# Patient Record
Sex: Female | Born: 1935 | Race: White | Hispanic: No | Smoking: Never smoker
Health system: Southern US, Community
[De-identification: ages and names within clinical notes are randomized; demographics above are authoritative.]

## PROBLEM LIST (undated history)

## (undated) DIAGNOSIS — M1990 Unspecified osteoarthritis, unspecified site: Secondary | ICD-10-CM

## (undated) DIAGNOSIS — K5790 Diverticulosis of intestine, part unspecified, without perforation or abscess without bleeding: Secondary | ICD-10-CM

## (undated) DIAGNOSIS — I10 Essential (primary) hypertension: Secondary | ICD-10-CM

## (undated) DIAGNOSIS — M810 Age-related osteoporosis without current pathological fracture: Secondary | ICD-10-CM

## (undated) DIAGNOSIS — E785 Hyperlipidemia, unspecified: Secondary | ICD-10-CM

## (undated) DIAGNOSIS — K635 Polyp of colon: Secondary | ICD-10-CM

## (undated) HISTORY — DX: Age-related osteoporosis without current pathological fracture: M81.0

## (undated) HISTORY — DX: Polyp of colon: K63.5

## (undated) HISTORY — DX: Essential (primary) hypertension: I10

## (undated) HISTORY — PX: HEMORRHOID SURGERY: SHX153

## (undated) HISTORY — DX: Unspecified osteoarthritis, unspecified site: M19.90

## (undated) HISTORY — DX: Hyperlipidemia, unspecified: E78.5

## (undated) HISTORY — PX: TOTAL ABDOMINAL HYSTERECTOMY: SHX209

## (undated) HISTORY — PX: HEMORROIDECTOMY: SUR656

## (undated) HISTORY — PX: CARPAL TUNNEL RELEASE: SHX101

## (undated) HISTORY — DX: Diverticulosis of intestine, part unspecified, without perforation or abscess without bleeding: K57.90

---

## 1997-08-25 ENCOUNTER — Other Ambulatory Visit: Admission: RE | Admit: 1997-08-25 | Discharge: 1997-08-25 | Payer: Self-pay | Admitting: Family Medicine

## 1999-07-02 ENCOUNTER — Encounter: Payer: Self-pay | Admitting: Family Medicine

## 1999-07-02 ENCOUNTER — Encounter: Admission: RE | Admit: 1999-07-02 | Discharge: 1999-07-02 | Payer: Self-pay | Admitting: Obstetrics and Gynecology

## 2000-02-23 ENCOUNTER — Encounter: Payer: Self-pay | Admitting: Family Medicine

## 2000-02-23 ENCOUNTER — Encounter: Admission: RE | Admit: 2000-02-23 | Discharge: 2000-02-23 | Payer: Self-pay | Admitting: *Deleted

## 2000-03-10 ENCOUNTER — Encounter (INDEPENDENT_AMBULATORY_CARE_PROVIDER_SITE_OTHER): Payer: Self-pay | Admitting: Specialist

## 2000-03-10 ENCOUNTER — Ambulatory Visit (HOSPITAL_BASED_OUTPATIENT_CLINIC_OR_DEPARTMENT_OTHER): Admission: RE | Admit: 2000-03-10 | Discharge: 2000-03-10 | Payer: Self-pay | Admitting: General Surgery

## 2001-01-02 ENCOUNTER — Other Ambulatory Visit: Admission: RE | Admit: 2001-01-02 | Discharge: 2001-01-02 | Payer: Self-pay | Admitting: Family Medicine

## 2001-02-09 ENCOUNTER — Encounter: Payer: Self-pay | Admitting: Family Medicine

## 2001-02-09 ENCOUNTER — Encounter: Admission: RE | Admit: 2001-02-09 | Discharge: 2001-02-09 | Payer: Self-pay | Admitting: Family Medicine

## 2002-02-11 ENCOUNTER — Encounter: Admission: RE | Admit: 2002-02-11 | Discharge: 2002-02-11 | Payer: Self-pay | Admitting: Family Medicine

## 2002-02-11 ENCOUNTER — Encounter: Payer: Self-pay | Admitting: Family Medicine

## 2003-03-06 ENCOUNTER — Encounter: Admission: RE | Admit: 2003-03-06 | Discharge: 2003-03-06 | Payer: Self-pay | Admitting: Family Medicine

## 2004-03-10 ENCOUNTER — Ambulatory Visit (HOSPITAL_COMMUNITY): Admission: RE | Admit: 2004-03-10 | Discharge: 2004-03-10 | Payer: Self-pay | Admitting: Family Medicine

## 2005-03-21 ENCOUNTER — Ambulatory Visit (HOSPITAL_COMMUNITY): Admission: RE | Admit: 2005-03-21 | Discharge: 2005-03-21 | Payer: Self-pay | Admitting: Family Medicine

## 2005-09-02 ENCOUNTER — Ambulatory Visit: Payer: Self-pay | Admitting: Internal Medicine

## 2005-10-06 ENCOUNTER — Encounter (INDEPENDENT_AMBULATORY_CARE_PROVIDER_SITE_OTHER): Payer: Self-pay | Admitting: Specialist

## 2005-10-06 ENCOUNTER — Ambulatory Visit: Payer: Self-pay | Admitting: Internal Medicine

## 2006-04-11 ENCOUNTER — Ambulatory Visit (HOSPITAL_COMMUNITY): Admission: RE | Admit: 2006-04-11 | Discharge: 2006-04-11 | Payer: Self-pay | Admitting: Family Medicine

## 2006-05-04 ENCOUNTER — Ambulatory Visit (HOSPITAL_COMMUNITY): Admission: RE | Admit: 2006-05-04 | Discharge: 2006-05-04 | Payer: Self-pay | Admitting: Ophthalmology

## 2006-10-19 ENCOUNTER — Ambulatory Visit (HOSPITAL_COMMUNITY): Admission: RE | Admit: 2006-10-19 | Discharge: 2006-10-19 | Payer: Self-pay | Admitting: Ophthalmology

## 2007-04-12 ENCOUNTER — Ambulatory Visit (HOSPITAL_COMMUNITY): Admission: RE | Admit: 2007-04-12 | Discharge: 2007-04-12 | Payer: Self-pay | Admitting: Family Medicine

## 2008-04-29 ENCOUNTER — Ambulatory Visit (HOSPITAL_COMMUNITY): Admission: RE | Admit: 2008-04-29 | Discharge: 2008-04-29 | Payer: Self-pay | Admitting: Family Medicine

## 2008-05-01 ENCOUNTER — Ambulatory Visit (HOSPITAL_COMMUNITY): Admission: RE | Admit: 2008-05-01 | Discharge: 2008-05-01 | Payer: Self-pay | Admitting: Family Medicine

## 2009-05-04 ENCOUNTER — Ambulatory Visit (HOSPITAL_COMMUNITY): Admission: RE | Admit: 2009-05-04 | Discharge: 2009-05-04 | Payer: Self-pay | Admitting: Family Medicine

## 2010-05-18 ENCOUNTER — Other Ambulatory Visit: Payer: Self-pay | Admitting: Family Medicine

## 2010-05-18 DIAGNOSIS — Z139 Encounter for screening, unspecified: Secondary | ICD-10-CM

## 2010-06-07 ENCOUNTER — Ambulatory Visit (HOSPITAL_COMMUNITY)
Admission: RE | Admit: 2010-06-07 | Discharge: 2010-06-07 | Disposition: A | Discharge status: Home / Self Care | Payer: Medicare Other | Source: Ambulatory Visit | Attending: Family Medicine | Admitting: Family Medicine

## 2010-06-07 DIAGNOSIS — Z1231 Encounter for screening mammogram for malignant neoplasm of breast: Secondary | ICD-10-CM | POA: Insufficient documentation

## 2010-06-07 DIAGNOSIS — Z139 Encounter for screening, unspecified: Secondary | ICD-10-CM

## 2010-06-18 NOTE — Assessment & Plan Note (Signed)
[facility]                           GASTROENTEROLOGY OFFICE NOTE   NAME:Wendy Robles, Wendy Robles                      MRN:          [id number]  DATE:09/02/2005                            DOB:          [birth year]    CHIEF COMPLAINT:  History of colon polyps, constipation.   ASSESSMENT:  Wendy Robles is a lady that I found an adenomatous polyp and an  inflammatory polyp on a colonoscopy in 2003.  Originally I had recommended a  follow-up colonoscopy in 3 years, which was current recommendation for that  time, but subsequently one polyp only would mean a follow-up in 5 years so I  had changed that, but we had neglected to notify her.  I explained to her  how one of the polyps was inflammatory, I could not remove it but it was  biopsied, and she is reassured about this at this point.   She does have change in bowel habits.  She suffered with chronic  constipation off and on, but things seem to be worse.  It may be her  calcium, it may be her fish oil.  She is on Metamucil and stool softeners  without significant benefit.   PLAN:  We are going to go ahead and proceed with a colonoscopy because of  the change in bowel habits and the history of polyps.  She understands the  risks, benefits, and indication and agrees to proceed.  If it is negative  for polyps, given her age she might need another routine colonoscopy follow-  up depending on her clinical scenario and history at the time.   History as above.  No bleeding.  She has had small, hard stools frequently  that are a problem and make her miserable.  She is on a little bit of  Metamucil.  I have recommended Miralax to her as well, I believe.  Medications are listed and reviewed in the chart.  Past medical history is  reviewed and unchanged from my note of March 2004.   I appreciate the opportunity to care for this patient.                                   Iva Boop, MD, [facility]   CEG/MedQ  DD:   09/02/2005  DT:  09/03/2005  Job #:  [id number]   cc:   Magnus Sinning. Rice, MD

## 2010-06-18 NOTE — Op Note (Signed)
[place]. [facility]  Patient:    Wendy Robles, Wendy Robles                        MRN: [id number] Proc. Date: 03/10/00 Adm. Date:  [id number] Disc. Date: [id number] Attending:  Janalyn Rouse CC:         Montey Hora, M.D.   Operative Report  PREOPERATIVE DIAGNOSIS:  Intraductal papilloma of the right breast.  POSTOPERATIVE DIAGNOSIS:  Intraductal papilloma of the right breast.  OPERATION:  Excision of duct system right breast.  SURGEON:  Rose Phi. Maple Hudson, M.D.  ANESTHESIA:  MAC.  INDICATIONS:  This 77 year old white female had presented with a nipple discharge and, on ductogram, had shown a small, 4 mm filling defect in the duct at the 3 oclock position of the right breast.  One was able to extrude clear fluid on pressure there.  DESCRIPTION OF PROCEDURE:  The patient was placed on the operating table and the right breast prepped and draped in the usual fashion.  A curvilinear incision that was circumareolar in nature was centered around the 3 oclock position and the area infiltrated with 1% Xylocaine with adrenalin.  Incision was made, and I dissected up the subareolar tissue as a flap and then identified the dilated duct coming out at the 3 oclock position.  I divided the duct at the nipple level, and then one could see the papilloma in it, and we then excised this area of the duct system.  Hemostasis was obtained with the cautery.  Subcuticular closure with 4-0 Monocryl and Steri-Strips carried out.  Dressing applied.  The patient transferred to the recovery room in satisfactory condition having tolerated the procedure well. DD:  03/10/00 TD:  03/12/00 Job: [id number]

## 2010-07-28 ENCOUNTER — Encounter: Payer: Self-pay | Admitting: Cardiology

## 2010-07-28 ENCOUNTER — Ambulatory Visit (INDEPENDENT_AMBULATORY_CARE_PROVIDER_SITE_OTHER): Payer: Medicare Other | Admitting: Cardiology

## 2010-07-28 DIAGNOSIS — I10 Essential (primary) hypertension: Secondary | ICD-10-CM

## 2010-07-28 DIAGNOSIS — R9431 Abnormal electrocardiogram [ECG] [EKG]: Secondary | ICD-10-CM

## 2010-07-28 DIAGNOSIS — Z01818 Encounter for other preprocedural examination: Secondary | ICD-10-CM

## 2010-07-28 NOTE — Assessment & Plan Note (Signed)
I have suggested the the abnormal EKG be evaluated with an ETT.  However, the patient is reluctant to have this done secondary to cost.  She will check with Dr. Kathi Der office to see if she can arrange this.  If this is normal I would see no contraindication to surgery.

## 2010-07-28 NOTE — Assessment & Plan Note (Signed)
For surgery would be a low risk procedure and she has no high-risk findings. I have told her that it would be reasonable to do the exercise treadmill test before any operative procedure as above.

## 2010-07-28 NOTE — Assessment & Plan Note (Signed)
Her blood pressure was slightly elevated but a new blood pressure diary and he has otherwise been normal. She will keep this diary and we can treat her blood pressure as needed.

## 2010-07-28 NOTE — Progress Notes (Signed)
HPI The patient presents for evaluation of an abnormal EKG and preoperative prior to possible surgery. She has had no prior cardiac history other than syncope some years ago. She denies any recent cardiovascular symptoms. The patient denies any new symptoms such as chest discomfort, neck or arm discomfort. There has been no new shortness of breath, PND or orthopnea. There have been no reported palpitations, presyncope or syncope.  She is somewhat limited by neuropathy but she does do her household chores and some chair exercises.  No Known Allergies  Current Outpatient Prescriptions  Medication Sig Dispense Refill  . aspirin 81 MG tablet Take 81 mg by mouth daily.        . Calcium Carbonate-Vitamin D (CALTRATE 600+D) 600-400 MG-UNIT per chew tablet Chew 1 tablet by mouth daily.        . Cholecalciferol (VITAMIN D) 2000 UNITS CAPS Take by mouth 2 (two) times daily.        . Multiple Vitamin (MULTIVITAMIN) capsule Take 1 capsule by mouth daily.        Marland Kitchen omega-3 acid ethyl esters (LOVAZA) 1 G capsule Take 2 g by mouth. 4 po daily       . polyethylene glycol (MIRALAX / GLYCOLAX) packet Take 17 g by mouth as needed.        . rosuvastatin (CRESTOR) 20 MG tablet Take 20 mg by mouth. 4 weekly       . zolpidem (AMBIEN) 5 MG tablet Take 5 mg by mouth at bedtime as needed.          Past Medical History  Diagnosis Date  . HTN (hypertension)     White coat  . Dyslipidemia   . Diverticulosis     Past Surgical History  Procedure Date  . Total abdominal hysterectomy   . Hemorroidectomy     Family History  Problem Relation Age of Onset  . Coronary artery disease Father 78    History   Social History  . Marital Status: Widowed    Spouse Name: N/A    Number of Children: 2  . Years of Education: N/A   Occupational History  .     Social History Main Topics  . Smoking status: Never Smoker   . Smokeless tobacco: Not on file  . Alcohol Use: Not on file  . Drug Use: Not on file  . Sexually  Active: Not on file   Other Topics Concern  . Not on file   Social History Narrative  . No narrative on file    ROS:  Neuropathy.  Otherwise as stated in the HPI and negative for all other systems.   PHYSICAL EXAM BP 164/88  Pulse 89  Resp 16  Ht 5\' 6"  (1.676 m)  Wt 181 lb (82.101 kg)  BMI 29.21 kg/m2 GENERAL:  Well appearing HEENT:  Pupils equal round and reactive, fundi not visualized, oral mucosa unremarkable NECK:  No jugular venous distention, waveform within normal limits, carotid upstroke brisk and symmetric, no bruits, no thyromegaly LYMPHATICS:  No cervical, inguinal adenopathy LUNGS:  Clear to auscultation bilaterally BACK:  No CVA tenderness CHEST:  Unremarkable HEART:  PMI not displaced or sustained,S1 and S2 within normal limits, no S3, no S4, no clicks, no rubs, no murmurs ABD:  Flat, positive bowel sounds normal in frequency in pitch, no bruits, no rebound, no guarding, no midline pulsatile mass, no hepatomegaly, no splenomegaly EXT:  2 plus pulses throughout, no edema, no cyanosis no clubbing SKIN:  No rashes no  nodules NEURO:  Cranial nerves II through XII grossly intact, motor grossly intact throughout PSYCH:  Cognitively intact, oriented to person place and time   EKG:  Sinus rhythm, left axis deviation, rate 80, minimal voltage for hypertrophy, poor anterior R wave progression. PACs.  ASSESSMENT AND PLAN

## 2010-07-28 NOTE — Patient Instructions (Signed)
Please follow up as needed. Schedule your treadmill with [facility] when you are ready Continue medications as listed

## 2010-08-02 ENCOUNTER — Telehealth: Payer: Self-pay | Admitting: Cardiology

## 2010-08-02 DIAGNOSIS — R9431 Abnormal electrocardiogram [ECG] [EKG]: Secondary | ICD-10-CM

## 2010-08-02 DIAGNOSIS — Z01810 Encounter for preprocedural cardiovascular examination: Secondary | ICD-10-CM

## 2010-08-02 NOTE — Telephone Encounter (Signed)
Will forward to Dr Antoine Poche for orders

## 2010-08-02 NOTE — Telephone Encounter (Signed)
Pt calling re dr hochrein wanting pt to have a treadmill test and she said there is no way she can walk on a treadmill, she wants to know what else he would want her to do?

## 2010-08-05 NOTE — Telephone Encounter (Signed)
Pt aware of need for further evaluation of abnormal EKG prior to Dr Antoine Poche being able to give her surgical clearance.  She is agreeable to a 2 D Echo but would need it done closer to home as she is unable to drive to [place].  She would prefer it be done in [place] if possible and states any day of the week is fine.  An order will be placed.

## 2010-08-05 NOTE — Telephone Encounter (Signed)
I had hoped to get away with an ETT which would be lowest cost to try to evaluate her abnormal EKG.  I do not want to clear her for surgery without some further cardiac evaluation.  I will accept an echo to make sure that there is no anterior wall motion abnormality to indicate an old anterior MI.  If this was normal I would clear her.

## 2010-08-12 ENCOUNTER — Other Ambulatory Visit (INDEPENDENT_AMBULATORY_CARE_PROVIDER_SITE_OTHER): Payer: Medicare Other | Admitting: *Deleted

## 2010-08-12 DIAGNOSIS — R9431 Abnormal electrocardiogram [ECG] [EKG]: Secondary | ICD-10-CM

## 2010-08-12 DIAGNOSIS — Z01810 Encounter for preprocedural cardiovascular examination: Secondary | ICD-10-CM

## 2010-08-23 ENCOUNTER — Telehealth: Payer: Self-pay | Admitting: Cardiology

## 2010-08-23 ENCOUNTER — Encounter: Payer: Self-pay | Admitting: *Deleted

## 2010-08-23 NOTE — Telephone Encounter (Signed)
Letter of clearance completed and routed to both Dr Ranell Patrick and Dr Christell Constant.

## 2010-08-23 NOTE — Telephone Encounter (Signed)
Pt needs sur clearance for wrist surgery. Fax# [phone].

## 2010-11-10 ENCOUNTER — Encounter: Payer: Self-pay | Admitting: Internal Medicine

## 2011-06-23 ENCOUNTER — Other Ambulatory Visit: Payer: Self-pay | Admitting: Family Medicine

## 2011-06-23 DIAGNOSIS — Z139 Encounter for screening, unspecified: Secondary | ICD-10-CM

## 2011-06-30 ENCOUNTER — Ambulatory Visit (HOSPITAL_COMMUNITY)
Admission: RE | Admit: 2011-06-30 | Discharge: 2011-06-30 | Disposition: A | Discharge status: Home / Self Care | Payer: Medicare Other | Source: Ambulatory Visit | Attending: Family Medicine | Admitting: Family Medicine

## 2011-06-30 DIAGNOSIS — Z139 Encounter for screening, unspecified: Secondary | ICD-10-CM

## 2011-06-30 DIAGNOSIS — Z1231 Encounter for screening mammogram for malignant neoplasm of breast: Secondary | ICD-10-CM | POA: Insufficient documentation

## 2011-10-26 ENCOUNTER — Encounter: Payer: Self-pay | Admitting: Internal Medicine

## 2012-04-17 ENCOUNTER — Other Ambulatory Visit: Payer: Self-pay | Admitting: Family Medicine

## 2012-04-18 LAB — FECAL OCCULT BLOOD, IMMUNOCHEMICAL: Fecal Occult Blood: NEGATIVE

## 2012-08-23 ENCOUNTER — Other Ambulatory Visit: Payer: Self-pay | Admitting: Family Medicine

## 2012-08-23 DIAGNOSIS — Z139 Encounter for screening, unspecified: Secondary | ICD-10-CM

## 2012-08-27 ENCOUNTER — Ambulatory Visit (INDEPENDENT_AMBULATORY_CARE_PROVIDER_SITE_OTHER): Payer: Medicare Other | Admitting: Family Medicine

## 2012-08-27 ENCOUNTER — Ambulatory Visit (INDEPENDENT_AMBULATORY_CARE_PROVIDER_SITE_OTHER): Payer: Medicare Other

## 2012-08-27 ENCOUNTER — Ambulatory Visit (HOSPITAL_COMMUNITY)
Admission: RE | Admit: 2012-08-27 | Discharge: 2012-08-27 | Disposition: A | Discharge status: Home / Self Care | Payer: Medicare Other | Source: Ambulatory Visit | Attending: Family Medicine | Admitting: Family Medicine

## 2012-08-27 VITALS — BP 163/84 | HR 86 | Temp 98.8°F | Ht 66.0 in | Wt 165.0 lb

## 2012-08-27 DIAGNOSIS — Z1231 Encounter for screening mammogram for malignant neoplasm of breast: Secondary | ICD-10-CM | POA: Insufficient documentation

## 2012-08-27 DIAGNOSIS — M25572 Pain in left ankle and joints of left foot: Secondary | ICD-10-CM

## 2012-08-27 DIAGNOSIS — Z139 Encounter for screening, unspecified: Secondary | ICD-10-CM

## 2012-08-27 DIAGNOSIS — M25579 Pain in unspecified ankle and joints of unspecified foot: Secondary | ICD-10-CM

## 2012-08-27 LAB — POCT CBC
Hemoglobin: 13.9 g/dL (ref 12.2–16.2)
MCH, POC: 30.3 pg (ref 27–31.2)
MCHC: 34.2 g/dL (ref 31.8–35.4)
MCV: 88.7 fL (ref 80–97)
MPV: 7.5 fL (ref 0–99.8)
POC Granulocyte: 3.1 (ref 2–6.9)
POC LYMPH PERCENT: 34.7 %L (ref 10–50)
RBC: 4.6 M/uL (ref 4.04–5.48)

## 2012-08-27 NOTE — Progress Notes (Signed)
  Subjective:    Patient ID: Wendy Robles, female    DOB: July 10, 1929, 79 y.o.   MRN: [id number]  HPI She has noticed the left ankle swelling for 3 days. There was no history of any injury. He has been feverish and tender for 24-hour. She has had a history of neuropathy in both legs since 2009.   Review of Systems  Respiratory: Negative.   Cardiovascular: Negative.   Gastrointestinal: Negative.        Except for constipation for which she takes MiraLax  Genitourinary: Negative.   Neurological: Positive for numbness (right hand secondary to carpal tunnel she also has numbness in both the).       Objective:   Physical Exam  Constitutional: She is oriented to person, place, and time. She appears well-developed and well-nourished. No distress.  HENT:  Head: Normocephalic.  Musculoskeletal: Normal range of motion. She exhibits edema (slight medial malleolus edema and rubor) and tenderness (tender below the medial malleolus).  Neurological: She is alert and oriented to person, place, and time.  Skin: Skin is warm. No rash noted. She is not diaphoretic. There is erythema (slight erythema over the medial malleolus left). No pallor.  Psychiatric: She has a normal mood and affect. Her behavior is normal. Judgment and thought content normal.   [facility] reading (PRIMARY) by  Dr. Christell Constant: Left ankle: Within normal limits, heel spur                                 Results for orders placed in visit on 08/27/12  POCT CBC      Result Value Range   WBC 5.7  4.6 - 10.2 K/uL   Lymph, poc 2.0  0.6 - 3.4   POC LYMPH PERCENT 34.7  10 - 50 %L   MID (cbc)    0 - 0.9   POC MID %    0 - 12 %M   POC Granulocyte 3.1  2 - 6.9   Granulocyte percent 54.6  37 - 80 %G   RBC 4.6  4.04 - 5.48 M/uL   Hemoglobin 13.9  12.2 - 16.2 g/dL   HCT, POC 78.2  95.6 - 47.9 %   MCV 88.7  80 - 97 fL   MCH, POC 30.3  27 - 31.2 pg   MCHC 34.2  31.8 - 35.4 g/dL   RDW, POC 21.3     Platelet Count, POC 185.0  142 - 424 K/uL   MPV 7.5  0 - 99.8 fL   Patient aware of x-ray report and CBC before she left the office       Assessment & Plan:  1. Pain in joint, ankle and foot, left - Uric acid - DG Ankle Complete Left; Future - POCT CBC  Patient Instructions  Elevate foot and use warm wet compresses Avoids excessive sodium intake   Nyra Capes MD

## 2012-08-27 NOTE — Patient Instructions (Addendum)
Elevate foot and use warm wet compresses Avoids excessive sodium intake

## 2012-09-16 ENCOUNTER — Other Ambulatory Visit: Payer: Self-pay | Admitting: Nurse Practitioner

## 2012-09-18 ENCOUNTER — Telehealth: Payer: Self-pay | Admitting: Nurse Practitioner

## 2012-09-18 NOTE — Telephone Encounter (Signed)
Last seen 08/27/12  DWM  If approved route to nurse to phone in

## 2012-09-18 NOTE — Telephone Encounter (Signed)
RX REQUEST SENT TO DR. Christell Constant BY DEBBIE BOLES.

## 2012-09-20 ENCOUNTER — Other Ambulatory Visit: Payer: Self-pay | Admitting: Nurse Practitioner

## 2012-10-17 ENCOUNTER — Encounter: Payer: Self-pay | Admitting: Nurse Practitioner

## 2012-10-17 ENCOUNTER — Ambulatory Visit (INDEPENDENT_AMBULATORY_CARE_PROVIDER_SITE_OTHER): Payer: Medicare Other | Admitting: Nurse Practitioner

## 2012-10-17 VITALS — BP 177/86 | HR 77 | Temp 97.1°F | Ht 66.0 in | Wt 180.0 lb

## 2012-10-17 DIAGNOSIS — E785 Hyperlipidemia, unspecified: Secondary | ICD-10-CM | POA: Insufficient documentation

## 2012-10-17 DIAGNOSIS — G4700 Insomnia, unspecified: Secondary | ICD-10-CM | POA: Insufficient documentation

## 2012-10-17 MED ORDER — ZOLPIDEM TARTRATE 5 MG PO TABS: 5.0000 mg | ORAL_TABLET | Freq: Every evening | ORAL | Status: DC | PRN

## 2012-10-17 MED ORDER — OMEGA-3-ACID ETHYL ESTERS 1 G PO CAPS: 2.0000 g | ORAL_CAPSULE | Freq: Two times a day (BID) | ORAL | Status: DC

## 2012-10-17 NOTE — Patient Instructions (Signed)

## 2012-10-17 NOTE — Progress Notes (Signed)
  Subjective:    Patient ID: Wendy Robles, female    DOB: [birth year], 79 y.o.   MRN: [id number]  Hyperlipidemia This is a chronic problem. The current episode started more than 1 year ago. The problem is controlled. Recent lipid tests were reviewed and are normal. She has no history of diabetes, hypothyroidism, obesity or nephrotic syndrome. There are no known factors aggravating her hyperlipidemia. Pertinent negatives include no chest pain, focal sensory loss, focal weakness, leg pain, myalgias or shortness of breath. Current antihyperlipidemic treatment includes statins. The current treatment provides moderate improvement of lipids. There are no compliance problems.  Risk factors for coronary artery disease include family history and post-menopausal.  Insomnia ambien 5 mg - works well. Can't rest without it   Review of Systems  Respiratory: Negative for shortness of breath.   Cardiovascular: Negative for chest pain.  Musculoskeletal: Negative for myalgias.  Neurological: Negative for focal weakness.  All other systems reviewed and are negative.       Objective:   Physical Exam  Constitutional: She is oriented to person, place, and time. She appears well-developed and well-nourished.  HENT:  Nose: Nose normal.  Mouth/Throat: Oropharynx is clear and moist.  Eyes: EOM are normal.  Neck: Trachea normal, normal range of motion and full passive range of motion without pain. Neck supple. No JVD present. Carotid bruit is not present. No thyromegaly present.  Cardiovascular: Normal rate, regular rhythm, normal heart sounds and intact distal pulses.  Exam reveals no gallop and no friction rub.   No murmur heard. Pulmonary/Chest: Effort normal and breath sounds normal.  Abdominal: Soft. Bowel sounds are normal. She exhibits no distension and no mass. There is no tenderness.  Musculoskeletal: Normal range of motion.  Lymphadenopathy:    She has no cervical adenopathy.  Neurological: She is  alert and oriented to person, place, and time. She has normal reflexes.  Skin: Skin is warm and dry.  Psychiatric: She has a normal mood and affect. Her behavior is normal. Judgment and thought content normal.  Tearful today (son has cancer)    BP 177/86  Pulse 77  Temp(Src) 97.1 F (36.2 C) (Oral)  Ht 5\' 6"  (1.676 m)  Wt 180 lb (81.647 kg)  BMI 29.07 kg/m2       Assessment & Plan:  1. Insomnia Bedtime rittual - zolpidem (AMBIEN) 5 MG tablet; Take 1 tablet (5 mg total) by mouth at bedtime as needed for sleep.  Dispense: 30 tablet; Refill: 1  2. Hyperlipidemia LDL goal < 100 Low fat diet and exercsie - CMP14+EGFR - NMR, lipoprofile Continue crestor 10 mg daily - omega-3 acid ethyl esters (LOVAZA) 1 G capsule; Take 2 capsules (2 g total) by mouth 2 (two) times daily.  Dispense: 120 capsule; Refill: 5  Mary-Margaret Daphine Deutscher, FNP

## 2012-10-19 LAB — CMP14+EGFR
Albumin/Globulin Ratio: 2.2 (ref 1.1–2.5)
BUN/Creatinine Ratio: 23 (ref 11–26)
BUN: 18 mg/dL (ref 8–27)
CO2: 30 mmol/L — ABNORMAL HIGH (ref 18–29)
Calcium: 10.3 mg/dL — ABNORMAL HIGH (ref 8.6–10.2)
GFR calc Af Amer: 80 mL/min/{1.73_m2} (ref >59)
Globulin, Total: 2.1 g/dL (ref 1.5–4.5)
Glucose: 111 mg/dL — ABNORMAL HIGH (ref 65–99)
Total Bilirubin: 0.4 mg/dL (ref 0.0–1.2)

## 2012-10-19 LAB — NMR, LIPOPROFILE
HDL Cholesterol by NMR: 53 mg/dL (ref >=40)
HDL Particle Number: 35.8 umol/L (ref >=30.5)
LDLC SERPL CALC-MCNC: 96 mg/dL (ref <100)
Small LDL Particle Number: 1255 nmol/L — ABNORMAL HIGH (ref <=527)
Triglycerides by NMR: 134 mg/dL (ref <150)

## 2012-11-22 ENCOUNTER — Telehealth: Payer: Self-pay | Admitting: Nurse Practitioner

## 2012-11-22 NOTE — Telephone Encounter (Signed)
[facility]

## 2012-12-06 ENCOUNTER — Other Ambulatory Visit: Payer: Self-pay

## 2013-01-08 ENCOUNTER — Other Ambulatory Visit: Payer: Self-pay | Admitting: Nurse Practitioner

## 2013-01-11 NOTE — Telephone Encounter (Signed)
Last seen 10/17/12 MMM If approved route to nurse to call into [facility]

## 2013-01-11 NOTE — Telephone Encounter (Signed)
Please call in ambien rx with 1 refill 

## 2013-01-11 NOTE — Telephone Encounter (Signed)
Called into pharmacy

## 2013-01-14 ENCOUNTER — Telehealth: Payer: Self-pay | Admitting: Nurse Practitioner

## 2013-04-16 ENCOUNTER — Other Ambulatory Visit: Payer: Self-pay | Admitting: Nurse Practitioner

## 2013-04-16 ENCOUNTER — Ambulatory Visit (INDEPENDENT_AMBULATORY_CARE_PROVIDER_SITE_OTHER): Payer: Medicare HMO | Admitting: Nurse Practitioner

## 2013-04-16 ENCOUNTER — Ambulatory Visit (INDEPENDENT_AMBULATORY_CARE_PROVIDER_SITE_OTHER): Payer: Medicare HMO

## 2013-04-16 ENCOUNTER — Encounter: Payer: Self-pay | Admitting: Nurse Practitioner

## 2013-04-16 VITALS — BP 183/90 | HR 83 | Temp 97.5°F | Ht 66.0 in | Wt 184.4 lb

## 2013-04-16 DIAGNOSIS — M25569 Pain in unspecified knee: Secondary | ICD-10-CM

## 2013-04-16 DIAGNOSIS — M25561 Pain in right knee: Secondary | ICD-10-CM

## 2013-04-16 DIAGNOSIS — G4700 Insomnia, unspecified: Secondary | ICD-10-CM

## 2013-04-16 DIAGNOSIS — E785 Hyperlipidemia, unspecified: Secondary | ICD-10-CM

## 2013-04-16 MED ORDER — TRAMADOL HCL 50 MG PO TABS: 50.0000 mg | ORAL_TABLET | Freq: Three times a day (TID) | ORAL | Status: DC | PRN

## 2013-04-16 NOTE — Patient Instructions (Signed)
Knee Pain Knee pain can be a result of an injury or other medical conditions. Treatment will depend on the cause of your pain. HOME CARE  Only take medicine as told by your doctor.  Keep a healthy weight. Being overweight can make the knee hurt more.  Stretch before exercising or playing sports.  If there is constant knee pain, change the way you exercise. Ask your doctor for advice.  Make sure shoes fit well. Choose the right shoe for the sport or activity.  Protect your knees. Wear kneepads if needed.  Rest when you are tired. GET HELP RIGHT AWAY IF:   Your knee pain does not stop.  Your knee pain does not get better.  Your knee joint feels hot to the touch.  You have a fever. MAKE SURE YOU:   Understand these instructions.  Will watch this condition.  Will get help right away if you are not doing well or get worse. Document Released: 04/15/2008 Document Revised: 04/11/2011 Document Reviewed: 04/15/2008 Exit[employer] Patient Information 2014 [employer].  

## 2013-04-16 NOTE — Progress Notes (Signed)
Subjective:    Patient ID: Wendy Robles, female    DOB: 1929/11/14, 80 y.o.   MRN: [id number]  Patient presents today for follow up of chronic medical problems. Is also complaining of R. Leg pain with severe pain in the knee. Pain began several weeks ago. Pain is intermittent and rates pain 9/10. Denies numbness or tingling, injury, or fall. Has history of neuropathy. Has taken Tylenol but provides mild relief. Patient states pain starts in R. Knee and will shoot up to R. Hip.   Hyperlipidemia This is a chronic problem. The current episode started more than 1 year ago. The problem is controlled. Recent lipid tests were reviewed and are normal. She has no history of diabetes, hypothyroidism, obesity or nephrotic syndrome. There are no known factors aggravating her hyperlipidemia. Pertinent negatives include no chest pain, focal sensory loss, focal weakness, leg pain, myalgias or shortness of breath. Current antihyperlipidemic treatment includes statins. The current treatment provides moderate improvement of lipids. There are no compliance problems.  Risk factors for coronary artery disease include family history and post-menopausal.  Insomnia Ambien 5 mg - states this is no longer working for her. Has difficulty initiating sleep and if she does go to sleep, she will wake up after several hours. Has been trying to limit pills to 3x a week but is now waking up due to pain. AMbien works well when she is not hurting   Review of Systems  Constitutional: Negative for fatigue and unexpected weight change.  Respiratory: Negative for shortness of breath.   Cardiovascular: Negative for chest pain and palpitations.  Gastrointestinal: Negative for diarrhea and constipation.  Musculoskeletal: Negative for myalgias.       R. Knee pain   Neurological: Negative for focal weakness.  Psychiatric/Behavioral: Positive for sleep disturbance.  All other systems reviewed and are negative.       Objective:   Physical Exam  Constitutional: She is oriented to person, place, and time. She appears well-developed and well-nourished.  HENT:  Right Ear: External ear normal.  Left Ear: External ear normal.  Nose: Nose normal.  Mouth/Throat: Oropharynx is clear and moist.  Eyes: EOM are normal.  Neck: Trachea normal, normal range of motion and full passive range of motion without pain. Neck supple. No JVD present. Carotid bruit is not present. No thyromegaly present.  Cardiovascular: Normal rate, regular rhythm, normal heart sounds and intact distal pulses.  Exam reveals no gallop and no friction rub.   No murmur heard. Pulmonary/Chest: Effort normal and breath sounds normal.  Abdominal: Soft. Bowel sounds are normal. She exhibits no distension and no mass. There is no tenderness.  Musculoskeletal: Normal range of motion.  Right knee FROM without pain- no effusion- no patella tenderness  Lymphadenopathy:    She has no cervical adenopathy.  Neurological: She is alert and oriented to person, place, and time. She has normal reflexes.  Skin: Skin is warm and dry.  Psychiatric: She has a normal mood and affect. Her behavior is normal. Judgment and thought content normal.    BP 183/90  Pulse 83  Temp(Src) 97.5 F (36.4 C) (Oral)  Ht $R'5\' 6"'or$  (1.676 m)  Wt 184 lb 6.4 oz (83.643 kg)  BMI 29.78 kg/m2  Right knee x ray- osteoarthritis - Preliminary reading by Ronnald Collum, FNP  [facility]      Assessment & Plan:   1. Insomnia   2. Hyperlipidemia LDL goal < 100   3. Right knee pain     Orders Placed  This Encounter  Procedures  . NMR, lipoprofile  . CMP14+EGFR  . Ambulatory referral to Orthopedic Surgery    Referral Priority:  Routine    Referral Type:  Surgical    Referral Reason:  Specialty Services Required    Referred to Provider:  Augustin Schooling, MD    Requested Specialty:  Orthopedic Surgery    Number of Visits Requested:  1    Meds ordered this encounter  Medications  . traMADol (ULTRAM)  50 MG tablet    Sig: Take 1 tablet (50 mg total) by mouth every 8 (eight) hours as needed.    Dispense:  40 tablet    Refill:  0    Order Specific Question:  Supervising Provider    Answer:  Chipper Herb [[id number]]   Sedation precautions with ultram- do not take ambien with ultram Labs pending Health maintenance reviewed Diet and exercise encouraged Continue all meds Follow up  In 3 months   Malcolm, FNP

## 2013-04-18 LAB — CMP14+EGFR
ALBUMIN: 4.5 g/dL (ref 3.5–4.7)
ALT: 28 IU/L (ref 0–32)
AST: 25 IU/L (ref 0–40)
Albumin/Globulin Ratio: 2 (ref 1.1–2.5)
Alkaline Phosphatase: 44 IU/L (ref 39–117)
BUN / CREAT RATIO: 24 (ref 11–26)
BUN: 18 mg/dL (ref 8–27)
CHLORIDE: 102 mmol/L (ref 97–108)
CO2: 27 mmol/L (ref 18–29)
Calcium: 9.7 mg/dL (ref 8.7–10.3)
Creatinine, Ser: 0.76 mg/dL (ref 0.57–1.00)
GFR calc non Af Amer: 74 mL/min/{1.73_m2} (ref >59)
GFR, EST AFRICAN AMERICAN: 85 mL/min/{1.73_m2} (ref >59)
GLUCOSE: 92 mg/dL (ref 65–99)
Globulin, Total: 2.3 g/dL (ref 1.5–4.5)
POTASSIUM: 4 mmol/L (ref 3.5–5.2)
SODIUM: 144 mmol/L (ref 134–144)
Total Bilirubin: 0.4 mg/dL (ref 0.0–1.2)
Total Protein: 6.8 g/dL (ref 6.0–8.5)

## 2013-04-18 LAB — NMR, LIPOPROFILE
CHOLESTEROL: 148 mg/dL (ref <200)
HDL CHOLESTEROL BY NMR: 47 mg/dL (ref >=40)
HDL PARTICLE NUMBER: 33.3 umol/L (ref >=30.5)
LDL PARTICLE NUMBER: 1326 nmol/L — AB (ref <1000)
LDL Size: 19.9 nm — ABNORMAL LOW (ref >20.5)
LDLC SERPL CALC-MCNC: 68 mg/dL (ref <100)
LP-IR Score: 42 (ref <=45)
Small LDL Particle Number: 910 nmol/L — ABNORMAL HIGH (ref <=527)
Triglycerides by NMR: 165 mg/dL — ABNORMAL HIGH (ref <150)

## 2013-05-13 ENCOUNTER — Other Ambulatory Visit: Payer: Self-pay | Admitting: *Deleted

## 2013-05-13 DIAGNOSIS — E785 Hyperlipidemia, unspecified: Secondary | ICD-10-CM

## 2013-05-13 MED ORDER — OMEGA-3-ACID ETHYL ESTERS 1 G PO CAPS: 2.0000 g | ORAL_CAPSULE | Freq: Two times a day (BID) | ORAL | Status: DC

## 2013-05-31 ENCOUNTER — Encounter: Payer: Self-pay | Admitting: *Deleted

## 2013-07-01 ENCOUNTER — Telehealth: Payer: Self-pay | Admitting: Nurse Practitioner

## 2013-07-01 MED ORDER — ZOLPIDEM TARTRATE 5 MG PO TABS: 5.0000 mg | ORAL_TABLET | Freq: Every evening | ORAL | Status: DC | PRN

## 2013-07-01 NOTE — Telephone Encounter (Signed)
Please call in ambien 5mg  1 po Qhs #30 with 1 refills

## 2013-07-01 NOTE — Telephone Encounter (Signed)
Last seen 04/16/13, last filled 01/08/13 with 1 RF. Call into [facility]

## 2013-07-01 NOTE — Telephone Encounter (Signed)
Called in.

## 2013-07-23 ENCOUNTER — Ambulatory Visit: Payer: Medicare HMO | Admitting: Nurse Practitioner

## 2013-07-24 ENCOUNTER — Ambulatory Visit (INDEPENDENT_AMBULATORY_CARE_PROVIDER_SITE_OTHER): Payer: Medicare HMO | Admitting: Nurse Practitioner

## 2013-07-24 ENCOUNTER — Telehealth: Payer: Self-pay | Admitting: Nurse Practitioner

## 2013-07-24 ENCOUNTER — Encounter: Payer: Self-pay | Admitting: Nurse Practitioner

## 2013-07-24 VITALS — BP 150/79 | HR 79 | Temp 98.4°F | Ht 66.0 in | Wt 187.4 lb

## 2013-07-24 DIAGNOSIS — Z23 Encounter for immunization: Secondary | ICD-10-CM

## 2013-07-24 DIAGNOSIS — G4700 Insomnia, unspecified: Secondary | ICD-10-CM

## 2013-07-24 DIAGNOSIS — E785 Hyperlipidemia, unspecified: Secondary | ICD-10-CM

## 2013-07-24 MED ORDER — ATORVASTATIN CALCIUM 40 MG PO TABS: 40.0000 mg | ORAL_TABLET | Freq: Every day | ORAL | Status: DC

## 2013-07-24 NOTE — Progress Notes (Signed)
Subjective:    Patient ID: Wendy Robles, female    DOB: [birth year], 80 y.o.   MRN: [id number]  Patient here today for follow up of chronic medical problems.  Hyperlipidemia This is a chronic problem. The current episode started more than 1 year ago. The problem is controlled. Recent lipid tests were reviewed and are normal. She has no history of diabetes, hypothyroidism, obesity or nephrotic syndrome. There are no known factors aggravating her hyperlipidemia. Pertinent negatives include no chest pain, focal sensory loss, focal weakness, leg pain, myalgias or shortness of breath. Current antihyperlipidemic treatment includes statins. The current treatment provides moderate improvement of lipids. There are no compliance problems.  Risk factors for coronary artery disease include family history and post-menopausal.  Insomnia Ambien 5 mg - states this is no longer working for her. Has difficulty initiating sleep and if she does go to sleep, she will wake up after several hours. Has been trying to limit pills to 3x a week but is now waking up due to pain. AMbien works well when she is not hurting   Review of Systems  Constitutional: Negative for fatigue and unexpected weight change.  Respiratory: Negative for shortness of breath.   Cardiovascular: Negative for chest pain and palpitations.  Gastrointestinal: Negative for diarrhea and constipation.  Musculoskeletal: Negative for myalgias.       R. Knee pain   Neurological: Negative for focal weakness.  Psychiatric/Behavioral: Positive for sleep disturbance.  All other systems reviewed and are negative.      Objective:   Physical Exam  Constitutional: She is oriented to person, place, and time. She appears well-developed and well-nourished.  HENT:  Right Ear: External ear normal.  Left Ear: External ear normal.  Nose: Nose normal.  Mouth/Throat: Oropharynx is clear and moist.  Eyes: EOM are normal.  Neck: Trachea normal, normal range of  motion and full passive range of motion without pain. Neck supple. No JVD present. Carotid bruit is not present. No thyromegaly present.  Cardiovascular: Normal rate, regular rhythm, normal heart sounds and intact distal pulses.  Exam reveals no gallop and no friction rub.   No murmur heard. Pulmonary/Chest: Effort normal and breath sounds normal.  Abdominal: Soft. Bowel sounds are normal. She exhibits no distension and no mass. There is no tenderness.  Musculoskeletal: Normal range of motion.  Right knee FROM without pain- no effusion- no patella tenderness  Lymphadenopathy:    She has no cervical adenopathy.  Neurological: She is alert and oriented to person, place, and time. She has normal reflexes.  Skin: Skin is warm and dry.  Psychiatric: She has a normal mood and affect. Her behavior is normal. Judgment and thought content normal.    BP 150/79  Pulse 79  Temp(Src) 98.4 F (36.9 C) (Oral)  Ht $R'5\' 6"'lV$  (1.676 m)  Wt 187 lb 6.4 oz (85.004 kg)  BMI 30.26 kg/m2        Assessment & Plan:   1. Insomnia   2. Hyperlipidemia with target LDL less than 100     Meds ordered this encounter  Medications  . atorvastatin (LIPITOR) 40 MG tablet    Sig: Take 1 tablet (40 mg total) by mouth daily.    Dispense:  30 tablet    Refill:  5    Order Specific Question:  Supervising Valiant Dills    Answer:  Chipper Herb [[id number]]   Orders Placed This Encounter  Procedures  . CMP14+EGFR  . NMR, lipoprofile    Labs pending  Health maintenance reviewed Diet and exercise encouraged Continue all meds Follow up  In 3 months   [place], FNP

## 2013-07-24 NOTE — Patient Instructions (Signed)

## 2013-07-24 NOTE — Telephone Encounter (Signed)
Explained that you can't directly compare the strength of two different medications and that both Lipitor 40 and Crestor 10 are about medium strength cholesterol medications. Patient stated understanding.

## 2013-07-25 ENCOUNTER — Other Ambulatory Visit: Payer: Self-pay | Admitting: Nurse Practitioner

## 2013-07-25 LAB — CMP14+EGFR
A/G RATIO: 2 (ref 1.1–2.5)
ALBUMIN: 4.5 g/dL (ref 3.5–4.7)
ALT: 28 IU/L (ref 0–32)
AST: 30 IU/L (ref 0–40)
Alkaline Phosphatase: 47 IU/L (ref 39–117)
BILIRUBIN TOTAL: 0.4 mg/dL (ref 0.0–1.2)
BUN/Creatinine Ratio: 20 (ref 11–26)
BUN: 15 mg/dL (ref 8–27)
CO2: 27 mmol/L (ref 18–29)
Calcium: 10.2 mg/dL (ref 8.7–10.3)
Chloride: 103 mmol/L (ref 97–108)
Creatinine, Ser: 0.75 mg/dL (ref 0.57–1.00)
GFR, EST AFRICAN AMERICAN: 86 mL/min/{1.73_m2} (ref >59)
GFR, EST NON AFRICAN AMERICAN: 74 mL/min/{1.73_m2} (ref >59)
Globulin, Total: 2.2 g/dL (ref 1.5–4.5)
Glucose: 98 mg/dL (ref 65–99)
POTASSIUM: 4.2 mmol/L (ref 3.5–5.2)
Sodium: 144 mmol/L (ref 134–144)
TOTAL PROTEIN: 6.7 g/dL (ref 6.0–8.5)

## 2013-07-25 LAB — NMR, LIPOPROFILE
CHOLESTEROL: 147 mg/dL (ref 100–199)
HDL Cholesterol by NMR: 45 mg/dL (ref >39)
HDL PARTICLE NUMBER: 33.5 umol/L (ref >=30.5)
LDL Particle Number: 1135 nmol/L — ABNORMAL HIGH (ref <1000)
LDL Size: 20.2 nm (ref >20.5)
LDLC SERPL CALC-MCNC: 71 mg/dL (ref 0–99)
LP-IR Score: 55 — ABNORMAL HIGH (ref <=45)
SMALL LDL PARTICLE NUMBER: 677 nmol/L — AB (ref <=527)
Triglycerides by NMR: 155 mg/dL — ABNORMAL HIGH (ref 0–149)

## 2013-07-26 NOTE — Telephone Encounter (Signed)
Last seen 07/24/13  MMM This med not on EPIC list  If approved route to nurse to call into [facility]

## 2013-07-28 NOTE — Telephone Encounter (Signed)
Please call in [place] with 1p refills

## 2013-07-29 NOTE — Telephone Encounter (Signed)
Called into [facility]

## 2013-10-16 ENCOUNTER — Ambulatory Visit (INDEPENDENT_AMBULATORY_CARE_PROVIDER_SITE_OTHER): Payer: Medicare HMO | Admitting: Nurse Practitioner

## 2013-10-16 ENCOUNTER — Encounter: Payer: Self-pay | Admitting: Nurse Practitioner

## 2013-10-16 VITALS — BP 135/84 | HR 106 | Temp 97.7°F

## 2013-10-16 DIAGNOSIS — R300 Dysuria: Secondary | ICD-10-CM

## 2013-10-16 DIAGNOSIS — N390 Urinary tract infection, site not specified: Secondary | ICD-10-CM | POA: Insufficient documentation

## 2013-10-16 LAB — POCT UA - MICROSCOPIC ONLY
CASTS, UR, LPF, POC: NEGATIVE
CRYSTALS, UR, HPF, POC: NEGATIVE
Mucus, UA: NEGATIVE
Yeast, UA: NEGATIVE

## 2013-10-16 LAB — POCT URINALYSIS DIPSTICK
Bilirubin, UA: NEGATIVE
Glucose, UA: NEGATIVE
Ketones, UA: NEGATIVE
Nitrite, UA: NEGATIVE
Spec Grav, UA: 1.01
UROBILINOGEN UA: NEGATIVE
pH, UA: 8

## 2013-10-16 MED ORDER — SULFAMETHOXAZOLE-TMP DS 800-160 MG PO TABS: 1.0000 | ORAL_TABLET | Freq: Two times a day (BID) | ORAL | Status: DC

## 2013-10-16 NOTE — Patient Instructions (Signed)

## 2013-10-16 NOTE — Progress Notes (Signed)
   Subjective:    Patient ID: Wendy Robles, female    DOB: [birth year], 80 y.o.   MRN: [id number]  Urinary Tract Infection  This is a new problem. The current episode started 1 to 4 weeks ago. The problem occurs every urination. The quality of the pain is described as burning. The patient is experiencing no pain. There has been no fever. There is no history of pyelonephritis. Associated symptoms include frequency, hematuria and urgency. Pertinent negatives include no discharge. Treatments tried: Monostat.  There is no history of catheterization, kidney stones or recurrent UTIs.      Review of Systems  HENT: Negative.   Eyes: Negative.   Respiratory: Negative.   Cardiovascular: Negative.   Gastrointestinal: Negative.   Endocrine: Negative.   Genitourinary: Positive for urgency, frequency and hematuria.  Musculoskeletal: Negative.   Skin: Negative.   Allergic/Immunologic: Negative.   Neurological: Negative.   Hematological: Negative.   Psychiatric/Behavioral: Negative.        Objective:   Physical Exam  Constitutional: She is oriented to person, place, and time. She appears well-developed.  HENT:  Head: Normocephalic.  Eyes: Pupils are equal, round, and reactive to light.  Neck: Normal range of motion.  Cardiovascular: Normal rate and regular rhythm.   Pulmonary/Chest: Effort normal and breath sounds normal.  Abdominal: Soft.  Musculoskeletal: Normal range of motion.  Neurological: She is alert and oriented to person, place, and time.  Skin: Skin is warm.  Psychiatric: She has a normal mood and affect.    BP 135/84  Pulse 106  Temp(Src) 97.7 F (36.5 C) (Oral)  Results for orders placed in visit on 10/16/13  POCT UA - MICROSCOPIC ONLY      Result Value Ref Range   WBC, Ur, HPF, POC tntc     RBC, urine, microscopic tntc     Bacteria, U Microscopic few     Mucus, UA negative     Epithelial cells, urine per micros few     Crystals, Ur, HPF, POC negative     Casts,  Ur, LPF, POC negative     Yeast, UA negative    POCT URINALYSIS DIPSTICK      Result Value Ref Range   Color, UA gold     Clarity, UA cloudy     Glucose, UA negative     Bilirubin, UA negative     Ketones, UA negative     Spec Grav, UA 1.010     Blood, UA large     pH, UA 8.0     Protein, UA 4+     Urobilinogen, UA negative     Nitrite, UA negative     Leukocytes, UA large (3+)          Assessment & Plan:    1. Dysuria   2. Urinary tract infection, site not specified    Meds ordered this encounter  Medications  . sulfamethoxazole-trimethoprim (BACTRIM DS) 800-160 MG per tablet    Sig: Take 1 tablet by mouth 2 (two) times daily.    Dispense:  20 tablet    Refill:  0    Order Specific Question:  Supervising Provider    Answer:  Joycelyn Man   Force fluids AZO over the counter X2 days RTO prn Culture pending Mary-Margaret Hassell Done, FNP

## 2013-10-18 LAB — URINE CULTURE

## 2013-10-21 ENCOUNTER — Ambulatory Visit: Payer: Medicare HMO | Admitting: Nurse Practitioner

## 2013-10-24 ENCOUNTER — Ambulatory Visit (INDEPENDENT_AMBULATORY_CARE_PROVIDER_SITE_OTHER): Payer: Medicare HMO | Admitting: Nurse Practitioner

## 2013-10-24 ENCOUNTER — Encounter: Payer: Self-pay | Admitting: Nurse Practitioner

## 2013-10-24 VITALS — BP 130/76 | HR 95 | Temp 98.3°F | Ht 66.0 in | Wt 180.0 lb

## 2013-10-24 DIAGNOSIS — G4700 Insomnia, unspecified: Secondary | ICD-10-CM

## 2013-10-24 DIAGNOSIS — Z13820 Encounter for screening for osteoporosis: Secondary | ICD-10-CM

## 2013-10-24 DIAGNOSIS — E785 Hyperlipidemia, unspecified: Secondary | ICD-10-CM

## 2013-10-24 DIAGNOSIS — Z6829 Body mass index (BMI) 29.0-29.9, adult: Secondary | ICD-10-CM

## 2013-10-24 MED ORDER — ZOLPIDEM TARTRATE 5 MG PO TABS: ORAL_TABLET | ORAL | Status: DC

## 2013-10-24 MED ORDER — OMEGA-3-ACID ETHYL ESTERS 1 G PO CAPS
2.0000 g | ORAL_CAPSULE | Freq: Two times a day (BID) | ORAL | Status: DC
Start: 2013-10-24 — End: 2013-11-21

## 2013-10-24 NOTE — Progress Notes (Signed)
Subjective:    Patient ID: Wendy Robles, female    DOB: 06/21/1929, 80 y.o.   MRN: [id number]  Patient here today for follow up of chronic medical problems.  Hyperlipidemia This is a chronic problem. The current episode started more than 1 year ago. The problem is controlled. Recent lipid tests were reviewed and are normal. She has no history of diabetes, hypothyroidism, obesity or nephrotic syndrome. There are no known factors aggravating her hyperlipidemia. Pertinent negatives include no chest pain, focal sensory loss, focal weakness, leg pain, myalgias or shortness of breath. Current antihyperlipidemic treatment includes statins. The current treatment provides moderate improvement of lipids. There are no compliance problems.  Risk factors for coronary artery disease include family history and post-menopausal.  Insomnia Ambien 5 mg - states this is no longer working for her. Has difficulty initiating sleep and if she does go to sleep, she will wake up after several hours. Has been trying to limit pills to 3x a week but is now waking up due to pain. AMbien works well when she is not hurting   Review of Systems  Constitutional: Negative for fatigue and unexpected weight change.  Respiratory: Negative for shortness of breath.   Cardiovascular: Negative for chest pain and palpitations.  Gastrointestinal: Negative for diarrhea and constipation.  Musculoskeletal: Negative for myalgias.          Neurological: Negative for focal weakness.  Psychiatric/Behavioral: Positive for sleep disturbance.  All other systems reviewed and are negative.      Objective:   Physical Exam  Constitutional: She is oriented to person, place, and time. She appears well-developed and well-nourished.  HENT:  Right Ear: External ear normal.  Left Ear: External ear normal.  Nose: Nose normal.  Mouth/Throat: Oropharynx is clear and moist.  Eyes: EOM are normal.  Neck: Trachea normal, normal range of motion and  full passive range of motion without pain. Neck supple. No JVD present. Carotid bruit is not present. No thyromegaly present.  Cardiovascular: Normal rate, regular rhythm, normal heart sounds and intact distal pulses.  Exam reveals no gallop and no friction rub.   No murmur heard. Pulmonary/Chest: Effort normal and breath sounds normal.  Abdominal: Soft. Bowel sounds are normal. She exhibits no distension and no mass. There is no tenderness.  Musculoskeletal: Normal range of motion.  Right knee FROM without pain- no effusion- no patella tenderness  Lymphadenopathy:    She has no cervical adenopathy.  Neurological: She is alert and oriented to person, place, and time. She has normal reflexes.  Skin: Skin is warm and dry.  Psychiatric: She has a normal mood and affect. Her behavior is normal. Judgment and thought content normal.    BP 130/76  Pulse 95  Temp(Src) 98.3 F (36.8 C) (Oral)  Ht $R'5\' 6"'qH$  (1.676 m)  Wt 180 lb (81.647 kg)  BMI 29.07 kg/m2        Assessment & Plan:   1. Insomnia Bedtime ritual - zolpidem (AMBIEN) 5 MG tablet; TAKE ONE TABLET BY MOUTH AT BEDTIME  Dispense: 30 tablet; Refill: 2  2. Hyperlipidemia with target LDL less than 100 Low fat diet - omega-3 acid ethyl esters (LOVAZA) 1 G capsule; Take 2 capsules (2 g total) by mouth 2 (two) times daily.  Dispense: 120 capsule; Refill: 5 - CMP14+EGFR - NMR, lipoprofile  3. Screening for osteoporosis Weight bearing exerises - DG Bone Density; Future  4. BMI 29.0-29.9,adult Discussed diet and exercise for person with BMI >25 Will recheck weight in  3-6 months   Labs pending Health maintenance reviewed Diet and exercise encouraged Continue all meds Follow up  In 3 months   [place], FNP

## 2013-10-24 NOTE — Patient Instructions (Signed)

## 2013-10-25 ENCOUNTER — Ambulatory Visit (INDEPENDENT_AMBULATORY_CARE_PROVIDER_SITE_OTHER): Payer: Medicare HMO | Admitting: Nurse Practitioner

## 2013-10-25 ENCOUNTER — Encounter: Payer: Self-pay | Admitting: Nurse Practitioner

## 2013-10-25 VITALS — BP 138/82 | HR 114 | Temp 98.3°F | Ht 66.0 in | Wt 180.0 lb

## 2013-10-25 DIAGNOSIS — L500 Allergic urticaria: Secondary | ICD-10-CM

## 2013-10-25 DIAGNOSIS — T50905A Adverse effect of unspecified drugs, medicaments and biological substances, initial encounter: Principal | ICD-10-CM

## 2013-10-25 LAB — NMR, LIPOPROFILE
CHOLESTEROL: 147 mg/dL (ref 100–199)
HDL CHOLESTEROL BY NMR: 38 mg/dL — AB (ref >39)
HDL Particle Number: 26.4 umol/L — ABNORMAL LOW (ref >=30.5)
LDL Particle Number: 1107 nmol/L — ABNORMAL HIGH (ref <1000)
LDL Size: 20.1 nm (ref >20.5)
LDLC SERPL CALC-MCNC: 83 mg/dL (ref 0–99)
LP-IR Score: 44 (ref <=45)
SMALL LDL PARTICLE NUMBER: 695 nmol/L — AB (ref <=527)
Triglycerides by NMR: 129 mg/dL (ref 0–149)

## 2013-10-25 LAB — CMP14+EGFR
A/G RATIO: 1.9 (ref 1.1–2.5)
ALBUMIN: 4.5 g/dL (ref 3.5–4.7)
ALT: 32 IU/L (ref 0–32)
AST: 31 IU/L (ref 0–40)
Alkaline Phosphatase: 53 IU/L (ref 39–117)
BUN/Creatinine Ratio: 15 (ref 11–26)
BUN: 16 mg/dL (ref 8–27)
CO2: 24 mmol/L (ref 18–29)
CREATININE: 1.05 mg/dL — AB (ref 0.57–1.00)
Calcium: 9.5 mg/dL (ref 8.7–10.3)
Chloride: 97 mmol/L (ref 97–108)
GFR calc Af Amer: 57 mL/min/{1.73_m2} — ABNORMAL LOW (ref >59)
GFR, EST NON AFRICAN AMERICAN: 50 mL/min/{1.73_m2} — AB (ref >59)
Globulin, Total: 2.4 g/dL (ref 1.5–4.5)
Glucose: 104 mg/dL — ABNORMAL HIGH (ref 65–99)
Potassium: 4.6 mmol/L (ref 3.5–5.2)
Sodium: 139 mmol/L (ref 134–144)
Total Bilirubin: 0.5 mg/dL (ref 0.0–1.2)
Total Protein: 6.9 g/dL (ref 6.0–8.5)

## 2013-10-25 MED ORDER — METHYLPREDNISOLONE ACETATE 80 MG/ML IJ SUSP
80.0000 mg | Freq: Once | INTRAMUSCULAR | Status: AC
  Administered 2013-10-25: 80 mg via INTRAMUSCULAR

## 2013-10-25 NOTE — Progress Notes (Signed)
   Subjective:    Patient ID: [name], female    DOB: Nov 30, 1929, 80 y.o.   MRN: [id number]  HPI Patient in c/o rash that started last night- slight itching and is spreading. She is on bactrim for UTI.    Review of Systems  Constitutional: Negative.   HENT: Negative.   Respiratory: Negative.   Cardiovascular: Negative.   Genitourinary: Negative.   Neurological: Negative.   Psychiatric/Behavioral: Negative.   All other systems reviewed and are negative.      Objective:   Physical Exam  Constitutional: She appears well-developed and well-nourished.  Cardiovascular: Normal rate, regular rhythm and normal heart sounds.   Pulmonary/Chest: Effort normal and breath sounds normal.  Skin: Skin is warm. Rash noted.  Erythematous maculo-papular rash completely covering trunk and spreading down upper arms and thighs.   BP 138/82  Pulse 114  Temp(Src) 98.3 F (36.8 C) (Oral)  Ht 5\' 6"  (1.676 m)  Wt 180 lb (81.647 kg)  BMI 29.07 kg/m2        Assessment & Plan:   1. Urticaria due to drug allergy    Meds ordered this encounter  Medications  . methylPREDNISolone acetate (DEPO-MEDROL) injection 80 mg    Sig:    Benadryl OTC No more bactrim  Mary-Margaret Hassell Done, FNP

## 2013-10-25 NOTE — Patient Instructions (Signed)
Hives Hives are itchy, red, swollen areas of the skin. They can vary in size and location on your body. Hives can come and go for hours or several days (acute hives) or for several weeks (chronic hives). Hives do not spread from person to person (noncontagious). They may get worse with scratching, exercise, and emotional stress. CAUSES   Allergic reaction to food, additives, or drugs.  Infections, including the common cold.  Illness, such as vasculitis, lupus, or thyroid disease.  Exposure to sunlight, heat, or cold.  Exercise.  Stress.  Contact with chemicals. SYMPTOMS   Red or white swollen patches on the skin. The patches may change size, shape, and location quickly and repeatedly.  Itching.  Swelling of the hands, feet, and face. This may occur if hives develop deeper in the skin. DIAGNOSIS  Your caregiver can usually tell what is wrong by performing a physical exam. Skin or blood tests may also be done to determine the cause of your hives. In some cases, the cause cannot be determined. TREATMENT  Mild cases usually get better with medicines such as antihistamines. Severe cases may require an emergency epinephrine injection. If the cause of your hives is known, treatment includes avoiding that trigger.  HOME CARE INSTRUCTIONS   Avoid causes that trigger your hives.  Take antihistamines as directed by your caregiver to reduce the severity of your hives. Non-sedating or low-sedating antihistamines are usually recommended. Do not drive while taking an antihistamine.  Take any other medicines prescribed for itching as directed by your caregiver.  Wear loose-fitting clothing.  Keep all follow-up appointments as directed by your caregiver. SEEK MEDICAL CARE IF:   You have persistent or severe itching that is not relieved with medicine.  You have painful or swollen joints. SEEK IMMEDIATE MEDICAL CARE IF:   You have a fever.  Your tongue or lips are swollen.  You have  trouble breathing or swallowing.  You feel tightness in the throat or chest.  You have abdominal pain. These problems may be the first sign of a life-threatening allergic reaction. Call your local emergency services (911 in [place] MAKE SURE YOU:   Understand these instructions.  Will watch your condition.  Will get help right away if you are not doing well or get worse. Document Released: 01/17/2005 Document Revised: 01/22/2013 Document Reviewed: 04/12/2011 ExitCare Patient Information 2015 [employer]. This information is not intended to replace advice given to you by your health care provider. Make sure you discuss any questions you have with your health care provider.  

## 2013-10-28 ENCOUNTER — Ambulatory Visit: Payer: Medicare HMO

## 2013-11-04 ENCOUNTER — Ambulatory Visit (INDEPENDENT_AMBULATORY_CARE_PROVIDER_SITE_OTHER): Payer: Medicare HMO

## 2013-11-04 DIAGNOSIS — Z23 Encounter for immunization: Secondary | ICD-10-CM

## 2013-11-14 ENCOUNTER — Other Ambulatory Visit: Payer: Self-pay | Admitting: Nurse Practitioner

## 2013-11-21 ENCOUNTER — Telehealth: Payer: Self-pay | Admitting: Nurse Practitioner

## 2013-11-21 DIAGNOSIS — E785 Hyperlipidemia, unspecified: Secondary | ICD-10-CM

## 2013-11-21 MED ORDER — OMEGA-3-ACID ETHYL ESTERS 1 G PO CAPS: 2.0000 g | ORAL_CAPSULE | Freq: Two times a day (BID) | ORAL | Status: DC

## 2013-11-21 NOTE — Telephone Encounter (Signed)
Done and pt aware

## 2014-01-10 ENCOUNTER — Telehealth: Payer: Self-pay | Admitting: Nurse Practitioner

## 2014-01-10 DIAGNOSIS — G4700 Insomnia, unspecified: Secondary | ICD-10-CM

## 2014-01-10 MED ORDER — ZOLPIDEM TARTRATE 5 MG PO TABS: ORAL_TABLET | ORAL | Status: DC

## 2014-01-10 NOTE — Telephone Encounter (Signed)
Called in,pt aware 

## 2014-01-10 NOTE — Telephone Encounter (Signed)
Please call in ambien 5mg  1 po qhs #30 with 2 refills

## 2014-01-15 ENCOUNTER — Encounter: Payer: Self-pay | Admitting: Pharmacist

## 2014-01-15 ENCOUNTER — Ambulatory Visit (INDEPENDENT_AMBULATORY_CARE_PROVIDER_SITE_OTHER): Payer: Medicare HMO

## 2014-01-15 ENCOUNTER — Ambulatory Visit (INDEPENDENT_AMBULATORY_CARE_PROVIDER_SITE_OTHER): Payer: Medicare HMO | Admitting: Pharmacist

## 2014-01-15 VITALS — Ht 66.0 in | Wt 177.0 lb

## 2014-01-15 DIAGNOSIS — M8580 Other specified disorders of bone density and structure, unspecified site: Secondary | ICD-10-CM | POA: Diagnosis not present

## 2014-01-15 DIAGNOSIS — Z13820 Encounter for screening for osteoporosis: Secondary | ICD-10-CM

## 2014-01-15 LAB — HM DEXA SCAN

## 2014-01-15 NOTE — Patient Instructions (Signed)
Fall Prevention and Home Safety Falls cause injuries and can affect all age groups. It is possible to use preventive measures to significantly decrease the likelihood of falls. There are many simple measures which can make your home safer and prevent falls. OUTDOORS  Repair cracks and edges of walkways and driveways.  Remove high doorway thresholds.  Trim shrubbery on the main path into your home.  Have good outside lighting.  Clear walkways of tools, rocks, debris, and clutter.  Check that handrails are not broken and are securely fastened. Both sides of steps should have handrails.  Have leaves, snow, and ice cleared regularly.  Use sand or salt on walkways during winter months.  In the garage, clean up grease or oil spills. BATHROOM  Install night lights.  Install grab bars by the toilet and in the tub and shower.  Use non-skid mats or decals in the tub or shower.  Place a plastic non-slip stool in the shower to sit on, if needed.  Keep floors dry and clean up all water on the floor immediately.  Remove soap buildup in the tub or shower on a regular basis.  Secure bath mats with non-slip, double-sided rug tape.  Remove throw rugs and tripping hazards from the floors. BEDROOMS  Install night lights.  Make sure a bedside light is easy to reach.  Do not use oversized bedding.  Keep a telephone by your bedside.  Have a firm chair with side arms to use for getting dressed.  Remove throw rugs and tripping hazards from the floor. KITCHEN  Keep handles on pots and pans turned toward the center of the stove. Use back burners when possible.  Clean up spills quickly and allow time for drying.  Avoid walking on wet floors.  Avoid hot utensils and knives.  Position shelves so they are not too high or low.  Place commonly used objects within easy reach.  If necessary, use a sturdy step stool with a grab bar when reaching.  Keep electrical cables out of the  way.  Do not use floor polish or wax that makes floors slippery. If you must use wax, use non-skid floor wax.  Remove throw rugs and tripping hazards from the floor. STAIRWAYS  Never leave objects on stairs.  Place handrails on both sides of stairways and use them. Fix any loose handrails. Make sure handrails on both sides of the stairways are as long as the stairs.  Check carpeting to make sure it is firmly attached along stairs. Make repairs to worn or loose carpet promptly.  Avoid placing throw rugs at the top or bottom of stairways, or properly secure the rug with carpet tape to prevent slippage. Get rid of throw rugs, if possible.  Have an electrician put in a light switch at the top and bottom of the stairs. OTHER FALL PREVENTION TIPS  Wear low-heel or rubber-soled shoes that are supportive and fit well. Wear closed toe shoes.  When using a stepladder, make sure it is fully opened and both spreaders are firmly locked. Do not climb a closed stepladder.  Add color or contrast paint or tape to grab bars and handrails in your home. Place contrasting color strips on first and last steps.  Learn and use mobility aids as needed. Install an electrical emergency response system.  Turn on lights to avoid dark areas. Replace light bulbs that burn out immediately. Get light switches that glow.  Arrange furniture to create clear pathways. Keep furniture in the same place.    Firmly attach carpet with non-skid or double-sided tape.  Eliminate uneven floor surfaces.  Select a carpet pattern that does not visually hide the edge of steps.  Be aware of all pets. OTHER HOME SAFETY TIPS  Set the water temperature for 120 F (48.8 C).  Keep emergency numbers on or near the telephone.  Keep smoke detectors on every level of the home and near sleeping areas. Document Released: 01/07/2002 Document Revised: 07/19/2011 Document Reviewed: 04/08/2011 ExitCare Patient Information 2015  [employer]. This information is not intended to replace advice given to you by your health care provider. Make sure you discuss any questions you have with your health care provider.                Exercise for Strong Bones  Exercise is important to build and maintain strong bones / bone density.  There are 2 types of exercises that are important to building and maintaining strong bones:  Weight- bearing and muscle-stregthening.  Weight-bearing Exercises  These exercises include activities that make you move against gravity while staying upright. Weight-bearing exercises can be high-impact or low-impact.  High-impact weight-bearing exercises help build bones and keep them strong. If you have broken a bone due to osteoporosis or are at risk of breaking a bone, you may need to avoid high-impact exercises. If you're not sure, you should check with your healthcare provider.  Examples of high-impact weight-bearing exercises are: Dancing  Doing high-impact aerobics  Hiking  Jogging/running  Jumping Rope  Stair climbing  Tennis  Low-impact weight-bearing exercises can also help keep bones strong and are a safe alternative if you cannot do high-impact exercises.   Examples of low-impact weight-bearing exercises are: Using elliptical training machines  Doing low-impact aerobics  Using stair-step machines  Fast walking on a treadmill or outside   Muscle-Strengthening Exercises These exercises include activities where you move your body, a weight or some other resistance against gravity. They are also known as resistance exercises and include: Lifting weights  Using elastic exercise bands  Using weight machines  Lifting your own body weight  Functional movements, such as standing and rising up on your toes  Yoga and Pilates can also improve strength, balance and flexibility. However, certain positions may not be safe for people with osteoporosis or those at increased risk of broken  bones. For example, exercises that have you bend forward may increase the chance of breaking a bone in the spine.   Non-Impact Exercises There are other types of exercises that can help prevent falls.  Non-impact exercises can help you to improve balance, posture and how well you move in everyday activities. Some of these exercises include: Balance exercises that strengthen your legs and test your balance, such as Tai Chi, can decrease your risk of falls.  Posture exercises that improve your posture and reduce rounded or "sloping" shoulders can help you decrease the chance of breaking a bone, especially in the spine.  Functional exercises that improve how well you move can help you with everyday activities and decrease your chance of falling and breaking a bone. For example, if you have trouble getting up from a chair or climbing stairs, you should do these activities as exercises.   **A physical therapist can teach you balance, posture and functional exercises. He/she can also help you learn which exercises are safe and appropriate for you.  [facility] has a physical therapy office in [place] in front of our office and referrals can be made for assessments   and treatment as needed and strength and balance training.  If you would like to have an assessment with [facility] and our physical therapy team please let a nurse or provider know.    

## 2014-01-15 NOTE — Progress Notes (Signed)
Patient ID: Wendy Robles, female   DOB: August 30, 1929, 80 y.o.   MRN: [id number]  [facility] Current Height: Height: 5\' 6"  (167.6 cm)      Max Lifetime Height:  5\' 8"  Current Weight: Weight: 177 lb (80.287 kg)       Ethnicity:Caucasian    HPI: Does pt already have a diagnosis of:  Osteopenia?  Yes Osteoporosis?  No  Back Pain?  No       Kyphosis?  No Prior fracture?  No Med(s) for Osteoporosis/Osteopenia:  none Med(s) previously tried for Osteoporosis/Osteopenia:  none                                                             PMH: Age at menopause:  19's Hysterectomy?  Yes Oophorectomy?  Yes HRT? No Steroid Use?  No Thyroid med?  No History of cancer?  No History of digestive disorders (ie Crohn's)?  No Current or previous eating disorders?  No Last Vitamin D Result:  Has been over 2 years - patient to get checked with next labs Last GFR Result:  50 (10/24/2013)   FH/SH: Family history of osteoporosis?  No Parent with history of hip fracture?  Yes - mother fell in nursing home / broken hip Family history of breast cancer?  No Exercise?  No Smoking?  No Alcohol?  No    Calcium Assessment Calcium Intake  # of servings/day  Calcium mg  Milk (8 oz) 1  x  300  = 300mg   Yogurt (4 oz) 0 x  200 = 0  Cheese (1 oz) 1 x  200 = 200mg   Other Calcium sources   250mg   Ca supplement Calcium bid = 1200mg    Estimated calcium intake per day 1950mg     DEXA Results Date of Test T-Score for AP Spine L1-L4 T-Score for Total Left Hip T-Score for Total Right Hip  01/15/2014 -0.1 0.1 0.0  10/12/2011 -0.3 0.2 -0.2  07/25/2007 -0.8 0.0 -0.2  02/14/2005 -0.8 0.0 -0.1  11/18/2002 -1.8 0.0 --    Assessment: Osteopenia with continued improved BMD  Recommendations: 1.  Discussed BMD  / DEXA results and discussed fracture risk. 2.  recommend calcium 1200mg  daily through supplementation or diet. Change calcium to 1 tablet daily - should help decrease constipation. 3.   recommend weight bearing exercise - 30 minutes at least 4 days per week.   4.  Counseled and educated about fall risk and prevention.  Recheck DEXA:  2 years  Time spent counseling patient:  20 minutes  Cherre Robins, PharmD, CPP

## 2014-01-27 ENCOUNTER — Ambulatory Visit: Payer: Medicare HMO | Admitting: Nurse Practitioner

## 2014-02-03 ENCOUNTER — Encounter: Payer: Self-pay | Admitting: Nurse Practitioner

## 2014-02-03 ENCOUNTER — Ambulatory Visit (INDEPENDENT_AMBULATORY_CARE_PROVIDER_SITE_OTHER): Payer: Medicare HMO | Admitting: Nurse Practitioner

## 2014-02-03 VITALS — BP 149/89 | HR 72 | Temp 96.5°F | Ht 66.0 in | Wt 177.0 lb

## 2014-02-03 DIAGNOSIS — G4700 Insomnia, unspecified: Secondary | ICD-10-CM

## 2014-02-03 DIAGNOSIS — E785 Hyperlipidemia, unspecified: Secondary | ICD-10-CM

## 2014-02-03 DIAGNOSIS — G609 Hereditary and idiopathic neuropathy, unspecified: Secondary | ICD-10-CM | POA: Insufficient documentation

## 2014-02-03 MED ORDER — DULOXETINE HCL 30 MG PO CPEP: 30.0000 mg | ORAL_CAPSULE | Freq: Every day | ORAL | Status: DC

## 2014-02-03 MED ORDER — ZOLPIDEM TARTRATE 5 MG PO TABS: ORAL_TABLET | ORAL | Status: DC

## 2014-02-03 MED ORDER — ATORVASTATIN CALCIUM 40 MG PO TABS: 40.0000 mg | ORAL_TABLET | Freq: Every day | ORAL | Status: DC

## 2014-02-03 NOTE — Patient Instructions (Signed)

## 2014-02-03 NOTE — Progress Notes (Signed)
  Subjective:    Patient ID: Wendy Robles, female    DOB: July 02, 1929, 81 y.o.   MRN: [id number]  Patient here today for follow up of chronic medical problems.  Hyperlipidemia This is a new problem. The current episode started more than 1 year ago. The problem is controlled. Recent lipid tests were reviewed and are normal. She has no history of diabetes, hypothyroidism or obesity. Pertinent negatives include no chest pain, myalgias or shortness of breath. Current antihyperlipidemic treatment includes statins. The current treatment provides no improvement of lipids. Compliance problems include adherence to diet and adherence to exercise.  Risk factors for coronary artery disease include dyslipidemia and family history.  Insomnia Ambien 5 mg - states this is no longer working for her. Has difficulty initiating sleep and if she does go to sleep, she will wake up after several hours. Has been trying to limit pills to 3x a week but is now waking up due to pain. AMbien works well when she is not hurting Neuropathy bil lower ext Feet and legs just burn and hurt at night. She has tried neurotin but could not tolerate it. But pain is getting unbearable.      Review of Systems  Constitutional: Negative for fatigue and unexpected weight change.  Respiratory: Negative for shortness of breath.   Cardiovascular: Negative for chest pain and palpitations.  Gastrointestinal: Negative for diarrhea and constipation.  Musculoskeletal: Negative for myalgias.          Psychiatric/Behavioral: Positive for sleep disturbance.  All other systems reviewed and are negative.      Objective:   Physical Exam  Constitutional: She is oriented to person, place, and time. She appears well-developed and well-nourished.  HENT:  Nose: Nose normal.  Mouth/Throat: Oropharynx is clear and moist.  Eyes: EOM are normal.  Neck: Trachea normal, normal range of motion and full passive range of motion without pain. Neck  supple. No JVD present. Carotid bruit is not present. No thyromegaly present.  Cardiovascular: Normal rate, regular rhythm, normal heart sounds and intact distal pulses.  Exam reveals no gallop and no friction rub.   No murmur heard. Pulmonary/Chest: Effort normal and breath sounds normal.  Abdominal: Soft. Bowel sounds are normal. She exhibits no distension and no mass. There is no tenderness.  Musculoskeletal: Normal range of motion.  Lymphadenopathy:    She has no cervical adenopathy.  Neurological: She is alert and oriented to person, place, and time. She has normal reflexes.  Skin: Skin is warm and dry.  Psychiatric: She has a normal mood and affect. Her behavior is normal. Judgment and thought content normal.    BP 149/89 mmHg  Pulse 72  Temp(Src) 96.5 F (35.8 C) (Oral)  Ht 5\' 6"  (1.676 m)  Wt 177 lb (80.287 kg)  BMI 28.58 kg/m2        Assessment & Plan:

## 2014-02-04 LAB — CMP14+EGFR
ALK PHOS: 59 IU/L (ref 39–117)
ALT: 30 IU/L (ref 0–32)
AST: 28 IU/L (ref 0–40)
Albumin/Globulin Ratio: 2 (ref 1.1–2.5)
Albumin: 4.3 g/dL (ref 3.5–4.7)
BILIRUBIN TOTAL: 0.4 mg/dL (ref 0.0–1.2)
BUN / CREAT RATIO: 20 (ref 11–26)
BUN: 14 mg/dL (ref 8–27)
CO2: 30 mmol/L — ABNORMAL HIGH (ref 18–29)
Calcium: 9.8 mg/dL (ref 8.7–10.3)
Chloride: 102 mmol/L (ref 97–108)
Creatinine, Ser: 0.69 mg/dL (ref 0.57–1.00)
GFR calc non Af Amer: 81 mL/min/{1.73_m2} (ref >59)
GFR, EST AFRICAN AMERICAN: 94 mL/min/{1.73_m2} (ref >59)
GLOBULIN, TOTAL: 2.1 g/dL (ref 1.5–4.5)
Glucose: 97 mg/dL (ref 65–99)
Potassium: 4.3 mmol/L (ref 3.5–5.2)
SODIUM: 145 mmol/L — AB (ref 134–144)
Total Protein: 6.4 g/dL (ref 6.0–8.5)

## 2014-02-04 LAB — NMR, LIPOPROFILE
CHOLESTEROL: 159 mg/dL (ref 100–199)
HDL Cholesterol by NMR: 54 mg/dL (ref >39)
HDL Particle Number: 35.6 umol/L (ref >=30.5)
LDL PARTICLE NUMBER: 944 nmol/L (ref <1000)
LDL SIZE: 20.5 nm (ref >20.5)
LDL-C: 82 mg/dL (ref 0–99)
LP-IR SCORE: 37 (ref <=45)
Small LDL Particle Number: 448 nmol/L (ref <=527)
Triglycerides by NMR: 116 mg/dL (ref 0–149)

## 2014-02-17 ENCOUNTER — Telehealth: Payer: Self-pay | Admitting: Nurse Practitioner

## 2014-02-17 NOTE — Telephone Encounter (Signed)
Pt CB and she was given appt for MMM 1/22 at 8: to discuss anxiety.

## 2014-02-17 NOTE — Telephone Encounter (Signed)
Really need to see her to discussp

## 2014-02-21 ENCOUNTER — Ambulatory Visit: Payer: Medicare HMO | Admitting: Nurse Practitioner

## 2014-05-05 ENCOUNTER — Encounter: Payer: Self-pay | Admitting: Nurse Practitioner

## 2014-05-05 ENCOUNTER — Ambulatory Visit (INDEPENDENT_AMBULATORY_CARE_PROVIDER_SITE_OTHER): Payer: Medicare HMO | Admitting: Nurse Practitioner

## 2014-05-05 VITALS — BP 136/78 | HR 97 | Temp 97.4°F | Ht 66.0 in | Wt 177.0 lb

## 2014-05-05 DIAGNOSIS — G4700 Insomnia, unspecified: Secondary | ICD-10-CM

## 2014-05-05 DIAGNOSIS — M8580 Other specified disorders of bone density and structure, unspecified site: Secondary | ICD-10-CM | POA: Diagnosis not present

## 2014-05-05 DIAGNOSIS — G609 Hereditary and idiopathic neuropathy, unspecified: Secondary | ICD-10-CM

## 2014-05-05 DIAGNOSIS — E785 Hyperlipidemia, unspecified: Secondary | ICD-10-CM

## 2014-05-05 MED ORDER — OMEGA-3-ACID ETHYL ESTERS 1 G PO CAPS: 2.0000 g | ORAL_CAPSULE | Freq: Two times a day (BID) | ORAL | Status: DC

## 2014-05-05 MED ORDER — ATORVASTATIN CALCIUM 40 MG PO TABS: 40.0000 mg | ORAL_TABLET | Freq: Every day | ORAL | Status: DC

## 2014-05-05 MED ORDER — ZOLPIDEM TARTRATE 5 MG PO TABS: ORAL_TABLET | ORAL | Status: DC

## 2014-05-05 NOTE — Patient Instructions (Signed)

## 2014-05-05 NOTE — Progress Notes (Signed)
Subjective:    Patient ID: Wendy Robles, female    DOB: [birth year], 81 y.o.   MRN: [id number]  Patient here today for follow up of chronic medical problems. No acute complaints.   Hyperlipidemia This is a new problem. The current episode started more than 1 year ago. The problem is controlled. Recent lipid tests were reviewed and are normal. She has no history of diabetes, hypothyroidism or obesity. Pertinent negatives include no chest pain, myalgias or shortness of breath. Current antihyperlipidemic treatment includes statins. The current treatment provides no improvement of lipids. Compliance problems include adherence to diet and adherence to exercise.  Risk factors for coronary artery disease include dyslipidemia and family history.  Insomnia Ambien 5 mg - states this is no longer working for her. Has difficulty initiating sleep and if she does go to sleep, she will wake up after several hours. Has been trying to limit pills to 3x a week but is now waking up due to pain. AMbien works well when she is not hurting Neuropathy bil lower ext Feet and legs just burn and hurt at night. She has tried neurotin but could not tolerate it. But pain is getting unbearable.    Review of Systems  Constitutional: Negative.  Negative for fatigue and unexpected weight change.  HENT: Negative.   Eyes: Negative.   Respiratory: Negative.  Negative for shortness of breath.   Cardiovascular: Negative.  Negative for chest pain and palpitations.  Gastrointestinal: Negative.  Negative for diarrhea and constipation.  Endocrine: Negative.   Genitourinary: Negative.   Musculoskeletal: Negative for myalgias.          Skin: Negative.   Allergic/Immunologic: Negative.   Neurological: Negative.   Hematological: Negative.   Psychiatric/Behavioral: Positive for sleep disturbance.  All other systems reviewed and are negative.      Objective:   Physical Exam  Constitutional: She is oriented to person, place,  and time. She appears well-developed and well-nourished.  HENT:  Nose: Nose normal.  Mouth/Throat: Oropharynx is clear and moist.  Eyes: EOM are normal.  Neck: Trachea normal, normal range of motion and full passive range of motion without pain. Neck supple. No JVD present. Carotid bruit is not present. No thyromegaly present.  Cardiovascular: Normal rate, regular rhythm, normal heart sounds and intact distal pulses.  Exam reveals no gallop and no friction rub.   No murmur heard. Pulmonary/Chest: Effort normal and breath sounds normal.  Abdominal: Soft. Bowel sounds are normal. She exhibits no distension and no mass. There is no tenderness.  Musculoskeletal: Normal range of motion.  Ambulate with a walker.   Lymphadenopathy:    She has no cervical adenopathy.  Neurological: She is alert and oriented to person, place, and time. She has normal reflexes.  Skin: Skin is warm and dry.  Psychiatric: She has a normal mood and affect. Her behavior is normal. Judgment and thought content normal.    BP 136/78 mmHg  Pulse 97  Temp(Src) 97.4 F (36.3 C) (Oral)  Ht $R'5\' 6"'zt$  (1.676 m)  Wt 177 lb (80.287 kg)  BMI 28.58 kg/m2         Assessment & Plan:   1. Hyperlipidemia with target LDL less than 100 Low fat diet - CMP14+EGFR - NMR, lipoprofile - atorvastatin (LIPITOR) 40 MG tablet; Take 1 tablet (40 mg total) by mouth daily.  Dispense: 30 tablet; Refill: 5 - omega-3 acid ethyl esters (LOVAZA) 1 G capsule; Take 2 capsules (2 g total) by mouth 2 (two) times daily.  Dispense: 120 capsule; Refill: 5  2. Insomnia Night time routine - zolpidem (AMBIEN) 5 MG tablet; TAKE ONE TABLET BY MOUTH AT BEDTIME  Dispense: 30 tablet; Refill: 2  3. Osteopenia   4. Hereditary and idiopathic peripheral neuropathy    Tetanus will get at next visit Labs pending Health maintenance reviewed Diet and exercise encouraged Continue all meds Follow up  In 3 months   [place], FNP

## 2014-05-06 LAB — CMP14+EGFR
A/G RATIO: 2.4 (ref 1.1–2.5)
ALT: 24 IU/L (ref 0–32)
AST: 25 IU/L (ref 0–40)
Albumin: 4.6 g/dL (ref 3.5–4.7)
Alkaline Phosphatase: 55 IU/L (ref 39–117)
BILIRUBIN TOTAL: 0.5 mg/dL (ref 0.0–1.2)
BUN/Creatinine Ratio: 22 (ref 11–26)
BUN: 15 mg/dL (ref 8–27)
CHLORIDE: 104 mmol/L (ref 97–108)
CO2: 29 mmol/L (ref 18–29)
Calcium: 10 mg/dL (ref 8.7–10.3)
Creatinine, Ser: 0.69 mg/dL (ref 0.57–1.00)
GFR calc Af Amer: 94 mL/min/{1.73_m2} (ref >59)
GFR, EST NON AFRICAN AMERICAN: 81 mL/min/{1.73_m2} (ref >59)
Globulin, Total: 1.9 g/dL (ref 1.5–4.5)
Glucose: 97 mg/dL (ref 65–99)
POTASSIUM: 4.3 mmol/L (ref 3.5–5.2)
Sodium: 145 mmol/L — ABNORMAL HIGH (ref 134–144)
Total Protein: 6.5 g/dL (ref 6.0–8.5)

## 2014-05-06 LAB — NMR, LIPOPROFILE
CHOLESTEROL: 151 mg/dL (ref 100–199)
HDL CHOLESTEROL BY NMR: 58 mg/dL (ref >39)
HDL Particle Number: 36.2 umol/L (ref >=30.5)
LDL Particle Number: 1092 nmol/L — ABNORMAL HIGH (ref <1000)
LDL Size: 20.3 nm (ref >20.5)
LDL-C: 74 mg/dL (ref 0–99)
LP-IR Score: 27 (ref <=45)
SMALL LDL PARTICLE NUMBER: 635 nmol/L — AB (ref <=527)
Triglycerides by NMR: 93 mg/dL (ref 0–149)

## 2014-08-14 ENCOUNTER — Encounter: Payer: Self-pay | Admitting: Nurse Practitioner

## 2014-08-14 ENCOUNTER — Ambulatory Visit (INDEPENDENT_AMBULATORY_CARE_PROVIDER_SITE_OTHER): Payer: Medicare HMO | Admitting: Nurse Practitioner

## 2014-08-14 VITALS — BP 141/85 | HR 93 | Temp 97.4°F | Ht 66.0 in | Wt 180.0 lb

## 2014-08-14 DIAGNOSIS — E785 Hyperlipidemia, unspecified: Secondary | ICD-10-CM

## 2014-08-14 DIAGNOSIS — G4700 Insomnia, unspecified: Secondary | ICD-10-CM | POA: Diagnosis not present

## 2014-08-14 DIAGNOSIS — M8580 Other specified disorders of bone density and structure, unspecified site: Secondary | ICD-10-CM | POA: Diagnosis not present

## 2014-08-14 DIAGNOSIS — G609 Hereditary and idiopathic neuropathy, unspecified: Secondary | ICD-10-CM

## 2014-08-14 DIAGNOSIS — Z23 Encounter for immunization: Secondary | ICD-10-CM

## 2014-08-14 DIAGNOSIS — Z6829 Body mass index (BMI) 29.0-29.9, adult: Secondary | ICD-10-CM | POA: Diagnosis not present

## 2014-08-14 MED ORDER — OMEGA-3-ACID ETHYL ESTERS 1 G PO CAPS: 2.0000 g | ORAL_CAPSULE | Freq: Two times a day (BID) | ORAL | Status: DC

## 2014-08-14 MED ORDER — ATORVASTATIN CALCIUM 40 MG PO TABS: 40.0000 mg | ORAL_TABLET | Freq: Every day | ORAL | Status: DC

## 2014-08-14 MED ORDER — ZOLPIDEM TARTRATE 5 MG PO TABS: ORAL_TABLET | ORAL | Status: DC

## 2014-08-14 NOTE — Patient Instructions (Signed)

## 2014-08-14 NOTE — Progress Notes (Signed)
Subjective:    Patient ID: Wendy Robles, female    DOB: 03/09/1929, 81 y.o.   MRN: [id number]  Patient here today for follow up of chronic medical problems. No acute complaints.   Hyperlipidemia This is a new problem. The current episode started more than 1 year ago. The problem is controlled. Recent lipid tests were reviewed and are normal. She has no history of diabetes, hypothyroidism or obesity. Pertinent negatives include no chest pain, myalgias or shortness of breath. Current antihyperlipidemic treatment includes statins. The current treatment provides no improvement of lipids. Compliance problems include adherence to diet and adherence to exercise.  Risk factors for coronary artery disease include dyslipidemia and family history.  Insomnia Ambien 5 mg - states this is no longer working for her. Has difficulty initiating sleep and if she does go to sleep, she will wake up after several hours. Has been trying to limit pills to 3x a week but is now waking up due to pain. AMbien works well when she is not hurting Neuropathy bil lower ext Feet and legs just burn and hurt at night. She has tried neurotin but could not tolerate it. But pain is getting unbearable. Osteopenia Takes vitamin d daily- no weight bearing exercise because she is unsteady on her feet and walks with a walker.   Review of Systems  Constitutional: Negative.  Negative for fatigue and unexpected weight change.  HENT: Negative.   Eyes: Negative.   Respiratory: Negative.  Negative for shortness of breath.   Cardiovascular: Negative.  Negative for chest pain and palpitations.  Gastrointestinal: Negative.  Negative for diarrhea and constipation.  Endocrine: Negative.   Genitourinary: Negative.   Musculoskeletal: Negative for myalgias.          Skin: Negative.   Allergic/Immunologic: Negative.   Neurological: Negative.   Hematological: Negative.   Psychiatric/Behavioral: Positive for sleep disturbance.  All other  systems reviewed and are negative.      Objective:   Physical Exam  Constitutional: She is oriented to person, place, and time. She appears well-developed and well-nourished.  HENT:  Nose: Nose normal.  Mouth/Throat: Oropharynx is clear and moist.  Eyes: EOM are normal.  Neck: Trachea normal, normal range of motion and full passive range of motion without pain. Neck supple. No JVD present. Carotid bruit is not present. No thyromegaly present.  Cardiovascular: Normal rate, regular rhythm, normal heart sounds and intact distal pulses.  Exam reveals no gallop and no friction rub.   No murmur heard. Pulmonary/Chest: Effort normal and breath sounds normal.  Abdominal: Soft. Bowel sounds are normal. She exhibits no distension and no mass. There is no tenderness.  Musculoskeletal: Normal range of motion.  Ambulate with a walker.   Lymphadenopathy:    She has no cervical adenopathy.  Neurological: She is alert and oriented to person, place, and time. She has normal reflexes.  Skin: Skin is warm and dry.  Psychiatric: She has a normal mood and affect. Her behavior is normal. Judgment and thought content normal.    BP 141/85 mmHg  Pulse 93  Temp(Src) 97.4 F (36.3 C) (Oral)  Ht 5' 6" (1.676 m)  Wt 180 lb (81.647 kg)  BMI 29.07 kg/m2         Assessment & Plan:   1. Hereditary and idiopathic peripheral neuropathy no change  2. Osteopenia Try to walk as much as possible  3. Insomnia Bedtime ritual - zolpidem (AMBIEN) 5 MG tablet; TAKE ONE TABLET BY MOUTH AT BEDTIME  Dispense:   30 tablet; Refill: 2  4. Hyperlipidemia with target LDL less than 100 Low fat diet - atorvastatin (LIPITOR) 40 MG tablet; Take 1 tablet (40 mg total) by mouth daily.  Dispense: 30 tablet; Refill: 5 - omega-3 acid ethyl esters (LOVAZA) 1 G capsule; Take 2 capsules (2 g total) by mouth 2 (two) times daily.  Dispense: 120 capsule; Refill: 5 - CMP14+EGFR - Lipid panel  5. BMI 29.0-29.9,adult Discussed  diet and exercise for person with BMI >25 Will recheck weight in 3-6 months    Refuses chest xray and EKG today Labs pending Health maintenance reviewed Diet and exercise encouraged Continue all meds Follow up  In 3 month   Mary-Margaret Martin, FNP    

## 2014-08-15 LAB — LIPID PANEL
Chol/HDL Ratio: 2.9 ratio units (ref 0.0–4.4)
Cholesterol, Total: 156 mg/dL (ref 100–199)
HDL: 53 mg/dL (ref >39)
LDL Calculated: 70 mg/dL (ref 0–99)
Triglycerides: 164 mg/dL — ABNORMAL HIGH (ref 0–149)
VLDL CHOLESTEROL CAL: 33 mg/dL (ref 5–40)

## 2014-08-15 LAB — CMP14+EGFR
ALT: 26 IU/L (ref 0–32)
AST: 27 IU/L (ref 0–40)
Albumin/Globulin Ratio: 1.8 (ref 1.1–2.5)
Albumin: 4.3 g/dL (ref 3.5–4.7)
Alkaline Phosphatase: 57 IU/L (ref 39–117)
BUN/Creatinine Ratio: 23 (ref 11–26)
BUN: 16 mg/dL (ref 8–27)
Bilirubin Total: 0.6 mg/dL (ref 0.0–1.2)
CO2: 27 mmol/L (ref 18–29)
Calcium: 10 mg/dL (ref 8.7–10.3)
Chloride: 102 mmol/L (ref 97–108)
Creatinine, Ser: 0.71 mg/dL (ref 0.57–1.00)
GFR calc Af Amer: 91 mL/min/{1.73_m2} (ref >59)
GFR calc non Af Amer: 79 mL/min/{1.73_m2} (ref >59)
Globulin, Total: 2.4 g/dL (ref 1.5–4.5)
Glucose: 105 mg/dL — ABNORMAL HIGH (ref 65–99)
Potassium: 4.3 mmol/L (ref 3.5–5.2)
Sodium: 145 mmol/L — ABNORMAL HIGH (ref 134–144)
Total Protein: 6.7 g/dL (ref 6.0–8.5)

## 2014-11-20 ENCOUNTER — Ambulatory Visit: Payer: Medicare HMO | Admitting: Nurse Practitioner

## 2014-12-04 ENCOUNTER — Ambulatory Visit (INDEPENDENT_AMBULATORY_CARE_PROVIDER_SITE_OTHER): Payer: Medicare HMO | Admitting: Nurse Practitioner

## 2014-12-04 ENCOUNTER — Encounter: Payer: Self-pay | Admitting: Nurse Practitioner

## 2014-12-04 VITALS — BP 134/82 | HR 93 | Temp 98.1°F | Ht 66.0 in | Wt 185.0 lb

## 2014-12-04 DIAGNOSIS — G4700 Insomnia, unspecified: Secondary | ICD-10-CM

## 2014-12-04 DIAGNOSIS — Z6829 Body mass index (BMI) 29.0-29.9, adult: Secondary | ICD-10-CM

## 2014-12-04 DIAGNOSIS — E785 Hyperlipidemia, unspecified: Secondary | ICD-10-CM | POA: Diagnosis not present

## 2014-12-04 DIAGNOSIS — Z23 Encounter for immunization: Secondary | ICD-10-CM

## 2014-12-04 DIAGNOSIS — G609 Hereditary and idiopathic neuropathy, unspecified: Secondary | ICD-10-CM | POA: Diagnosis not present

## 2014-12-04 MED ORDER — ZOLPIDEM TARTRATE 5 MG PO TABS: ORAL_TABLET | ORAL | Status: DC

## 2014-12-04 NOTE — Patient Instructions (Signed)
Health Maintenance, Female Adopting a healthy lifestyle and getting preventive care can go a long way to promote health and wellness. Talk with your health care provider about what schedule of regular examinations is right for you. This is a good chance for you to check in with your provider about disease prevention and staying healthy. In between checkups, there are plenty of things you can do on your own. Experts have done a lot of research about which lifestyle changes and preventive measures are most likely to keep you healthy. Ask your health care provider for more information. WEIGHT AND DIET  Eat a healthy diet  Be sure to include plenty of vegetables, fruits, low-fat dairy products, and lean protein.  Do not eat a lot of foods high in solid fats, added sugars, or salt.  Get regular exercise. This is one of the most important things you can do for your health.  Most adults should exercise for at least 150 minutes each week. The exercise should increase your heart rate and make you sweat (moderate-intensity exercise).  Most adults should also do strengthening exercises at least twice a week. This is in addition to the moderate-intensity exercise.  Maintain a healthy weight  Body mass index (BMI) is a measurement that can be used to identify possible weight problems. It estimates body fat based on height and weight. Your health care provider can help determine your BMI and help you achieve or maintain a healthy weight.  For females 20 years of age and older:   A BMI below 18.5 is considered underweight.  A BMI of 18.5 to 24.9 is normal.  A BMI of 25 to 29.9 is considered overweight.  A BMI of 30 and above is considered obese.  Watch levels of cholesterol and blood lipids  You should start having your blood tested for lipids and cholesterol at 79 years of age, then have this test every 5 years.  You may need to have your cholesterol levels checked more often if:  Your lipid  or cholesterol levels are high.  You are older than 79 years of age.  You are at high risk for heart disease.  CANCER SCREENING   Lung Cancer  Lung cancer screening is recommended for adults 55-80 years old who are at high risk for lung cancer because of a history of smoking.  A yearly low-dose CT scan of the lungs is recommended for people who:  Currently smoke.  Have quit within the past 15 years.  Have at least a 30-pack-year history of smoking. A pack year is smoking an average of one pack of cigarettes a day for 1 year.  Yearly screening should continue until it has been 15 years since you quit.  Yearly screening should stop if you develop a health problem that would prevent you from having lung cancer treatment.  Breast Cancer  Practice breast self-awareness. This means understanding how your breasts normally appear and feel.  It also means doing regular breast self-exams. Let your health care provider know about any changes, no matter how small.  If you are in your 20s or 30s, you should have a clinical breast exam (CBE) by a health care provider every 1-3 years as part of a regular health exam.  If you are 40 or older, have a CBE every year. Also consider having a breast X-ray (mammogram) every year.  If you have a family history of breast cancer, talk to your health care provider about genetic screening.  If you   are at high risk for breast cancer, talk to your health care provider about having an MRI and a mammogram every year.  Breast cancer gene (BRCA) assessment is recommended for women who have family members with BRCA-related cancers. BRCA-related cancers include:  Breast.  Ovarian.  Tubal.  Peritoneal cancers.  Results of the assessment will determine the need for genetic counseling and BRCA1 and BRCA2 testing. Cervical Cancer Your health care provider may recommend that you be screened regularly for cancer of the pelvic organs (ovaries, uterus, and  vagina). This screening involves a pelvic examination, including checking for microscopic changes to the surface of your cervix (Pap test). You may be encouraged to have this screening done every 3 years, beginning at age 21.  For women ages 30-65, health care providers may recommend pelvic exams and Pap testing every 3 years, or they may recommend the Pap and pelvic exam, combined with testing for human papilloma virus (HPV), every 5 years. Some types of HPV increase your risk of cervical cancer. Testing for HPV may also be done on women of any age with unclear Pap test results.  Other health care providers may not recommend any screening for nonpregnant women who are considered low risk for pelvic cancer and who do not have symptoms. Ask your health care provider if a screening pelvic exam is right for you.  If you have had past treatment for cervical cancer or a condition that could lead to cancer, you need Pap tests and screening for cancer for at least 20 years after your treatment. If Pap tests have been discontinued, your risk factors (such as having a new sexual partner) need to be reassessed to determine if screening should resume. Some women have medical problems that increase the chance of getting cervical cancer. In these cases, your health care provider may recommend more frequent screening and Pap tests. Colorectal Cancer  This type of cancer can be detected and often prevented.  Routine colorectal cancer screening usually begins at 79 years of age and continues through 79 years of age.  Your health care provider may recommend screening at an earlier age if you have risk factors for colon cancer.  Your health care provider may also recommend using home test kits to check for hidden blood in the stool.  A small camera at the end of a tube can be used to examine your colon directly (sigmoidoscopy or colonoscopy). This is done to check for the earliest forms of colorectal  cancer.  Routine screening usually begins at age 50.  Direct examination of the colon should be repeated every 5-10 years through 79 years of age. However, you may need to be screened more often if early forms of precancerous polyps or small growths are found. Skin Cancer  Check your skin from head to toe regularly.  Tell your health care provider about any new moles or changes in moles, especially if there is a change in a mole's shape or color.  Also tell your health care provider if you have a mole that is larger than the size of a pencil eraser.  Always use sunscreen. Apply sunscreen liberally and repeatedly throughout the day.  Protect yourself by wearing long sleeves, pants, a wide-brimmed hat, and sunglasses whenever you are outside. HEART DISEASE, DIABETES, AND HIGH BLOOD PRESSURE   High blood pressure causes heart disease and increases the risk of stroke. High blood pressure is more likely to develop in:  People who have blood pressure in the high end   of the normal range (130-139/85-89 mm Hg).  People who are overweight or obese.  People who are African American.  If you are 38-23 years of age, have your blood pressure checked every 3-5 years. If you are 61 years of age or older, have your blood pressure checked every year. You should have your blood pressure measured twice--once when you are at a hospital or clinic, and once when you are not at a hospital or clinic. Record the average of the two measurements. To check your blood pressure when you are not at a hospital or clinic, you can use:  An automated blood pressure machine at a pharmacy.  A home blood pressure monitor.  If you are between 45 years and 39 years old, ask your health care provider if you should take aspirin to prevent strokes.  Have regular diabetes screenings. This involves taking a blood sample to check your fasting blood sugar level.  If you are at a normal weight and have a low risk for diabetes,  have this test once every three years after 79 years of age.  If you are overweight and have a high risk for diabetes, consider being tested at a younger age or more often. PREVENTING INFECTION  Hepatitis B  If you have a higher risk for hepatitis B, you should be screened for this virus. You are considered at high risk for hepatitis B if:  You were born in a country where hepatitis B is common. Ask your health care provider which countries are considered high risk.  Your parents were born in a high-risk country, and you have not been immunized against hepatitis B (hepatitis B vaccine).  You have HIV or AIDS.  You use needles to inject street drugs.  You live with someone who has hepatitis B.  You have had sex with someone who has hepatitis B.  You get hemodialysis treatment.  You take certain medicines for conditions, including cancer, organ transplantation, and autoimmune conditions. Hepatitis C  Blood testing is recommended for:  Everyone born from [birth year] through 1963.  Anyone with known risk factors for hepatitis C. Sexually transmitted infections (STIs)  You should be screened for sexually transmitted infections (STIs) including gonorrhea and chlamydia if:  You are sexually active and are younger than 79 years of age.  You are older than 79 years of age and your health care provider tells you that you are at risk for this type of infection.  Your sexual activity has changed since you were last screened and you are at an increased risk for chlamydia or gonorrhea. Ask your health care provider if you are at risk.  If you do not have HIV, but are at risk, it may be recommended that you take a prescription medicine daily to prevent HIV infection. This is called pre-exposure prophylaxis (PrEP). You are considered at risk if:  You are sexually active and do not regularly use condoms or know the HIV status of your partner(s).  You take drugs by injection.  You are sexually  active with a partner who has HIV. Talk with your health care provider about whether you are at high risk of being infected with HIV. If you choose to begin PrEP, you should first be tested for HIV. You should then be tested every 3 months for as long as you are taking PrEP.  PREGNANCY   If you are premenopausal and you may become pregnant, ask your health care provider about preconception counseling.  If you may  become pregnant, take 400 to 800 micrograms (mcg) of folic acid every day.  If you want to prevent pregnancy, talk to your health care provider about birth control (contraception). OSTEOPOROSIS AND MENOPAUSE   Osteoporosis is a disease in which the bones lose minerals and strength with aging. This can result in serious bone fractures. Your risk for osteoporosis can be identified using a bone density scan.  If you are 61 years of age or older, or if you are at risk for osteoporosis and fractures, ask your health care provider if you should be screened.  Ask your health care provider whether you should take a calcium or vitamin D supplement to lower your risk for osteoporosis.  Menopause may have certain physical symptoms and risks.  Hormone replacement therapy may reduce some of these symptoms and risks. Talk to your health care provider about whether hormone replacement therapy is right for you.  HOME CARE INSTRUCTIONS   Schedule regular health, dental, and eye exams.  Stay current with your immunizations.   Do not use any tobacco products including cigarettes, chewing tobacco, or electronic cigarettes.  If you are pregnant, do not drink alcohol.  If you are breastfeeding, limit how much and how often you drink alcohol.  Limit alcohol intake to no more than 1 drink per day for nonpregnant women. One drink equals 12 ounces of beer, 5 ounces of wine, or 1 ounces of hard liquor.  Do not use street drugs.  Do not share needles.  Ask your health care provider for help if  you need support or information about quitting drugs.  Tell your health care provider if you often feel depressed.  Tell your health care provider if you have ever been abused or do not feel safe at home.   This information is not intended to replace advice given to you by your health care provider. Make sure you discuss any questions you have with your health care provider.   Document Released: 08/02/2010 Document Revised: 02/07/2014 Document Reviewed: 12/19/2012 Elsevier Interactive Patient Education [employer].

## 2014-12-04 NOTE — Progress Notes (Addendum)
Subjective:    Patient ID: Wendy Robles, female    DOB: 10-12-1929, 81 y.o.   MRN: [id number]  Patient here today for follow up of chronic medical problems. No acute complaints.   Hyperlipidemia This is a new problem. The current episode started more than 1 year ago. The problem is controlled. Recent lipid tests were reviewed and are normal. She has no history of diabetes, hypothyroidism or obesity. Pertinent negatives include no chest pain, myalgias or shortness of breath. Current antihyperlipidemic treatment includes statins. The current treatment provides no improvement of lipids. Compliance problems include adherence to diet and adherence to exercise.  Risk factors for coronary artery disease include dyslipidemia and family history.  Insomnia Ambien 5 mg - states this is no longer working for her. Has difficulty initiating sleep and if she does go to sleep, she will wake up after several hours. Has been trying to limit pills to 3x a week but is now waking up due to pain. AMbien works well when she is not hurting Neuropathy bil lower ext Feet and legs just burn and hurt at night. She has tried neurotin but could not tolerate it. But pain is getting unbearable. Osteopenia Takes vitamin d daily- no weight bearing exercise because she is unsteady on her feet and walks with a walker.   Review of Systems  Constitutional: Negative.  Negative for fatigue and unexpected weight change.  HENT: Negative.   Eyes: Negative.   Respiratory: Negative.  Negative for shortness of breath.   Cardiovascular: Negative.  Negative for chest pain and palpitations.  Gastrointestinal: Negative.  Negative for diarrhea and constipation.  Endocrine: Negative.   Genitourinary: Negative.   Musculoskeletal: Negative for myalgias.          Skin: Negative.   Allergic/Immunologic: Negative.   Neurological: Negative.   Hematological: Negative.   Psychiatric/Behavioral: Positive for sleep disturbance.  All other  systems reviewed and are negative.      Objective:   Physical Exam  Constitutional: She is oriented to person, place, and time. She appears well-developed and well-nourished.  HENT:  Nose: Nose normal.  Mouth/Throat: Oropharynx is clear and moist.  Eyes: EOM are normal.  Neck: Trachea normal, normal range of motion and full passive range of motion without pain. Neck supple. No JVD present. Carotid bruit is not present. No thyromegaly present.  Cardiovascular: Normal rate, regular rhythm, normal heart sounds and intact distal pulses.  Exam reveals no gallop and no friction rub.   No murmur heard. Pulmonary/Chest: Effort normal and breath sounds normal.  Abdominal: Soft. Bowel sounds are normal. She exhibits no distension and no mass. There is no tenderness.  Musculoskeletal: Normal range of motion.  Ambulate with a walker.   Lymphadenopathy:    She has no cervical adenopathy.  Neurological: She is alert and oriented to person, place, and time. She has normal reflexes.  Skin: Skin is warm and dry.  Psychiatric: She has a normal mood and affect. Her behavior is normal. Judgment and thought content normal.    BP 134/82 mmHg  Pulse 93  Temp(Src) 98.1 F (36.7 C) (Oral)  Ht _0  (1.676 m)  Wt 185 lb (83.915 kg)  BMI 29.87 kg/m2      Assessment & Plan:  1. Hereditary and idiopathic peripheral neuropathy  2. Insomnia Bedtime ritual - zolpidem (AMBIEN) 5 MG tablet; TAKE ONE TABLET BY MOUTH AT BEDTIME  Dispense: 30 tablet; Refill: 2  3. Hyperlipidemia with target LDL less than 100 low fat diet  and exercise  4. BMI 29.0-29.9,adult Discussed diet and exercise for person with BMI >25 Will recheck weight in 3-6 months  Orders Placed This Encounter  Procedures  . CMP14+EGFR  . Lipid panel      Labs pending Health maintenance reviewed Diet and exercise encouraged Continue all meds Follow up  In 3 month   [place], FNP

## 2014-12-04 NOTE — Addendum Note (Signed)
Addended by: Chevis Pretty on: 12/04/2014 04:10 PM   Modules accepted: Orders

## 2014-12-05 LAB — LIPID PANEL
CHOLESTEROL TOTAL: 138 mg/dL (ref 100–199)
Chol/HDL Ratio: 2.9 ratio units (ref 0.0–4.4)
HDL: 48 mg/dL (ref >39)
LDL Calculated: 71 mg/dL (ref 0–99)
TRIGLYCERIDES: 95 mg/dL (ref 0–149)
VLDL Cholesterol Cal: 19 mg/dL (ref 5–40)

## 2014-12-05 LAB — CMP14+EGFR
ALT: 30 IU/L (ref 0–32)
AST: 31 IU/L (ref 0–40)
Albumin/Globulin Ratio: 1.6 (ref 1.1–2.5)
Albumin: 4.2 g/dL (ref 3.5–4.7)
Alkaline Phosphatase: 57 IU/L (ref 39–117)
BUN/Creatinine Ratio: 23 (ref 11–26)
BUN: 14 mg/dL (ref 8–27)
Bilirubin Total: 0.5 mg/dL (ref 0.0–1.2)
CALCIUM: 10.2 mg/dL (ref 8.7–10.3)
CHLORIDE: 100 mmol/L (ref 97–106)
CO2: 29 mmol/L (ref 18–29)
Creatinine, Ser: 0.62 mg/dL (ref 0.57–1.00)
GFR calc Af Amer: 96 mL/min/{1.73_m2} (ref >59)
GFR calc non Af Amer: 84 mL/min/{1.73_m2} (ref >59)
Globulin, Total: 2.7 g/dL (ref 1.5–4.5)
Glucose: 100 mg/dL — ABNORMAL HIGH (ref 65–99)
Potassium: 4 mmol/L (ref 3.5–5.2)
SODIUM: 143 mmol/L (ref 136–144)
TOTAL PROTEIN: 6.9 g/dL (ref 6.0–8.5)

## 2014-12-08 DIAGNOSIS — Z23 Encounter for immunization: Secondary | ICD-10-CM

## 2015-01-21 ENCOUNTER — Telehealth: Payer: Self-pay | Admitting: Nurse Practitioner

## 2015-01-23 NOTE — Telephone Encounter (Signed)
Mailed out this am, pt aware

## 2015-03-10 ENCOUNTER — Ambulatory Visit (INDEPENDENT_AMBULATORY_CARE_PROVIDER_SITE_OTHER): Payer: PPO | Admitting: Nurse Practitioner

## 2015-03-10 ENCOUNTER — Encounter: Payer: Self-pay | Admitting: Nurse Practitioner

## 2015-03-10 VITALS — BP 136/89 | HR 92 | Temp 97.5°F | Ht 66.0 in | Wt 190.0 lb

## 2015-03-10 DIAGNOSIS — G4700 Insomnia, unspecified: Secondary | ICD-10-CM | POA: Diagnosis not present

## 2015-03-10 DIAGNOSIS — E785 Hyperlipidemia, unspecified: Secondary | ICD-10-CM | POA: Diagnosis not present

## 2015-03-10 DIAGNOSIS — G609 Hereditary and idiopathic neuropathy, unspecified: Secondary | ICD-10-CM | POA: Diagnosis not present

## 2015-03-10 DIAGNOSIS — Z6829 Body mass index (BMI) 29.0-29.9, adult: Secondary | ICD-10-CM

## 2015-03-10 MED ORDER — ZOLPIDEM TARTRATE 5 MG PO TABS: ORAL_TABLET | ORAL | Status: DC

## 2015-03-10 MED ORDER — ATORVASTATIN CALCIUM 40 MG PO TABS: 40.0000 mg | ORAL_TABLET | Freq: Every day | ORAL | Status: DC

## 2015-03-10 NOTE — Addendum Note (Signed)
Addended by: Chevis Pretty on: 03/10/2015 10:47 AM   Modules accepted: Orders, Medications

## 2015-03-10 NOTE — Progress Notes (Signed)
Subjective:    Patient ID: Wendy Robles, female    DOB: Sep 08, 1929, 82 y.o.   MRN: [id number]  Patient here today for follow up of chronic medical problems.  Outpatient Encounter Prescriptions as of 03/10/2015  Medication Sig  . aspirin 81 MG tablet Take 81 mg by mouth daily.    Marland Kitchen atorvastatin (LIPITOR) 40 MG tablet Take 1 tablet (40 mg total) by mouth daily.  . Calcium Carbonate-Vitamin D (CALTRATE 600+D) 600-400 MG-UNIT per chew tablet Chew 1 tablet by mouth daily.    . Cholecalciferol (VITAMIN D) 2000 UNITS CAPS Take by mouth 2 (two) times daily.    . Multiple Vitamin (MULTIVITAMIN) capsule Take 1 capsule by mouth daily.    Marland Kitchen omega-3 acid ethyl esters (LOVAZA) 1 G capsule Take 2 capsules (2 g total) by mouth 2 (two) times daily.  . polyethylene glycol (MIRALAX / GLYCOLAX) packet Take 17 g by mouth as needed.    . zolpidem (AMBIEN) 5 MG tablet TAKE ONE TABLET BY MOUTH AT BEDTIME   No facility-administered encounter medications on file as of 03/10/2015.      Hyperlipidemia This is a new problem. The current episode started more than 1 year ago. The problem is controlled. Recent lipid tests were reviewed and are normal. She has no history of diabetes, hypothyroidism or obesity. Pertinent negatives include no chest pain, myalgias or shortness of breath. Current antihyperlipidemic treatment includes statins. The current treatment provides no improvement of lipids. Compliance problems include adherence to diet and adherence to exercise.  Risk factors for coronary artery disease include dyslipidemia and family history.  Insomnia Ambien 5 mg - states this is no longer working for her. Has difficulty initiating sleep and if she does go to sleep, she will wake up after several hours. Has been trying to limit pills to 3x a week but is now waking up due to pain. AMbien works well when she is not hurting Neuropathy bil lower ext Feet and legs just burn and hurt at night. She has tried neurotin but  could not tolerate it. But pain is getting unbearable. Osteopenia Takes vitamin d daily- no weight bearing exercise because she is unsteady on her feet and walks with a walker.   Review of Systems  Constitutional: Negative.  Negative for fatigue and unexpected weight change.  HENT: Negative.   Eyes: Negative.   Respiratory: Negative.  Negative for shortness of breath.   Cardiovascular: Negative.  Negative for chest pain and palpitations.  Gastrointestinal: Negative.  Negative for diarrhea and constipation.  Endocrine: Negative.   Genitourinary: Negative.   Musculoskeletal: Negative for myalgias.          Skin: Negative.   Allergic/Immunologic: Negative.   Neurological: Negative.   Hematological: Negative.   Psychiatric/Behavioral: Positive for sleep disturbance.  All other systems reviewed and are negative.      Objective:   Physical Exam  Constitutional: She is oriented to person, place, and time. She appears well-developed and well-nourished.  HENT:  Nose: Nose normal.  Mouth/Throat: Oropharynx is clear and moist.  Eyes: EOM are normal.  Neck: Trachea normal, normal range of motion and full passive range of motion without pain. Neck supple. No JVD present. Carotid bruit is not present. No thyromegaly present.  Cardiovascular: Normal rate, regular rhythm, normal heart sounds and intact distal pulses.  Exam reveals no gallop and no friction rub.   No murmur heard. Pulmonary/Chest: Effort normal and breath sounds normal.  Abdominal: Soft. Bowel sounds are normal. She exhibits  no distension and no mass. There is no tenderness.  Musculoskeletal: Normal range of motion.  Ambulate with a walker.   Lymphadenopathy:    She has no cervical adenopathy.  Neurological: She is alert and oriented to person, place, and time. She has normal reflexes.  Skin: Skin is warm and dry.  Psychiatric: She has a normal mood and affect. Her behavior is normal. Judgment and thought content normal.     BP 136/89 mmHg  Pulse 92  Temp(Src) 97.5 F (36.4 C) (Oral)  Ht '5\' 6"'$  (1.676 m)  Wt 190 lb (86.183 kg)  BMI 30.68 kg/m2         Assessment & Plan:  1. Hereditary and idiopathic peripheral neuropathy Do not go barefooted  2. Insomnia Bedtime ritual - zolpidem (AMBIEN) 5 MG tablet; TAKE ONE TABLET BY MOUTH AT BEDTIME  Dispense: 30 tablet; Refill: 2  3. Hyperlipidemia with target LDL less than 100 Low fta diet - atorvastatin (LIPITOR) 40 MG tablet; Take 1 tablet (40 mg total) by mouth daily.  Dispense: 30 tablet; Refill: 5 - CMP14+EGFR - Lipid panel  4. BMI 29.0-29.9,adult Discussed diet and exercise for person with BMI >25 Will recheck weight in 3-6 months     Labs pending Health maintenance reviewed Diet and exercise encouraged Continue all meds Follow up  In 6 months   Gordon, FNP

## 2015-03-10 NOTE — Patient Instructions (Signed)
Health Maintenance, Female Adopting a healthy lifestyle and getting preventive care can go a long way to promote health and wellness. Talk with your health care provider about what schedule of regular examinations is right for you. This is a good chance for you to check in with your provider about disease prevention and staying healthy. In between checkups, there are plenty of things you can do on your own. Experts have done a lot of research about which lifestyle changes and preventive measures are most likely to keep you healthy. Ask your health care provider for more information. WEIGHT AND DIET  Eat a healthy diet  Be sure to include plenty of vegetables, fruits, low-fat dairy products, and lean protein.  Do not eat a lot of foods high in solid fats, added sugars, or salt.  Get regular exercise. This is one of the most important things you can do for your health.  Most adults should exercise for at least 150 minutes each week. The exercise should increase your heart rate and make you sweat (moderate-intensity exercise).  Most adults should also do strengthening exercises at least twice a week. This is in addition to the moderate-intensity exercise.  Maintain a healthy weight  Body mass index (BMI) is a measurement that can be used to identify possible weight problems. It estimates body fat based on height and weight. Your health care provider can help determine your BMI and help you achieve or maintain a healthy weight.  For females 20 years of age and older:   A BMI below 18.5 is considered underweight.  A BMI of 18.5 to 24.9 is normal.  A BMI of 25 to 29.9 is considered overweight.  A BMI of 30 and above is considered obese.  Watch levels of cholesterol and blood lipids  You should start having your blood tested for lipids and cholesterol at 80 years of age, then have this test every 5 years.  You may need to have your cholesterol levels checked more often if:  Your lipid  or cholesterol levels are high.  You are older than 80 years of age.  You are at high risk for heart disease.  CANCER SCREENING   Lung Cancer  Lung cancer screening is recommended for adults 55-80 years old who are at high risk for lung cancer because of a history of smoking.  A yearly low-dose CT scan of the lungs is recommended for people who:  Currently smoke.  Have quit within the past 15 years.  Have at least a 30-pack-year history of smoking. A pack year is smoking an average of one pack of cigarettes a day for 1 year.  Yearly screening should continue until it has been 15 years since you quit.  Yearly screening should stop if you develop a health problem that would prevent you from having lung cancer treatment.  Breast Cancer  Practice breast self-awareness. This means understanding how your breasts normally appear and feel.  It also means doing regular breast self-exams. Let your health care provider know about any changes, no matter how small.  If you are in your 20s or 30s, you should have a clinical breast exam (CBE) by a health care provider every 1-3 years as part of a regular health exam.  If you are 40 or older, have a CBE every year. Also consider having a breast X-ray (mammogram) every year.  If you have a family history of breast cancer, talk to your health care provider about genetic screening.  If you   are at high risk for breast cancer, talk to your health care provider about having an MRI and a mammogram every year.  Breast cancer gene (BRCA) assessment is recommended for women who have family members with BRCA-related cancers. BRCA-related cancers include:  Breast.  Ovarian.  Tubal.  Peritoneal cancers.  Results of the assessment will determine the need for genetic counseling and BRCA1 and BRCA2 testing. Cervical Cancer Your health care provider may recommend that you be screened regularly for cancer of the pelvic organs (ovaries, uterus, and  vagina). This screening involves a pelvic examination, including checking for microscopic changes to the surface of your cervix (Pap test). You may be encouraged to have this screening done every 3 years, beginning at age 21.  For women ages 30-65, health care providers may recommend pelvic exams and Pap testing every 3 years, or they may recommend the Pap and pelvic exam, combined with testing for human papilloma virus (HPV), every 5 years. Some types of HPV increase your risk of cervical cancer. Testing for HPV may also be done on women of any age with unclear Pap test results.  Other health care providers may not recommend any screening for nonpregnant women who are considered low risk for pelvic cancer and who do not have symptoms. Ask your health care provider if a screening pelvic exam is right for you.  If you have had past treatment for cervical cancer or a condition that could lead to cancer, you need Pap tests and screening for cancer for at least 20 years after your treatment. If Pap tests have been discontinued, your risk factors (such as having a new sexual partner) need to be reassessed to determine if screening should resume. Some women have medical problems that increase the chance of getting cervical cancer. In these cases, your health care provider may recommend more frequent screening and Pap tests. Colorectal Cancer  This type of cancer can be detected and often prevented.  Routine colorectal cancer screening usually begins at 80 years of age and continues through 80 years of age.  Your health care provider may recommend screening at an earlier age if you have risk factors for colon cancer.  Your health care provider may also recommend using home test kits to check for hidden blood in the stool.  A small camera at the end of a tube can be used to examine your colon directly (sigmoidoscopy or colonoscopy). This is done to check for the earliest forms of colorectal  cancer.  Routine screening usually begins at age 50.  Direct examination of the colon should be repeated every 5-10 years through 80 years of age. However, you may need to be screened more often if early forms of precancerous polyps or small growths are found. Skin Cancer  Check your skin from head to toe regularly.  Tell your health care provider about any new moles or changes in moles, especially if there is a change in a mole's shape or color.  Also tell your health care provider if you have a mole that is larger than the size of a pencil eraser.  Always use sunscreen. Apply sunscreen liberally and repeatedly throughout the day.  Protect yourself by wearing long sleeves, pants, a wide-brimmed hat, and sunglasses whenever you are outside. HEART DISEASE, DIABETES, AND HIGH BLOOD PRESSURE   High blood pressure causes heart disease and increases the risk of stroke. High blood pressure is more likely to develop in:  People who have blood pressure in the high end   of the normal range (130-139/85-89 mm Hg).  People who are overweight or obese.  People who are African American.  If you are 38-23 years of age, have your blood pressure checked every 3-5 years. If you are 61 years of age or older, have your blood pressure checked every year. You should have your blood pressure measured twice--once when you are at a hospital or clinic, and once when you are not at a hospital or clinic. Record the average of the two measurements. To check your blood pressure when you are not at a hospital or clinic, you can use:  An automated blood pressure machine at a pharmacy.  A home blood pressure monitor.  If you are between 45 years and 39 years old, ask your health care provider if you should take aspirin to prevent strokes.  Have regular diabetes screenings. This involves taking a blood sample to check your fasting blood sugar level.  If you are at a normal weight and have a low risk for diabetes,  have this test once every three years after 80 years of age.  If you are overweight and have a high risk for diabetes, consider being tested at a younger age or more often. PREVENTING INFECTION  Hepatitis B  If you have a higher risk for hepatitis B, you should be screened for this virus. You are considered at high risk for hepatitis B if:  You were born in a country where hepatitis B is common. Ask your health care provider which countries are considered high risk.  Your parents were born in a high-risk country, and you have not been immunized against hepatitis B (hepatitis B vaccine).  You have HIV or AIDS.  You use needles to inject street drugs.  You live with someone who has hepatitis B.  You have had sex with someone who has hepatitis B.  You get hemodialysis treatment.  You take certain medicines for conditions, including cancer, organ transplantation, and autoimmune conditions. Hepatitis C  Blood testing is recommended for:  Everyone born from [birth year] through 1963.  Anyone with known risk factors for hepatitis C. Sexually transmitted infections (STIs)  You should be screened for sexually transmitted infections (STIs) including gonorrhea and chlamydia if:  You are sexually active and are younger than 80 years of age.  You are older than 80 years of age and your health care provider tells you that you are at risk for this type of infection.  Your sexual activity has changed since you were last screened and you are at an increased risk for chlamydia or gonorrhea. Ask your health care provider if you are at risk.  If you do not have HIV, but are at risk, it may be recommended that you take a prescription medicine daily to prevent HIV infection. This is called pre-exposure prophylaxis (PrEP). You are considered at risk if:  You are sexually active and do not regularly use condoms or know the HIV status of your partner(s).  You take drugs by injection.  You are sexually  active with a partner who has HIV. Talk with your health care provider about whether you are at high risk of being infected with HIV. If you choose to begin PrEP, you should first be tested for HIV. You should then be tested every 3 months for as long as you are taking PrEP.  PREGNANCY   If you are premenopausal and you may become pregnant, ask your health care provider about preconception counseling.  If you may  become pregnant, take 400 to 800 micrograms (mcg) of folic acid every day.  If you want to prevent pregnancy, talk to your health care provider about birth control (contraception). OSTEOPOROSIS AND MENOPAUSE   Osteoporosis is a disease in which the bones lose minerals and strength with aging. This can result in serious bone fractures. Your risk for osteoporosis can be identified using a bone density scan.  If you are 61 years of age or older, or if you are at risk for osteoporosis and fractures, ask your health care provider if you should be screened.  Ask your health care provider whether you should take a calcium or vitamin D supplement to lower your risk for osteoporosis.  Menopause may have certain physical symptoms and risks.  Hormone replacement therapy may reduce some of these symptoms and risks. Talk to your health care provider about whether hormone replacement therapy is right for you.  HOME CARE INSTRUCTIONS   Schedule regular health, dental, and eye exams.  Stay current with your immunizations.   Do not use any tobacco products including cigarettes, chewing tobacco, or electronic cigarettes.  If you are pregnant, do not drink alcohol.  If you are breastfeeding, limit how much and how often you drink alcohol.  Limit alcohol intake to no more than 1 drink per day for nonpregnant women. One drink equals 12 ounces of beer, 5 ounces of wine, or 1 ounces of hard liquor.  Do not use street drugs.  Do not share needles.  Ask your health care provider for help if  you need support or information about quitting drugs.  Tell your health care provider if you often feel depressed.  Tell your health care provider if you have ever been abused or do not feel safe at home.   This information is not intended to replace advice given to you by your health care provider. Make sure you discuss any questions you have with your health care provider.   Document Released: 08/02/2010 Document Revised: 02/07/2014 Document Reviewed: 12/19/2012 Elsevier Interactive Patient Education [employer].

## 2015-03-11 LAB — CMP14+EGFR
ALK PHOS: 57 IU/L (ref 39–117)
ALT: 30 IU/L (ref 0–32)
AST: 28 IU/L (ref 0–40)
Albumin/Globulin Ratio: 2 (ref 1.1–2.5)
Albumin: 4.5 g/dL (ref 3.5–4.7)
BILIRUBIN TOTAL: 0.5 mg/dL (ref 0.0–1.2)
BUN / CREAT RATIO: 23 (ref 11–26)
BUN: 16 mg/dL (ref 8–27)
CHLORIDE: 102 mmol/L (ref 96–106)
CO2: 31 mmol/L — AB (ref 18–29)
Calcium: 9.9 mg/dL (ref 8.7–10.3)
Creatinine, Ser: 0.7 mg/dL (ref 0.57–1.00)
GFR calc Af Amer: 93 mL/min/{1.73_m2} (ref >59)
GFR calc non Af Amer: 80 mL/min/{1.73_m2} (ref >59)
GLUCOSE: 102 mg/dL — AB (ref 65–99)
Globulin, Total: 2.2 g/dL (ref 1.5–4.5)
Potassium: 4.3 mmol/L (ref 3.5–5.2)
Sodium: 143 mmol/L (ref 134–144)
Total Protein: 6.7 g/dL (ref 6.0–8.5)

## 2015-03-11 LAB — LIPID PANEL
CHOLESTEROL TOTAL: 140 mg/dL (ref 100–199)
Chol/HDL Ratio: 3.2 ratio units (ref 0.0–4.4)
HDL: 44 mg/dL (ref >39)
LDL Calculated: 68 mg/dL (ref 0–99)
Triglycerides: 142 mg/dL (ref 0–149)
VLDL CHOLESTEROL CAL: 28 mg/dL (ref 5–40)

## 2015-05-05 DIAGNOSIS — Z0289 Encounter for other administrative examinations: Secondary | ICD-10-CM

## 2015-06-02 ENCOUNTER — Other Ambulatory Visit: Payer: Self-pay | Admitting: Nurse Practitioner

## 2015-06-25 ENCOUNTER — Ambulatory Visit (INDEPENDENT_AMBULATORY_CARE_PROVIDER_SITE_OTHER): Payer: PPO | Admitting: Nurse Practitioner

## 2015-06-25 ENCOUNTER — Telehealth: Payer: Self-pay | Admitting: Nurse Practitioner

## 2015-06-25 ENCOUNTER — Encounter: Payer: Self-pay | Admitting: Nurse Practitioner

## 2015-06-25 VITALS — BP 138/88 | HR 89 | Temp 97.3°F | Ht 66.0 in | Wt 190.0 lb

## 2015-06-25 DIAGNOSIS — E785 Hyperlipidemia, unspecified: Secondary | ICD-10-CM

## 2015-06-25 DIAGNOSIS — G4700 Insomnia, unspecified: Secondary | ICD-10-CM | POA: Diagnosis not present

## 2015-06-25 DIAGNOSIS — M8580 Other specified disorders of bone density and structure, unspecified site: Secondary | ICD-10-CM | POA: Diagnosis not present

## 2015-06-25 DIAGNOSIS — G609 Hereditary and idiopathic neuropathy, unspecified: Secondary | ICD-10-CM | POA: Diagnosis not present

## 2015-06-25 DIAGNOSIS — Z6830 Body mass index (BMI) 30.0-30.9, adult: Secondary | ICD-10-CM | POA: Diagnosis not present

## 2015-06-25 MED ORDER — ZOLPIDEM TARTRATE 5 MG PO TABS: ORAL_TABLET | ORAL | Status: DC

## 2015-06-25 NOTE — Progress Notes (Signed)
Subjective:    Patient ID: Wendy Robles, female    DOB: 1929-02-12, 82 y.o.   MRN: [id number]  Patient here today for follow up of chronic medical problems.  Outpatient Encounter Prescriptions as of 06/25/2015  Medication Sig  . aspirin 81 MG tablet Take 81 mg by mouth daily.    Marland Kitchen atorvastatin (LIPITOR) 40 MG tablet Take 1 tablet (40 mg total) by mouth daily.  . Calcium Carbonate-Vitamin D (CALTRATE 600+D) 600-400 MG-UNIT per chew tablet Chew 1 tablet by mouth daily.    . Cholecalciferol (VITAMIN D) 2000 UNITS CAPS Take by mouth 2 (two) times daily.    . Multiple Vitamin (MULTIVITAMIN) capsule Take 1 capsule by mouth daily.    . polyethylene glycol (MIRALAX / GLYCOLAX) packet Take 17 g by mouth as needed.    . zolpidem (AMBIEN) 5 MG tablet TAKE ONE TABLET BY MOUTH AT BEDTIME    Hyperlipidemia This is a new problem. The current episode started more than 1 year ago. The problem is controlled. Recent lipid tests were reviewed and are normal. She has no history of diabetes, hypothyroidism or obesity. Pertinent negatives include no chest pain, myalgias or shortness of breath. Current antihyperlipidemic treatment includes statins. The current treatment provides no improvement of lipids. Compliance problems include adherence to diet and adherence to exercise.  Risk factors for coronary artery disease include dyslipidemia and family history.  Insomnia Ambien 5 mg - states this is no longer working for her. Has difficulty initiating sleep and if she does go to sleep, she will wake up after several hours. Has been trying to limit pills to 3x a week but is now waking up due to pain. AMbien works well when she is not hurting Neuropathy bil lower ext Feet and legs just burn and hurt at night. She has tried neurotin but could not tolerate it. But pain is getting unbearable. Osteopenia Takes vitamin d daily- no weight bearing exercise because she is unsteady on her feet and walks with a  walker.   Review of Systems  Constitutional: Negative.  Negative for fatigue and unexpected weight change.  HENT: Negative.   Eyes: Negative.   Respiratory: Negative.  Negative for shortness of breath.   Cardiovascular: Negative.  Negative for chest pain and palpitations.  Gastrointestinal: Negative.  Negative for diarrhea and constipation.  Endocrine: Negative.   Genitourinary: Negative.   Musculoskeletal: Negative for myalgias.          Skin: Negative.   Allergic/Immunologic: Negative.   Neurological: Negative.   Hematological: Negative.   Psychiatric/Behavioral: Positive for sleep disturbance.  All other systems reviewed and are negative.      Objective:   Physical Exam  Constitutional: She is oriented to person, place, and time. She appears well-developed and well-nourished.  HENT:  Nose: Nose normal.  Mouth/Throat: Oropharynx is clear and moist.  Eyes: EOM are normal.  Neck: Trachea normal, normal range of motion and full passive range of motion without pain. Neck supple. No JVD present. Carotid bruit is not present. No thyromegaly present.  Cardiovascular: Normal rate, regular rhythm, normal heart sounds and intact distal pulses.  Exam reveals no gallop and no friction rub.   No murmur heard. Pulmonary/Chest: Effort normal and breath sounds normal.  Abdominal: Soft. Bowel sounds are normal. She exhibits no distension and no mass. There is no tenderness.  Musculoskeletal: Normal range of motion.  Ambulate with a walker.   Lymphadenopathy:    She has no cervical adenopathy.  Neurological: She is  alert and oriented to person, place, and time. She has normal reflexes.  Skin: Skin is warm and dry.  Psychiatric: She has a normal mood and affect. Her behavior is normal. Judgment and thought content normal.   BP 138/88 mmHg  Pulse 89  Temp(Src) 97.3 F (36.3 C) (Oral)  Ht 5\' 6"  (1.676 m)  Wt 190 lb (86.183 kg)  BMI 30.68 kg/m2      Assessment & Plan:   1.  Hereditary and idiopathic peripheral neuropathy Continue to walk with walker  2. Insomnia Bedtime ritual - zolpidem (AMBIEN) 5 MG tablet; TAKE ONE TABLET BY MOUTH AT BEDTIME  Dispense: 30 tablet; Refill: 2  3. Hyperlipidemia with target LDL less than 100 Low fat diet and exercise  4. BMI 30.0-30.9,adult Discussed diet and exercise for person with BMI >25 Will recheck weight in 3-6 months  5. Osteopenia Weight bearing exercises if can tolerate    Labs pending Health maintenance reviewed Diet and exercise encouraged Continue all meds Follow up  In 6 month   [place], FNP

## 2015-06-25 NOTE — Addendum Note (Signed)
Addended by: Rolena Infante on: 06/25/2015 10:15 AM   Modules accepted: Orders

## 2015-06-25 NOTE — Patient Instructions (Signed)
Fall Prevention in the Home  Falls can cause injuries and can affect people from all age groups. There are many simple things that you can do to make your home safe and to help prevent falls. WHAT CAN I DO ON THE OUTSIDE OF MY HOME?  Regularly repair the edges of walkways and driveways and fix any cracks.  Remove high doorway thresholds.  Trim any shrubbery on the main path into your home.  Use bright outdoor lighting.  Clear walkways of debris and clutter, including tools and rocks.  Regularly check that handrails are securely fastened and in good repair. Both sides of any steps should have handrails.  Install guardrails along the edges of any raised decks or porches.  Have leaves, snow, and ice cleared regularly.  Use sand or salt on walkways during winter months.  In the garage, clean up any spills right away, including grease or oil spills. WHAT CAN I DO IN THE BATHROOM?  Use night lights.  Install grab bars by the toilet and in the tub and shower. Do not use towel bars as grab bars.  Use non-skid mats or decals on the floor of the tub or shower.  If you need to sit down while you are in the shower, use a plastic, non-slip stool..  Keep the floor dry. Immediately clean up any water that spills on the floor.  Remove soap buildup in the tub or shower on a regular basis.  Attach bath mats securely with double-sided non-slip rug tape.  Remove throw rugs and other tripping hazards from the floor. WHAT CAN I DO IN THE BEDROOM?  Use night lights.  Make sure that a bedside light is easy to reach.  Do not use oversized bedding that drapes onto the floor.  Have a firm chair that has side arms to use for getting dressed.  Remove throw rugs and other tripping hazards from the floor. WHAT CAN I DO IN THE KITCHEN?   Clean up any spills right away.  Avoid walking on wet floors.  Place frequently used items in easy-to-reach places.  If you need to reach for something  above you, use a sturdy step stool that has a grab bar.  Keep electrical cables out of the way.  Do not use floor polish or wax that makes floors slippery. If you have to use wax, make sure that it is non-skid floor wax.  Remove throw rugs and other tripping hazards from the floor. WHAT CAN I DO IN THE STAIRWAYS?  Do not leave any items on the stairs.  Make sure that there are handrails on both sides of the stairs. Fix handrails that are broken or loose. Make sure that handrails are as long as the stairways.  Check any carpeting to make sure that it is firmly attached to the stairs. Fix any carpet that is loose or worn.  Avoid having throw rugs at the top or bottom of stairways, or secure the rugs with carpet tape to prevent them from moving.  Make sure that you have a light switch at the top of the stairs and the bottom of the stairs. If you do not have them, have them installed. WHAT ARE SOME OTHER FALL PREVENTION TIPS?  Wear closed-toe shoes that fit well and support your feet. Wear shoes that have rubber soles or low heels.  When you use a stepladder, make sure that it is completely opened and that the sides are firmly locked. Have someone hold the ladder while you   are using it. Do not climb a closed stepladder.  Add color or contrast paint or tape to grab bars and handrails in your home. Place contrasting color strips on the first and last steps.  Use mobility aids as needed, such as canes, walkers, scooters, and crutches.  Turn on lights if it is dark. Replace any light bulbs that burn out.  Set up furniture so that there are clear paths. Keep the furniture in the same spot.  Fix any uneven floor surfaces.  Choose a carpet design that does not hide the edge of steps of a stairway.  Be aware of any and all pets.  Review your medicines with your healthcare provider. Some medicines can cause dizziness or changes in blood pressure, which increase your risk of falling. Talk  with your health care provider about other ways that you can decrease your risk of falls. This may include working with a physical therapist or trainer to improve your strength, balance, and endurance.   This information is not intended to replace advice given to you by your health care provider. Make sure you discuss any questions you have with your health care provider.   Document Released: 01/07/2002 Document Revised: 06/03/2014 Document Reviewed: 02/21/2014 Elsevier Interactive Patient Education 2016 [employer]  

## 2015-06-26 LAB — CMP14+EGFR
ALK PHOS: 56 IU/L (ref 39–117)
ALT: 29 IU/L (ref 0–32)
AST: 27 IU/L (ref 0–40)
Albumin/Globulin Ratio: 1.7 (ref 1.2–2.2)
Albumin: 4.4 g/dL (ref 3.5–4.7)
BUN/Creatinine Ratio: 22 (ref 12–28)
BUN: 15 mg/dL (ref 8–27)
Bilirubin Total: 0.5 mg/dL (ref 0.0–1.2)
CALCIUM: 10.1 mg/dL (ref 8.7–10.3)
CO2: 26 mmol/L (ref 18–29)
CREATININE: 0.68 mg/dL (ref 0.57–1.00)
Chloride: 103 mmol/L (ref 96–106)
GFR calc Af Amer: 93 mL/min/{1.73_m2} (ref >59)
GFR, EST NON AFRICAN AMERICAN: 81 mL/min/{1.73_m2} (ref >59)
GLOBULIN, TOTAL: 2.6 g/dL (ref 1.5–4.5)
GLUCOSE: 101 mg/dL — AB (ref 65–99)
Potassium: 4.4 mmol/L (ref 3.5–5.2)
SODIUM: 146 mmol/L — AB (ref 134–144)
Total Protein: 7 g/dL (ref 6.0–8.5)

## 2015-06-26 LAB — LIPID PANEL
CHOL/HDL RATIO: 3.3 ratio (ref 0.0–4.4)
CHOLESTEROL TOTAL: 139 mg/dL (ref 100–199)
HDL: 42 mg/dL (ref >39)
LDL CALC: 68 mg/dL (ref 0–99)
TRIGLYCERIDES: 143 mg/dL (ref 0–149)
VLDL CHOLESTEROL CAL: 29 mg/dL (ref 5–40)

## 2015-09-01 ENCOUNTER — Other Ambulatory Visit: Payer: Self-pay | Admitting: Nurse Practitioner

## 2015-09-10 ENCOUNTER — Other Ambulatory Visit: Payer: Self-pay | Admitting: Nurse Practitioner

## 2015-09-10 DIAGNOSIS — E785 Hyperlipidemia, unspecified: Secondary | ICD-10-CM

## 2015-10-27 ENCOUNTER — Telehealth: Payer: Self-pay | Admitting: Nurse Practitioner

## 2015-10-27 NOTE — Telephone Encounter (Signed)
What kind of walker does she want- regular or the type that you sit on.

## 2015-10-28 NOTE — Telephone Encounter (Signed)
Patient would like a light weight walker with a seat and breaks. Please advise. Patient aware it will be tomorrow before she hears back from [place].

## 2015-10-29 ENCOUNTER — Other Ambulatory Visit: Payer: Self-pay | Admitting: Nurse Practitioner

## 2015-10-29 DIAGNOSIS — G609 Hereditary and idiopathic neuropathy, unspecified: Secondary | ICD-10-CM

## 2015-10-29 MED ORDER — HUGO ROLLING WALKER ELITE MISC: 0 refills | Status: DC

## 2015-10-29 NOTE — Telephone Encounter (Signed)
Patient aware.

## 2015-10-29 NOTE — Telephone Encounter (Signed)
rx for walker ready for pick up  

## 2015-11-02 ENCOUNTER — Ambulatory Visit (INDEPENDENT_AMBULATORY_CARE_PROVIDER_SITE_OTHER): Payer: PPO

## 2015-11-02 DIAGNOSIS — Z23 Encounter for immunization: Secondary | ICD-10-CM

## 2015-11-06 ENCOUNTER — Telehealth: Payer: Self-pay | Admitting: Nurse Practitioner

## 2015-12-28 ENCOUNTER — Ambulatory Visit (INDEPENDENT_AMBULATORY_CARE_PROVIDER_SITE_OTHER): Payer: PPO | Admitting: Nurse Practitioner

## 2015-12-28 ENCOUNTER — Encounter: Payer: Self-pay | Admitting: Nurse Practitioner

## 2015-12-28 VITALS — BP 122/88 | HR 86 | Temp 97.6°F | Ht 66.0 in | Wt 179.0 lb

## 2015-12-28 DIAGNOSIS — E785 Hyperlipidemia, unspecified: Secondary | ICD-10-CM | POA: Diagnosis not present

## 2015-12-28 DIAGNOSIS — M8580 Other specified disorders of bone density and structure, unspecified site: Secondary | ICD-10-CM | POA: Diagnosis not present

## 2015-12-28 DIAGNOSIS — F5101 Primary insomnia: Secondary | ICD-10-CM

## 2015-12-28 DIAGNOSIS — Z6828 Body mass index (BMI) 28.0-28.9, adult: Secondary | ICD-10-CM

## 2015-12-28 DIAGNOSIS — G609 Hereditary and idiopathic neuropathy, unspecified: Secondary | ICD-10-CM | POA: Diagnosis not present

## 2015-12-28 MED ORDER — ATORVASTATIN CALCIUM 40 MG PO TABS: 40.0000 mg | ORAL_TABLET | Freq: Every day | ORAL | 5 refills | Status: DC

## 2015-12-28 MED ORDER — OMEGA-3-ACID ETHYL ESTERS 1 G PO CAPS: 2.0000 | ORAL_CAPSULE | Freq: Two times a day (BID) | ORAL | 3 refills | Status: DC

## 2015-12-28 NOTE — Progress Notes (Signed)
Subjective:    Patient ID: Wendy Robles, female    DOB: 1929-07-08, 82 y.o.   MRN: [id number]  Patient here today for follow up of chronic medical problems. No chnages since last visit. No complaints today.  Outpatient Encounter Prescriptions as of 06/25/2015  Medication Sig  . aspirin 81 MG tablet Take 81 mg by mouth daily.    Marland Kitchen atorvastatin (LIPITOR) 40 MG tablet Take 1 tablet (40 mg total) by mouth daily.  . Calcium Carbonate-Vitamin D (CALTRATE 600+D) 600-400 MG-UNIT per chew tablet Chew 1 tablet by mouth daily.    . Cholecalciferol (VITAMIN D) 2000 UNITS CAPS Take by mouth 2 (two) times daily.    . Multiple Vitamin (MULTIVITAMIN) capsule Take 1 capsule by mouth daily.    . polyethylene glycol (MIRALAX / GLYCOLAX) packet Take 17 g by mouth as needed.    . zolpidem (AMBIEN) 5 MG tablet TAKE ONE TABLET BY MOUTH AT BEDTIME    Hyperlipidemia  This is a new problem. The current episode started more than 1 year ago. The problem is controlled. Recent lipid tests were reviewed and are normal. She has no history of diabetes, hypothyroidism or obesity. Pertinent negatives include no chest pain, myalgias or shortness of breath. Current antihyperlipidemic treatment includes statins. The current treatment provides no improvement of lipids. Compliance problems include adherence to diet and adherence to exercise.  Risk factors for coronary artery disease include dyslipidemia and family history.  Insomnia Ambien 5 mg - states this is no longer working for her. Has difficulty initiating sleep and if she does go to sleep, she will wake up after several hours. Has been trying to limit pills to 3x a week but is now waking up due to pain. AMbien works well when she is not hurting Neuropathy bil lower ext Feet and legs just burn and hurt at night. She has tried neurotin but could not tolerate it. But pain is getting unbearable. Osteopenia Takes vitamin d daily- no weight bearing exercise because she is  unsteady on her feet and walks with a walker.   Review of Systems  Constitutional: Negative.  Negative for fatigue and unexpected weight change.  HENT: Negative.   Eyes: Negative.   Respiratory: Negative.  Negative for shortness of breath.   Cardiovascular: Negative.  Negative for chest pain and palpitations.  Gastrointestinal: Negative.  Negative for constipation and diarrhea.  Endocrine: Negative.   Genitourinary: Negative.   Musculoskeletal: Negative for myalgias.          Skin: Negative.   Allergic/Immunologic: Negative.   Neurological: Negative.   Hematological: Negative.   Psychiatric/Behavioral: Positive for sleep disturbance.  All other systems reviewed and are negative.      Objective:   Physical Exam  Constitutional: She is oriented to person, place, and time. She appears well-developed and well-nourished.  HENT:  Nose: Nose normal.  Mouth/Throat: Oropharynx is clear and moist.  Eyes: EOM are normal.  Neck: Trachea normal, normal range of motion and full passive range of motion without pain. Neck supple. No JVD present. Carotid bruit is not present. No thyromegaly present.  Cardiovascular: Normal rate, regular rhythm, normal heart sounds and intact distal pulses.  Exam reveals no gallop and no friction rub.   No murmur heard. Pulmonary/Chest: Effort normal and breath sounds normal.  Abdominal: Soft. Bowel sounds are normal. She exhibits no distension and no mass. There is no tenderness.  Musculoskeletal: Normal range of motion.  Ambulate with a walker.   Lymphadenopathy:  She has no cervical adenopathy.  Neurological: She is alert and oriented to person, place, and time. She has normal reflexes.  Skin: Skin is warm and dry.  Psychiatric: She has a normal mood and affect. Her behavior is normal. Judgment and thought content normal.    BP 122/88 (BP Location: Left Arm, Cuff Size: Normal)   Pulse 86   Temp 97.6 F (36.4 C) (Oral)   Ht 5' 6" (1.676 m)   Wt  179 lb (81.2 kg)   BMI 28.89 kg/m         Assessment & Plan:   1. Hereditary and idiopathic peripheral neuropathy Do not go barefooted  2. Osteopenia, unspecified location Weight bearing exercises when can tolerate  3. BMI 28.0-28.9,adult Discussed diet and exercise for person with BMI >25 Will recheck weight in 3-6 months   4. Hyperlipidemia with target LDL less than 100 - atorvastatin (LIPITOR) 40 MG tablet; Take 1 tablet (40 mg total) by mouth daily.  Dispense: 30 tablet; Refill: 5 - CMP14+EGFR - Lipid panel  5. Primary insomnia Bedtime routine    Labs pending Health maintenance reviewed Diet and exercise encouraged Continue all meds Follow up  In 3 months   [place], FNP

## 2015-12-28 NOTE — Addendum Note (Signed)
Addended by: Chevis Pretty on: 12/28/2015 11:02 AM   Modules accepted: Orders

## 2015-12-28 NOTE — Patient Instructions (Signed)
Fall Prevention in the Home Introduction Falls can cause injuries. They can happen to people of all ages. There are many things you can do to make your home safe and to help prevent falls. What can I do on the outside of my home?  Regularly fix the edges of walkways and driveways and fix any cracks.  Remove anything that might make you trip as you walk through a door, such as a raised step or threshold.  Trim any bushes or trees on the path to your home.  Use bright outdoor lighting.  Clear any walking paths of anything that might make someone trip, such as rocks or tools.  Regularly check to see if handrails are loose or broken. Make sure that both sides of any steps have handrails.  Any raised decks and porches should have guardrails on the edges.  Have any leaves, snow, or ice cleared regularly.  Use sand or salt on walking paths during winter.  Clean up any spills in your garage right away. This includes oil or grease spills. What can I do in the bathroom?  Use night lights.  Install grab bars by the toilet and in the tub and shower. Do not use towel bars as grab bars.  Use non-skid mats or decals in the tub or shower.  If you need to sit down in the shower, use a plastic, non-slip stool.  Keep the floor dry. Clean up any water that spills on the floor as soon as it happens.  Remove soap buildup in the tub or shower regularly.  Attach bath mats securely with double-sided non-slip rug tape.  Do not have throw rugs and other things on the floor that can make you trip. What can I do in the bedroom?  Use night lights.  Make sure that you have a light by your bed that is easy to reach.  Do not use any sheets or blankets that are too big for your bed. They should not hang down onto the floor.  Have a firm chair that has side arms. You can use this for support while you get dressed.  Do not have throw rugs and other things on the floor that can make you trip. What can  I do in the kitchen?  Clean up any spills right away.  Avoid walking on wet floors.  Keep items that you use a lot in easy-to-reach places.  If you need to reach something above you, use a strong step stool that has a grab bar.  Keep electrical cords out of the way.  Do not use floor polish or wax that makes floors slippery. If you must use wax, use non-skid floor wax.  Do not have throw rugs and other things on the floor that can make you trip. What can I do with my stairs?  Do not leave any items on the stairs.  Make sure that there are handrails on both sides of the stairs and use them. Fix handrails that are broken or loose. Make sure that handrails are as long as the stairways.  Check any carpeting to make sure that it is firmly attached to the stairs. Fix any carpet that is loose or worn.  Avoid having throw rugs at the top or bottom of the stairs. If you do have throw rugs, attach them to the floor with carpet tape.  Make sure that you have a light switch at the top of the stairs and the bottom of the stairs. If you   do not have them, ask someone to add them for you. What else can I do to help prevent falls?  Wear shoes that:  Do not have high heels.  Have rubber bottoms.  Are comfortable and fit you well.  Are closed at the toe. Do not wear sandals.  If you use a stepladder:  Make sure that it is fully opened. Do not climb a closed stepladder.  Make sure that both sides of the stepladder are locked into place.  Ask someone to hold it for you, if possible.  Clearly mark and make sure that you can see:  Any grab bars or handrails.  First and last steps.  Where the edge of each step is.  Use tools that help you move around (mobility aids) if they are needed. These include:  Canes.  Walkers.  Scooters.  Crutches.  Turn on the lights when you go into a dark area. Replace any light bulbs as soon as they burn out.  Set up your furniture so you have a  clear path. Avoid moving your furniture around.  If any of your floors are uneven, fix them.  If there are any pets around you, be aware of where they are.  Review your medicines with your doctor. Some medicines can make you feel dizzy. This can increase your chance of falling. Ask your doctor what other things that you can do to help prevent falls. This information is not intended to replace advice given to you by your health care provider. Make sure you discuss any questions you have with your health care provider. Document Released: 11/13/2008 Document Revised: 06/25/2015 Document Reviewed: 02/21/2014  2017 Elsevier  

## 2015-12-29 LAB — LIPID PANEL
CHOLESTEROL TOTAL: 148 mg/dL (ref 100–199)
Chol/HDL Ratio: 3.2 ratio units (ref 0.0–4.4)
HDL: 46 mg/dL (ref >39)
LDL Calculated: 76 mg/dL (ref 0–99)
Triglycerides: 131 mg/dL (ref 0–149)
VLDL CHOLESTEROL CAL: 26 mg/dL (ref 5–40)

## 2015-12-29 LAB — CMP14+EGFR
ALK PHOS: 59 IU/L (ref 39–117)
ALT: 24 IU/L (ref 0–32)
AST: 24 IU/L (ref 0–40)
Albumin/Globulin Ratio: 2 (ref 1.2–2.2)
Albumin: 4.5 g/dL (ref 3.5–4.7)
BILIRUBIN TOTAL: 0.5 mg/dL (ref 0.0–1.2)
BUN/Creatinine Ratio: 29 — ABNORMAL HIGH (ref 12–28)
BUN: 17 mg/dL (ref 8–27)
CHLORIDE: 103 mmol/L (ref 96–106)
CO2: 27 mmol/L (ref 18–29)
Calcium: 10 mg/dL (ref 8.7–10.3)
Creatinine, Ser: 0.59 mg/dL (ref 0.57–1.00)
GFR calc Af Amer: 97 mL/min/{1.73_m2} (ref >59)
GFR calc non Af Amer: 84 mL/min/{1.73_m2} (ref >59)
GLUCOSE: 103 mg/dL — AB (ref 65–99)
Globulin, Total: 2.3 g/dL (ref 1.5–4.5)
POTASSIUM: 4.5 mmol/L (ref 3.5–5.2)
Sodium: 144 mmol/L (ref 134–144)
Total Protein: 6.8 g/dL (ref 6.0–8.5)

## 2016-03-01 DIAGNOSIS — H43813 Vitreous degeneration, bilateral: Secondary | ICD-10-CM | POA: Diagnosis not present

## 2016-03-01 DIAGNOSIS — H04123 Dry eye syndrome of bilateral lacrimal glands: Secondary | ICD-10-CM | POA: Diagnosis not present

## 2016-03-01 DIAGNOSIS — Z9841 Cataract extraction status, right eye: Secondary | ICD-10-CM | POA: Diagnosis not present

## 2016-03-01 DIAGNOSIS — D3130 Benign neoplasm of unspecified choroid: Secondary | ICD-10-CM | POA: Diagnosis not present

## 2016-05-01 ENCOUNTER — Other Ambulatory Visit: Payer: Self-pay | Admitting: Nurse Practitioner

## 2016-05-05 ENCOUNTER — Other Ambulatory Visit: Payer: Self-pay | Admitting: Nurse Practitioner

## 2016-06-22 ENCOUNTER — Other Ambulatory Visit: Payer: Self-pay | Admitting: Nurse Practitioner

## 2016-06-22 DIAGNOSIS — E785 Hyperlipidemia, unspecified: Secondary | ICD-10-CM

## 2016-06-24 ENCOUNTER — Other Ambulatory Visit: Payer: Self-pay | Admitting: Nurse Practitioner

## 2016-06-24 DIAGNOSIS — E785 Hyperlipidemia, unspecified: Secondary | ICD-10-CM

## 2016-06-24 MED ORDER — ATORVASTATIN CALCIUM 40 MG PO TABS: 40.0000 mg | ORAL_TABLET | Freq: Every day | ORAL | 0 refills | Status: DC

## 2016-06-28 ENCOUNTER — Encounter: Payer: Self-pay | Admitting: Nurse Practitioner

## 2016-06-28 ENCOUNTER — Ambulatory Visit (INDEPENDENT_AMBULATORY_CARE_PROVIDER_SITE_OTHER): Payer: PPO | Admitting: Nurse Practitioner

## 2016-06-28 VITALS — BP 156/89 | HR 85 | Temp 97.2°F | Ht 66.0 in | Wt 179.0 lb

## 2016-06-28 DIAGNOSIS — E785 Hyperlipidemia, unspecified: Secondary | ICD-10-CM | POA: Diagnosis not present

## 2016-06-28 DIAGNOSIS — G609 Hereditary and idiopathic neuropathy, unspecified: Secondary | ICD-10-CM | POA: Diagnosis not present

## 2016-06-28 DIAGNOSIS — M8580 Other specified disorders of bone density and structure, unspecified site: Secondary | ICD-10-CM

## 2016-06-28 DIAGNOSIS — F5101 Primary insomnia: Secondary | ICD-10-CM | POA: Diagnosis not present

## 2016-06-28 DIAGNOSIS — Z6829 Body mass index (BMI) 29.0-29.9, adult: Secondary | ICD-10-CM

## 2016-06-28 MED ORDER — ATORVASTATIN CALCIUM 40 MG PO TABS: 40.0000 mg | ORAL_TABLET | Freq: Every day | ORAL | 0 refills | Status: DC

## 2016-06-28 NOTE — Progress Notes (Signed)
Subjective:    Patient ID: Wendy Robles, female    DOB: 1929/02/18, 83 y.o.   MRN: [id number]  HPI  Wendy Robles is here today for follow up of chronic medical problem.  Outpatient Encounter Prescriptions as of 06/28/2016  Medication Sig  . acetaminophen (TYLENOL) 650 MG CR tablet Take 650 mg by mouth every 8 (eight) hours as needed for pain.  Marland Kitchen aspirin 81 MG tablet Take 81 mg by mouth daily.    Marland Kitchen atorvastatin (LIPITOR) 40 MG tablet Take 1 tablet (40 mg total) by mouth daily.  . Calcium Carbonate-Vitamin D (CALTRATE 600+D) 600-400 MG-UNIT per chew tablet Chew 1 tablet by mouth daily.    . Cholecalciferol (VITAMIN D) 2000 UNITS CAPS Take by mouth 2 (two) times daily.    . Misc. Devices (HUGO ROLLING WALKER ELITE) MISC Use daly for walking  . Multiple Vitamin (MULTIVITAMIN) capsule Take 1 capsule by mouth daily.    Marland Kitchen omega-3 acid ethyl esters (LOVAZA) 1 g capsule TAKE TWO CAPSULES BY MOUTH TWICE DAILY  . polyethylene glycol (MIRALAX / GLYCOLAX) packet Take 17 g by mouth as needed.      1. BMI 29.0-29.9,adult  No recent weight gain or weight loss  2. Hyperlipidemia with target LDL less than 100  Tries to watch diet  3. Primary insomnia  Currently not taking anything- just tries to deal with it  4. Osteopenia, unspecified location  No weight bearing exercise  5. Hereditary and idiopathic peripheral neuropathy  Feet stay numb all the time    New complaints: No compliants     Review of Systems  Constitutional: Negative for diaphoresis.  Eyes: Negative for pain.  Respiratory: Negative for shortness of breath.   Cardiovascular: Negative for chest pain, palpitations and leg swelling.  Gastrointestinal: Negative for abdominal pain.  Endocrine: Negative for polydipsia.  Skin: Negative for rash.  Neurological: Negative for dizziness, weakness and headaches.  Hematological: Does not bruise/bleed easily.       Objective:   Physical Exam  Constitutional: She is oriented  to person, place, and time. She appears well-developed and well-nourished.  HENT:  Nose: Nose normal.  Mouth/Throat: Oropharynx is clear and moist.  Eyes: EOM are normal.  Neck: Trachea normal, normal range of motion and full passive range of motion without pain. Neck supple. No JVD present. Carotid bruit is not present. No thyromegaly present.  Cardiovascular: Normal rate, regular rhythm, normal heart sounds and intact distal pulses.  Exam reveals no gallop and no friction rub.   No murmur heard. Pulmonary/Chest: Effort normal and breath sounds normal.  Abdominal: Soft. Bowel sounds are normal. She exhibits no distension and no mass. There is no tenderness.  Musculoskeletal: Normal range of motion.  Lymphadenopathy:    She has no cervical adenopathy.  Neurological: She is alert and oriented to person, place, and time. She has normal reflexes.  Skin: Skin is warm and dry.  Psychiatric: She has a normal mood and affect. Her behavior is normal. Judgment and thought content normal.    BP (!) 156/89   Pulse 85   Temp 97.2 F (36.2 C) (Oral)   Ht '5\' 6"'$  (1.676 m)   Wt 179 lb (81.2 kg)   BMI 28.89 kg/m       Assessment & Plan:  1. BMI 29.0-29.9,adult Discussed diet and exercise for person with BMI >25 Will recheck weight in 3-6 months  2. Hyperlipidemia with target LDL less than 100 Low fta diet - atorvastatin (LIPITOR) 40  MG tablet; Take 1 tablet (40 mg total) by mouth daily.  Dispense: 90 tablet; Refill: 0 - CMP14+EGFR - Lipid panel  3. Primary insomnia Bedtime routine  4. Osteopenia, unspecified location Weight bearing exercises encouraged  5. Hereditary and idiopathic peripheral neuropathy Do not go barefooted    Labs pending Health maintenance reviewed Diet and exercise encouraged Continue all meds Follow up  In 6 months   [place], FNP

## 2016-06-28 NOTE — Patient Instructions (Signed)
Bone Health Bones protect organs, store calcium, and anchor muscles. Good health habits, such as eating nutritious foods and exercising regularly, are important for maintaining healthy bones. They can also help to prevent a condition that causes bones to lose density and become weak and brittle (osteoporosis). Why is bone mass important? Bone mass refers to the amount of bone tissue that you have. The higher your bone mass, the stronger your bones. An important step toward having healthy bones throughout life is to have strong and dense bones during childhood. A young adult who has a high bone mass is more likely to have a high bone mass later in life. Bone mass at its greatest it is called peak bone mass. A large decline in bone mass occurs in older adults. In women, it occurs about the time of menopause. During this time, it is important to practice good health habits, because if more bone is lost than what is replaced, the bones will become less healthy and more likely to break (fracture). If you find that you have a low bone mass, you may be able to prevent osteoporosis or further bone loss by changing your diet and lifestyle. How can I find out if my bone mass is low? Bone mass can be measured with an X-ray test that is called a bone mineral density (BMD) test. This test is recommended for all women who are age 65 or older. It may also be recommended for men who are age 70 or older, or for people who are more likely to develop osteoporosis due to:  Having bones that break easily.  Having a long-term disease that weakens bones, such as kidney disease or rheumatoid arthritis.  Having menopause earlier than normal.  Taking medicine that weakens bones, such as steroids, thyroid hormones, or hormone treatment for breast cancer or prostate cancer.  Smoking.  Drinking three or more alcoholic drinks each day. What are the nutritional recommendations for healthy bones? To have healthy bones, you need  to get enough of the right minerals and vitamins. Most nutrition experts recommend getting these nutrients from the foods that you eat. Nutritional recommendations vary from person to person. Ask your health care provider what is healthy for you. Here are some general guidelines. Calcium Recommendations  Calcium is the most important (essential) mineral for bone health. Most people can get enough calcium from their diet, but supplements may be recommended for people who are at risk for osteoporosis. Good sources of calcium include:  Dairy products, such as low-fat or nonfat milk, cheese, and yogurt.  Dark green leafy vegetables, such as bok choy and broccoli.  Calcium-fortified foods, such as orange juice, cereal, bread, soy beverages, and tofu products.  Nuts, such as almonds. Follow these recommended amounts for daily calcium intake:  Children, age 1?3: 700 mg.  Children, age 4?8: 1,000 mg.  Children, age 9?13: 1,300 mg.  Teens, age 14?18: 1,300 mg.  Adults, age 19?50: 1,000 mg.  Adults, age 51?70:  Men: 1,000 mg.  Women: 1,200 mg.  Adults, age 71 or older: 1,200 mg.  Pregnant and breastfeeding females:  Teens: 1,300 mg.  Adults: 1,000 mg. Vitamin D Recommendations  Vitamin D is the most essential vitamin for bone health. It helps the body to absorb calcium. Sunlight stimulates the skin to make vitamin D, so be sure to get enough sunlight. If you live in a cold climate or you do not get outside often, your health care provider may recommend that you take vitamin D   supplements. Good sources of vitamin D in your diet include:  Egg yolks.  Saltwater fish.  Milk and cereal fortified with vitamin D. Follow these recommended amounts for daily vitamin D intake:  Children and teens, age 1?18: 600 international units.  Adults, age 50 or younger: 400-800 international units.  Adults, age 51 or older: 800-1,000 international units. Other Nutrients  Other nutrients for bone  health include:  Phosphorus. This mineral is found in meat, poultry, dairy foods, nuts, and legumes. The recommended daily intake for adult men and adult women is 700 mg.  Magnesium. This mineral is found in seeds, nuts, dark green vegetables, and legumes. The recommended daily intake for adult men is 400?420 mg. For adult women, it is 310?320 mg.  Vitamin K. This vitamin is found in green leafy vegetables. The recommended daily intake is 120 mg for adult men and 90 mg for adult women. What type of physical activity is best for building and maintaining healthy bones? Weight-bearing and strength-building activities are important for building and maintaining peak bone mass. Weight-bearing activities cause muscles and bones to work against gravity. Strength-building activities increases muscle strength that supports bones. Weight-bearing and muscle-building activities include:  Walking and hiking.  Jogging and running.  Dancing.  Gym exercises.  Lifting weights.  Tennis and racquetball.  Climbing stairs.  Aerobics. Adults should get at least 30 minutes of moderate physical activity on most days. Children should get at least 60 minutes of moderate physical activity on most days. Ask your health care provide what type of exercise is best for you. Where can I find more information? For more information, check out the following websites:  National Osteoporosis Foundation: http://nof.org/learn/basics  National Institutes of Health: http://www.niams.nih.gov/Health_Info/Bone/Bone_Health/bone_health_for_life.asp This information is not intended to replace advice given to you by your health care provider. Make sure you discuss any questions you have with your health care provider. Document Released: 04/09/2003 Document Revised: 08/07/2015 Document Reviewed: 01/22/2014 Elsevier Interactive Patient Education  2017 Elsevier Inc.  

## 2016-06-29 LAB — CMP14+EGFR
A/G RATIO: 1.8 (ref 1.2–2.2)
ALBUMIN: 4.4 g/dL (ref 3.5–4.7)
ALK PHOS: 53 IU/L (ref 39–117)
ALT: 24 IU/L (ref 0–32)
AST: 26 IU/L (ref 0–40)
BILIRUBIN TOTAL: 0.5 mg/dL (ref 0.0–1.2)
BUN / CREAT RATIO: 18 (ref 12–28)
BUN: 13 mg/dL (ref 8–27)
CO2: 25 mmol/L (ref 18–29)
CREATININE: 0.72 mg/dL (ref 0.57–1.00)
Calcium: 9.9 mg/dL (ref 8.7–10.3)
Chloride: 101 mmol/L (ref 96–106)
GFR calc Af Amer: 88 mL/min/{1.73_m2} (ref >59)
GFR calc non Af Amer: 77 mL/min/{1.73_m2} (ref >59)
GLOBULIN, TOTAL: 2.4 g/dL (ref 1.5–4.5)
Glucose: 94 mg/dL (ref 65–99)
POTASSIUM: 4.5 mmol/L (ref 3.5–5.2)
SODIUM: 143 mmol/L (ref 134–144)
Total Protein: 6.8 g/dL (ref 6.0–8.5)

## 2016-06-29 LAB — LIPID PANEL
CHOLESTEROL TOTAL: 135 mg/dL (ref 100–199)
Chol/HDL Ratio: 3.4 ratio (ref 0.0–4.4)
HDL: 40 mg/dL (ref >39)
LDL CALC: 67 mg/dL (ref 0–99)
TRIGLYCERIDES: 142 mg/dL (ref 0–149)
VLDL Cholesterol Cal: 28 mg/dL (ref 5–40)

## 2016-07-05 ENCOUNTER — Other Ambulatory Visit: Payer: Self-pay | Admitting: Nurse Practitioner

## 2016-07-07 ENCOUNTER — Encounter: Payer: Self-pay | Admitting: Nurse Practitioner

## 2016-07-07 ENCOUNTER — Ambulatory Visit (INDEPENDENT_AMBULATORY_CARE_PROVIDER_SITE_OTHER): Payer: PPO | Admitting: Nurse Practitioner

## 2016-07-07 ENCOUNTER — Other Ambulatory Visit: Payer: Self-pay

## 2016-07-07 VITALS — BP 134/83 | HR 83 | Temp 97.7°F | Ht 66.0 in | Wt 179.0 lb

## 2016-07-07 DIAGNOSIS — M7551 Bursitis of right shoulder: Secondary | ICD-10-CM

## 2016-07-07 MED ORDER — OMEGA-3-ACID ETHYL ESTERS 1 G PO CAPS: 2.0000 | ORAL_CAPSULE | Freq: Two times a day (BID) | ORAL | 1 refills | Status: DC

## 2016-07-07 MED ORDER — METHYLPREDNISOLONE ACETATE 40 MG/ML IJ SUSP
40.0000 mg | Freq: Once | INTRAMUSCULAR | Status: AC
  Administered 2016-07-07: 40 mg via INTRA_ARTICULAR

## 2016-07-07 MED ORDER — BUPIVACAINE HCL 0.25 % IJ SOLN
1.0000 mL | Freq: Once | INTRAMUSCULAR | Status: AC
  Administered 2016-07-07: 1 mL via INTRA_ARTICULAR

## 2016-07-07 NOTE — Progress Notes (Signed)
   Subjective:    Patient ID: Wendy Robles, female    DOB: 18-Jan-1930, 83 y.o.   MRN: [id number]  HPI  Patient comes in today c/o of right shoulder pain- has been intermittent for over a month, but occurs daily. Pain currently rated 8/10. Certain ways she moves shoulder increases pain. Pain radiates down upper arm but does not go below elbow. She has had it injected many years ago which helped back then.   Review of Systems  Constitutional: Negative.   Respiratory: Negative.   Cardiovascular: Negative.   Musculoskeletal: Positive for arthralgias (right shoulder).  Neurological: Negative.   Psychiatric/Behavioral: Negative.   All other systems reviewed and are negative.      Objective:   Physical Exam  Constitutional: She is oriented to person, place, and time. She appears well-developed and well-nourished. No distress.  Cardiovascular: Normal rate and regular rhythm.   Pulmonary/Chest: Effort normal.  Musculoskeletal:  FROM of right shoulder with pain on abdutcion and internal rotation Grips equal bil  Neurological: She is alert and oriented to person, place, and time.  Skin: Skin is warm.  Psychiatric: She has a normal mood and affect. Her behavior is normal. Judgment and thought content normal.   BP 134/83   Pulse 83   Temp 97.7 F (36.5 C) (Oral)   Ht 5\' 6"  (1.676 m)   Wt 179 lb (81.2 kg)   BMI 28.89 kg/m   Right shoulder injection under sterile technique       Assessment & Plan:   1. Bursitis of right shoulder    Ice BID motirn otc as needed Follow up prn  Mary-Margaret Hassell Done, FNP

## 2016-07-07 NOTE — Patient Instructions (Signed)
Facet Joint Block, Care After Refer to this sheet in the next few weeks. These instructions provide you with information about caring for yourself after your procedure. Your health care provider may also give you more specific instructions. Your treatment has been planned according to current medical practices, but problems sometimes occur. Call your health care provider if you have any problems or questions after your procedure. What can I expect after the procedure? After the procedure, it is common to have:  Some tenderness over the injection sites for 2 days after the procedure.  A temporary increase in blood sugar if you have diabetes.  Follow these instructions at home:  Keep track of the amount of pain relief you feel and how long it lasts.  Take over-the-counter and prescription medicines only as told by your health care provider. You may need to limit pain medicine within the first 4-6 hours after the procedure.  Remove your bandages (dressings) the morning after the procedure.  For the first 24 hours after the procedure: ? Do not apply heat near or over the injection sites. ? Do not take a bath or soak in water, such as in a pool or lake. ? Do not drive or operate heavy machinery unless approved by your health care provider. ? Avoid activities that require a lot of energy.  If the injection site is tender, try applying ice to the area. To do this: ? Put ice in a plastic bag. ? Place a towel between your skin and the bag. ? Leave the ice on for 20 minutes, 2-3 times a day.  Keep all follow-up visits as told by your health care provider. This is important. Contact a health care provider if:  Fluid is coming from an injection site.  There is significant bleeding or swelling at an injection site.  You have diabetes and your blood sugar is above 180 mg/dL. Get help right away if:  You have a fever.  You have worsening pain or swelling around an injection site.  There  are red streaks around an injection site.  You develop severe pain that is not controlled by your medicines.  You develop a headache, stiff neck, nausea, or vomiting.  Your eyes become very sensitive to light.  You have weakness, paralysis, or tingling in your arms or legs that was not present before the procedure.  You have difficulty urinating or breathing. This information is not intended to replace advice given to you by your health care provider. Make sure you discuss any questions you have with your health care provider. Document Released: 01/04/2012 Document Revised: 06/03/2015 Document Reviewed: 10/13/2014 Elsevier Interactive Patient Education  2018 Elsevier Inc.  

## 2016-08-16 ENCOUNTER — Encounter: Payer: Self-pay | Admitting: *Deleted

## 2016-08-16 ENCOUNTER — Ambulatory Visit (INDEPENDENT_AMBULATORY_CARE_PROVIDER_SITE_OTHER): Payer: PPO | Admitting: *Deleted

## 2016-08-16 VITALS — BP 150/94 | HR 87 | Ht 65.0 in | Wt 182.0 lb

## 2016-08-16 DIAGNOSIS — Z0000 Encounter for general adult medical examination without abnormal findings: Secondary | ICD-10-CM

## 2016-08-16 NOTE — Patient Instructions (Signed)
  Ms. Wojtowicz ,  Thank you for taking time to come for your Medicare Wellness Visit. I appreciate your ongoing commitment to your health goals. Please review the following plan we discussed and let me know if I can assist you in the future.   Keep follow up with Chevis Pretty, [place] on 12/29/16. Do chair exercises daily.  Empty your bladder every 2 hours to help with urgency Move carefully to avoid falls and use walker regularly Keep cell phone on you or consider an emergency alert system   These are the goals we discussed: Goals    . Exercise 150 minutes per week (moderate activity)          Chair exercises daily (see handout)       This is a list of the screening recommended for you and due dates:  Health Maintenance  Topic Date Due  . Tetanus Vaccine  05/15/1950  . Mammogram  08/28/2014  . Flu Shot  08/31/2016  . DEXA scan (bone density measurement)  Completed  . Pneumonia vaccines  Completed

## 2016-08-16 NOTE — Progress Notes (Signed)
Subjective:   Wendy Robles is a 83 y.o. female who presents for an Initial Medicare Annual Wellness Visit. Wendy Robles is widowed and lives alone in an apartment. She still drives. She is retired from [employer]. She enjoys attending church but her decreased mobility makes it difficult to attend as often as she used to. She has a adult daughter who lives 2 hours away. Her son passed away two years ago from a 2 year battle with oral cancer. She was his primary caregiver during his illness and it was very difficult for her. She was tearful talking about it today. Wendy Robles has 3 grandchildren and 4 great grandchildren. She enjoys doing word search puzzles.   Review of Systems    Reports that neuropathy is worse than last year but that other areas of her health are about the same.  Neurological: bilateral lower extremity neuropathy  Urinary: Urge incontinence  Musculoskeletal: knee weakness, right upper arm pain secondary to rotator cuff injury  Cardiac Risk Factors include: advanced age (>67men, >67 women);dyslipidemia;hypertension;sedentary lifestyle   Other systems negative.      Objective:    Today's Vitals   08/16/16 1004  BP: (!) 150/94  Pulse: 87  Weight: 182 lb (82.6 kg)  Height: 5\' 5"  (1.651 m)  PainSc: 6    Body mass index is 30.29 kg/m.   Current Medications (verified) Outpatient Encounter Prescriptions as of 08/16/2016  Medication Sig  . acetaminophen (TYLENOL) 650 MG CR tablet Take 650 mg by mouth every 8 (eight) hours as needed for pain.  Marland Kitchen aspirin 81 MG tablet Take 81 mg by mouth daily.    Marland Kitchen atorvastatin (LIPITOR) 40 MG tablet Take 1 tablet (40 mg total) by mouth daily.  . Calcium Carbonate-Vitamin D (CALTRATE 600+D) 600-400 MG-UNIT per chew tablet Chew 1 tablet by mouth daily.    . Cholecalciferol (VITAMIN D) 2000 UNITS CAPS Take by mouth 2 (two) times daily.    . Multiple Vitamin (MULTIVITAMIN) capsule Take 1 capsule by mouth daily.    Marland Kitchen omega-3 acid ethyl  esters (LOVAZA) 1 g capsule Take 2 capsules (2 g total) by mouth 2 (two) times daily.  . polyethylene glycol (MIRALAX / GLYCOLAX) packet Take 17 g by mouth as needed.     No facility-administered encounter medications on file as of 08/16/2016.     Allergies (verified) Neurontin [gabapentin] and Bactrim [sulfamethoxazole-trimethoprim]   History: Past Medical History:  Diagnosis Date  . Colon polyps   . Diverticulosis   . Dyslipidemia   . HTN (hypertension)    White coat  . Hyperlipidemia   . Osteoporosis    Past Surgical History:  Procedure Laterality Date  . CARPAL TUNNEL RELEASE    . HEMORRHOID SURGERY    . HEMORROIDECTOMY    . TOTAL ABDOMINAL HYSTERECTOMY     Family History  Problem Relation Age of Onset  . Coronary artery disease Father 55  . Neuropathy Father   . Diabetes Father   . Hypertension Father   . Heart attack Father   . Dementia Mother   . Healthy Sister   . COPD Brother   . COPD Brother   . COPD Brother   . Diabetes Sister   . Cancer Son        oral   Social History   Occupational History  .  Retired   Social History Main Topics  . Smoking status: Never Smoker  . Smokeless tobacco: Never Used  . Alcohol use No  .  Drug use: No  . Sexual activity: No    Tobacco Counseling No tobacco use  Activities of Daily Living In your present state of health, do you have any difficulty performing the following activities: 08/16/2016  Hearing? Y  Vision? N  Difficulty concentrating or making decisions? N  Walking or climbing stairs? Y  Dressing or bathing? N  Doing errands, shopping? N  Preparing Food and eating ? N  Using the Toilet? N  In the past six months, have you accidently leaked urine? Y  Do you have problems with loss of bowel control? N  Managing your Medications? N  Managing your Finances? N  Housekeeping or managing your Housekeeping? Y  Some recent data might be hidden  Difficulty hearing when in a crowd Trouble climbing stairs  due to knee weakness and neuropathy. Does not have stairs at home. Starting to have some problems with housework due to neuropathy and weakness.  Immunizations and Health Maintenance Immunization History  Administered Date(s) Administered  . Influenza, High Dose Seasonal PF 11/02/2015  . Influenza,inj,Quad PF,36+ Mos 11/04/2013, 12/08/2014  . Influenza-Unspecified 10/31/2012  . Pneumococcal Conjugate-13 07/24/2013  . Pneumococcal Polysaccharide-23 08/14/2014   Health Maintenance Due  Topic Date Due  . TETANUS/TDAP  05/15/1950  . MAMMOGRAM  08/28/2014  Mammogram not recommended unless patient requests one. Patient isn't interested.   Patient Care Team: Chevis Pretty, FNP as PCP - General (Nurse Practitioner) Venetia Night, OD Optometry  No hospitalizations, ER visits, or surgeries this past year.     Assessment:   This is a routine wellness examination for Wendy Robles.   Hearing/Vision screen No deficits noted during visit. Last eye exam 02/2016.  Dietary issues and exercise activities discussed: Current Exercise Habits: Home exercise routine, Type of exercise: Other - see comments (Moves some while sitting), Time (Minutes): 10, Frequency (Times/Week): 7, Weekly Exercise (Minutes/Week): 70, Intensity: Mild, Exercise limited by: orthopedic condition(s);neurologic condition(s)  Diet: 2 to 3 meals a day. Eats frozen meals and cooks a few times a week.   Goals    . Exercise 150 minutes per week (moderate activity)          Chair exercises daily (see handout)      Depression Screen PHQ 2/9 Scores 08/16/2016 07/07/2016 06/28/2016 12/28/2015 06/25/2015 03/10/2015 12/04/2014  PHQ - 2 Score 0 0 0 0 0 0 0    Fall Risk Fall Risk  08/16/2016 07/07/2016 06/28/2016 12/28/2015 06/25/2015  Falls in the past year? No No No Yes Yes  Number falls in past yr: - - - 2 or more 2 or more  Injury with Fall? - - - No No  Risk Factor Category  - - - - High Fall Risk  Risk for fall due to : - - - - History  of fall(s);Impaired balance/gait;Impaired mobility    Cognitive Function: MMSE - Mini Mental State Exam 08/16/2016  Orientation to time 5  Orientation to Place 3  Registration 3  Attention/ Calculation 2  Recall 1  Language- name 2 objects 2  Language- repeat 0  Language- follow 3 step command 3  Language- read & follow direction 1  Write a sentence 1  Copy design 1  Total score 22  Abnormal exam. Patient did not seem to have any memory or cognitive issues during other aspects of the visit. I will have her PCP f/u on this at her next visit.      Screening Tests Health Maintenance  Topic Date Due  . TETANUS/TDAP  05/15/1950  .  MAMMOGRAM  08/28/2014  . INFLUENZA VACCINE  08/31/2016  . DEXA SCAN  Completed  . PNA vac Low Risk Adult  Completed   Mammogram not necessary    Plan:  Keep follow up with Chevis Pretty, FNP on 12/29/16. Do chair exercises daily. Handout given and explained.  Empty your bladder every 2 hours to help with urgency Move carefully to avoid falls and use walker regularly Keep cell phone on you or consider an emergency alert system Bring a copy of Advance Directives to our office.   I have personally reviewed and noted the following in the patient's chart:   . Medical and social history . Use of alcohol, tobacco or illicit drugs  . Current medications and supplements . Functional ability and status . Nutritional status . Physical activity . Advanced directives . List of other physicians . Hospitalizations, surgeries, and ER visits in previous 12 months . Vitals . Screenings to include cognitive, depression, and falls . Referrals and appointments  In addition, I have reviewed and discussed with patient certain preventive protocols, quality metrics, and best practice recommendations. A written personalized care plan for preventive services as well as general preventive health recommendations were provided to patient.     Chong Sicilian,  RN   08/16/2016   I have reviewed and agree with the above AWV documentation.   Mary-Margaret Hassell Done, FNP

## 2016-09-05 ENCOUNTER — Encounter: Payer: Self-pay | Admitting: Family

## 2016-09-05 ENCOUNTER — Ambulatory Visit (INDEPENDENT_AMBULATORY_CARE_PROVIDER_SITE_OTHER): Payer: PPO | Admitting: Family

## 2016-09-05 VITALS — BP 138/88 | HR 90 | Temp 98.0°F | Ht 65.0 in | Wt 181.2 lb

## 2016-09-05 DIAGNOSIS — J069 Acute upper respiratory infection, unspecified: Secondary | ICD-10-CM | POA: Diagnosis not present

## 2016-09-05 DIAGNOSIS — J301 Allergic rhinitis due to pollen: Secondary | ICD-10-CM | POA: Diagnosis not present

## 2016-09-05 MED ORDER — FLUTICASONE PROPIONATE 50 MCG/ACT NA SUSP: 2.0000 | Freq: Every day | NASAL | 6 refills | Status: DC

## 2016-09-05 MED ORDER — CETIRIZINE HCL 10 MG PO TABS
5.0000 mg | ORAL_TABLET | Freq: Every day | ORAL | 11 refills | Status: DC
Start: 2016-09-05 — End: 2017-09-08

## 2016-09-05 NOTE — Progress Notes (Signed)
   Subjective:    Patient ID: Wendy Robles, female    DOB: October 30, 1929, 83 y.o.   MRN: [id number]  Sore Throat   This is a new problem. The current episode started 1 to 4 weeks ago. The problem has been gradually worsening. There has been no fever. The pain is at a severity of 4/10. The pain is moderate. Associated symptoms include congestion, coughing, a hoarse voice and trouble swallowing. Pertinent negatives include no ear discharge, ear pain, headaches, plugged ear sensation or swollen glands. She has tried acetaminophen and NSAIDs (mucinex) for the symptoms. The treatment provided mild relief.      Review of Systems  HENT: Positive for congestion, hoarse voice and trouble swallowing. Negative for ear discharge and ear pain.   Respiratory: Positive for cough.   Neurological: Negative for headaches.  All other systems reviewed and are negative.      Objective:   Physical Exam  Constitutional: She is oriented to person, place, and time. She appears well-developed and well-nourished. No distress.  HENT:  Head: Normocephalic and atraumatic.  Right Ear: External ear normal.  Left Ear: External ear normal.  Mouth/Throat: Posterior oropharyngeal erythema present.  Eyes: Pupils are equal, round, and reactive to light. No scleral icterus.  Cardiovascular: Normal rate, regular rhythm, normal heart sounds and intact distal pulses.   No murmur heard. Pulmonary/Chest: Effort normal and breath sounds normal. No respiratory distress. She has no wheezes.  Abdominal: Soft. Bowel sounds are normal. She exhibits no distension. There is no tenderness.  Musculoskeletal: Normal range of motion. She exhibits no edema or tenderness.  Neurological: She is alert and oriented to person, place, and time.  Skin: Skin is warm and dry.  Psychiatric: She has a normal mood and affect. Her behavior is normal. Judgment and thought content normal.  Vitals reviewed.    BP 138/88   Pulse 90   Temp 98 F  (36.7 C) (Oral)   Ht 5\' 5"  (1.651 m)   Wt 181 lb 3.2 oz (82.2 kg)   BMI 30.15 kg/m      Assessment & Plan:  1. Upper respiratory tract infection, unspecified type - cetirizine (ZYRTEC) 10 MG tablet; Take 0.5 tablets (5 mg total) by mouth daily.  Dispense: 30 tablet; Refill: 11  2. Allergic rhinitis due to pollen, unspecified seasonality - fluticasone (FLONASE) 50 MCG/ACT nasal spray; Place 2 sprays into both nostrils daily.  Dispense: 16 g; Refill: 6 - cetirizine (ZYRTEC) 10 MG tablet; Take 0.5 tablets (5 mg total) by mouth daily.  Dispense: 30 tablet; Refill: 11   Force fluids Tylenol as needed Humidifier as needed Call office if symptoms do not resolve in next few days RTO prn   Evelina Dun, FNP

## 2016-09-05 NOTE — Patient Instructions (Signed)
Upper Respiratory Infection, Adult Most upper respiratory infections (URIs) are a viral infection of the air passages leading to the lungs. A URI affects the nose, throat, and upper air passages. The most common type of URI is nasopharyngitis and is typically referred to as "the common cold." URIs run their course and usually go away on their own. Most of the time, a URI does not require medical attention, but sometimes a bacterial infection in the upper airways can follow a viral infection. This is called a secondary infection. Sinus and middle ear infections are common types of secondary upper respiratory infections. Bacterial pneumonia can also complicate a URI. A URI can worsen asthma and chronic obstructive pulmonary disease (COPD). Sometimes, these complications can require emergency medical care and may be life threatening. What are the causes? Almost all URIs are caused by viruses. A virus is a type of germ and can spread from one person to another. What increases the risk? You may be at risk for a URI if:  You smoke.  You have chronic heart or lung disease.  You have a weakened defense (immune) system.  You are very young or very old.  You have nasal allergies or asthma.  You work in crowded or poorly ventilated areas.  You work in health care facilities or schools.  What are the signs or symptoms? Symptoms typically develop 2-3 days after you come in contact with a cold virus. Most viral URIs last 7-10 days. However, viral URIs from the influenza virus (flu virus) can last 14-18 days and are typically more severe. Symptoms may include:  Runny or stuffy (congested) nose.  Sneezing.  Cough.  Sore throat.  Headache.  Fatigue.  Fever.  Loss of appetite.  Pain in your forehead, behind your eyes, and over your cheekbones (sinus pain).  Muscle aches.  How is this diagnosed? Your health care provider may diagnose a URI by:  Physical exam.  Tests to check that your  symptoms are not due to another condition such as: ? Strep throat. ? Sinusitis. ? Pneumonia. ? Asthma.  How is this treated? A URI goes away on its own with time. It cannot be cured with medicines, but medicines may be prescribed or recommended to relieve symptoms. Medicines may help:  Reduce your fever.  Reduce your cough.  Relieve nasal congestion.  Follow these instructions at home:  Take medicines only as directed by your health care provider.  Gargle warm saltwater or take cough drops to comfort your throat as directed by your health care provider.  Use a warm mist humidifier or inhale steam from a shower to increase air moisture. This may make it easier to breathe.  Drink enough fluid to keep your urine clear or pale yellow.  Eat soups and other clear broths and maintain good nutrition.  Rest as needed.  Return to work when your temperature has returned to normal or as your health care provider advises. You may need to stay home longer to avoid infecting others. You can also use a face mask and careful hand washing to prevent spread of the virus.  Increase the usage of your inhaler if you have asthma.  Do not use any tobacco products, including cigarettes, chewing tobacco, or electronic cigarettes. If you need help quitting, ask your health care provider. How is this prevented? The best way to protect yourself from getting a cold is to practice good hygiene.  Avoid oral or hand contact with people with cold symptoms.  Wash your   hands often if contact occurs.  There is no clear evidence that vitamin C, vitamin E, echinacea, or exercise reduces the chance of developing a cold. However, it is always recommended to get plenty of rest, exercise, and practice good nutrition. Contact a health care provider if:  You are getting worse rather than better.  Your symptoms are not controlled by medicine.  You have chills.  You have worsening shortness of breath.  You have  brown or red mucus.  You have yellow or brown nasal discharge.  You have pain in your face, especially when you bend forward.  You have a fever.  You have swollen neck glands.  You have pain while swallowing.  You have white areas in the back of your throat. Get help right away if:  You have severe or persistent: ? Headache. ? Ear pain. ? Sinus pain. ? Chest pain.  You have chronic lung disease and any of the following: ? Wheezing. ? Prolonged cough. ? Coughing up blood. ? A change in your usual mucus.  You have a stiff neck.  You have changes in your: ? Vision. ? Hearing. ? Thinking. ? Mood. This information is not intended to replace advice given to you by your health care provider. Make sure you discuss any questions you have with your health care provider. Document Released: 07/13/2000 Document Revised: 09/20/2015 Document Reviewed: 04/24/2013 Elsevier Interactive Patient Education  2017 [employer]  

## 2016-09-26 ENCOUNTER — Telehealth: Payer: Self-pay | Admitting: Nurse Practitioner

## 2016-09-26 NOTE — Telephone Encounter (Signed)
Pt notified to call and schedule appt with [facility] Pt will call back with any problems

## 2016-09-26 NOTE — Telephone Encounter (Signed)
Nothing more we can do at this time either- will ortho see her to order MRI of shoulder?

## 2016-10-20 ENCOUNTER — Ambulatory Visit (INDEPENDENT_AMBULATORY_CARE_PROVIDER_SITE_OTHER): Payer: PPO

## 2016-10-20 ENCOUNTER — Other Ambulatory Visit (INDEPENDENT_AMBULATORY_CARE_PROVIDER_SITE_OTHER): Payer: PPO

## 2016-10-20 ENCOUNTER — Other Ambulatory Visit: Payer: Self-pay | Admitting: Orthopedic Surgery

## 2016-10-20 DIAGNOSIS — M25511 Pain in right shoulder: Secondary | ICD-10-CM | POA: Diagnosis not present

## 2016-10-20 DIAGNOSIS — R52 Pain, unspecified: Secondary | ICD-10-CM

## 2016-11-08 ENCOUNTER — Ambulatory Visit (INDEPENDENT_AMBULATORY_CARE_PROVIDER_SITE_OTHER): Payer: PPO | Admitting: Family Medicine

## 2016-11-08 DIAGNOSIS — Z23 Encounter for immunization: Secondary | ICD-10-CM | POA: Diagnosis not present

## 2016-11-08 NOTE — Progress Notes (Signed)
HD FLUZONE GIVEN PT TOLERATED WELL

## 2016-12-16 ENCOUNTER — Ambulatory Visit: Payer: PPO | Admitting: Nurse Practitioner

## 2016-12-16 ENCOUNTER — Other Ambulatory Visit: Payer: Self-pay | Admitting: Nurse Practitioner

## 2016-12-16 ENCOUNTER — Encounter: Payer: Self-pay | Admitting: Nurse Practitioner

## 2016-12-16 VITALS — BP 150/93 | Ht 65.0 in

## 2016-12-16 DIAGNOSIS — N342 Other urethritis: Secondary | ICD-10-CM

## 2016-12-16 DIAGNOSIS — R300 Dysuria: Secondary | ICD-10-CM | POA: Diagnosis not present

## 2016-12-16 LAB — MICROSCOPIC EXAMINATION

## 2016-12-16 LAB — URINALYSIS, COMPLETE
Bilirubin, UA: NEGATIVE
Glucose, UA: NEGATIVE
Ketones, UA: NEGATIVE
NITRITE UA: NEGATIVE
PH UA: 7.5 (ref 5.0–7.5)
Protein, UA: NEGATIVE
Specific Gravity, UA: 1.015 (ref 1.005–1.030)
UUROB: 0.2 mg/dL (ref 0.2–1.0)

## 2016-12-16 MED ORDER — CIPROFLOXACIN HCL 500 MG PO TABS: 500.0000 mg | ORAL_TABLET | Freq: Two times a day (BID) | ORAL | 0 refills | Status: DC

## 2016-12-16 NOTE — Progress Notes (Signed)
   Subjective:    Patient ID: Wendy Robles, female    DOB: 1929-11-25, 83 y.o.   MRN: [id number]  HPI Patient comes in today dysuria that started about 1 week ago. deneis discharge. Lots of burning and itching of perineal area at night. Urinary frequecy with incontinence.    Review of Systems  Constitutional: Negative.   HENT: Negative.   Respiratory: Negative.   Cardiovascular: Negative.   Gastrointestinal: Negative.   Genitourinary: Positive for dysuria, frequency, pelvic pain and urgency.  Neurological: Negative.   Psychiatric/Behavioral: Negative.   All other systems reviewed and are negative.      Objective:   Physical Exam  Constitutional: She is oriented to person, place, and time. She appears well-developed and well-nourished. No distress.  Cardiovascular: Normal rate.  Pulmonary/Chest: Effort normal.  Abdominal: Soft. There is tenderness (mild suprapubic pain on palpation).  Genitourinary:  Genitourinary Comments: CVA tenderness  Neurological: She is alert and oriented to person, place, and time.  Skin: Skin is warm.  Psychiatric: She has a normal mood and affect. Her behavior is normal. Judgment and thought content normal.   BP (!) 150/93   Ht 5\' 5"  (1.651 m)   BMI 30.15 kg/m   UA- leuks +       Assessment & Plan:   1. Dysuria   2. Urethritis    Meds ordered this encounter  Medications  . ciprofloxacin (CIPRO) 500 MG tablet    Sig: Take 1 tablet (500 mg total) 2 (two) times daily by mouth.    Dispense:  6 tablet    Refill:  0    Order Specific Question:   Supervising Provider    Answer:   Evette Doffing, CAROL L [[id number]]   Take medication as prescribe Cotton underwear Take shower not bath Cranberry juice, yogurt Force fluids AZO over the counter X2 days Culture pending RTO prn  Wendy Hassell Done, FNP

## 2016-12-16 NOTE — Patient Instructions (Signed)
Take medication as prescribe Cotton underwear Take shower not bath Cranberry juice, yogurt Force fluids AZO over the counter X2 days Culture pending RTO prn  

## 2016-12-18 LAB — URINE CULTURE

## 2016-12-29 ENCOUNTER — Ambulatory Visit: Payer: PPO | Admitting: Nurse Practitioner

## 2016-12-29 ENCOUNTER — Encounter: Payer: Self-pay | Admitting: Nurse Practitioner

## 2016-12-29 VITALS — BP 142/70 | HR 85 | Temp 96.8°F | Ht 65.0 in | Wt 188.0 lb

## 2016-12-29 DIAGNOSIS — M8580 Other specified disorders of bone density and structure, unspecified site: Secondary | ICD-10-CM | POA: Diagnosis not present

## 2016-12-29 DIAGNOSIS — Z6829 Body mass index (BMI) 29.0-29.9, adult: Secondary | ICD-10-CM

## 2016-12-29 DIAGNOSIS — F5101 Primary insomnia: Secondary | ICD-10-CM

## 2016-12-29 DIAGNOSIS — G609 Hereditary and idiopathic neuropathy, unspecified: Secondary | ICD-10-CM | POA: Diagnosis not present

## 2016-12-29 DIAGNOSIS — E785 Hyperlipidemia, unspecified: Secondary | ICD-10-CM | POA: Diagnosis not present

## 2016-12-29 MED ORDER — OMEGA-3-ACID ETHYL ESTERS 1 G PO CAPS: 2.0000 | ORAL_CAPSULE | Freq: Two times a day (BID) | ORAL | 1 refills | Status: DC

## 2016-12-29 NOTE — Patient Instructions (Signed)

## 2016-12-29 NOTE — Progress Notes (Signed)
Subjective:    Patient ID: Wendy Robles, female    DOB: 06-09-1929, 83 y.o.   MRN: [id number]  HPI   Wendy Robles is here today for follow up of chronic medical problem.  Outpatient Encounter Medications as of 12/29/2016  Medication Sig  . acetaminophen (TYLENOL) 650 MG CR tablet Take 650 mg by mouth every 8 (eight) hours as needed for pain.  Marland Kitchen aspirin 81 MG tablet Take 81 mg by mouth daily.    . cetirizine (ZYRTEC) 10 MG tablet Take 0.5 tablets (5 mg total) by mouth daily.  . Cholecalciferol (VITAMIN D) 2000 units CAPS Take 1 capsule 2 (two) times daily by mouth.   . fluticasone (FLONASE) 50 MCG/ACT nasal spray Place 2 sprays into both nostrils daily.  . Multiple Vitamin (MULTIVITAMIN) capsule Take 1 capsule by mouth daily.    Marland Kitchen omega-3 acid ethyl esters (LOVAZA) 1 g capsule Take 2 capsules (2 g total) by mouth 2 (two) times daily.  . polyethylene glycol (MIRALAX / GLYCOLAX) packet Take 17 g by mouth as needed.    Marland Kitchen atorvastatin (LIPITOR) 40 MG tablet Take 1 tablet (40 mg total) by mouth daily.   1. Hereditary and idiopathic peripheral neuropathy  Doing well- burning in ankles- is intermittent  2. Osteopenia, unspecified location  Has constant back pain. Tylenol always helps.  3. Primary insomnia  Was on ambien but sh estopped taking because she was afraid she would wake up and fall. Says she sleeps ok  4. Hyperlipidemia with target LDL less than 100  Not watching diet  5. BMI 29.0-29.9,adult  No recent weight changes   * blood pressure at home is usually 120-130/60's  New complaints: none today  Social history: Lives alone- no family near by.   Review of Systems  Constitutional: Negative for activity change and appetite change.  HENT: Negative.   Eyes: Negative for pain.  Respiratory: Negative for shortness of breath.   Cardiovascular: Negative for chest pain, palpitations and leg swelling.  Gastrointestinal: Negative for abdominal pain.  Endocrine: Negative  for polydipsia.  Genitourinary: Negative.   Skin: Negative for rash.  Neurological: Positive for weakness (bil lower ext). Negative for dizziness and headaches.  Hematological: Does not bruise/bleed easily.  Psychiatric/Behavioral: Negative.   All other systems reviewed and are negative.      Objective:   Physical Exam  Constitutional: She is oriented to person, place, and time. She appears well-developed and well-nourished.  HENT:  Nose: Nose normal.  Mouth/Throat: Oropharynx is clear and moist.  Eyes: EOM are normal.  Neck: Trachea normal, normal range of motion and full passive range of motion without pain. Neck supple. No JVD present. Carotid bruit is not present. No thyromegaly present.  Cardiovascular: Normal rate, regular rhythm, normal heart sounds and intact distal pulses. Exam reveals no gallop and no friction rub.  No murmur heard. Pulmonary/Chest: Effort normal and breath sounds normal.  Abdominal: Soft. Bowel sounds are normal. She exhibits no distension and no mass. There is no tenderness.  Musculoskeletal: Normal range of motion.  Walking with walker  Lymphadenopathy:    She has no cervical adenopathy.  Neurological: She is alert and oriented to person, place, and time. She has normal reflexes.  Skin: Skin is warm and dry.  Psychiatric: She has a normal mood and affect. Her behavior is normal. Judgment and thought content normal.   BP (!) 142/70   Pulse 85   Temp (!) 96.8 F (36 C) (Oral)   Ht  $'5\' 5"'m$  (1.651 m)   Wt 188 lb (85.3 kg)   BMI 31.28 kg/m       Assessment & Plan:  1. Hereditary and idiopathic peripheral neuropathy Patient does not want any medication  2. Osteopenia, unspecified location Weight bearing exercise as can tolerate  3. Primary insomnia Bedtime routine  4. Hyperlipidemia with target LDL less than 100 Low fat diet - CMP14+EGFR - Lipid panel  5. BMI 29.0-29.9,adult Discussed diet and exercise for person with BMI >25 Will  recheck weight in 3-6 months    Labs pending Health maintenance reviewed Diet and exercise encouraged Continue all meds Follow up  In 6 months   Power, FNP

## 2016-12-30 LAB — CMP14+EGFR
A/G RATIO: 1.9 (ref 1.2–2.2)
ALBUMIN: 4.4 g/dL (ref 3.5–4.7)
ALT: 30 IU/L (ref 0–32)
AST: 29 IU/L (ref 0–40)
Alkaline Phosphatase: 53 IU/L (ref 39–117)
BILIRUBIN TOTAL: 0.4 mg/dL (ref 0.0–1.2)
BUN / CREAT RATIO: 23 (ref 12–28)
BUN: 17 mg/dL (ref 8–27)
CALCIUM: 10.1 mg/dL (ref 8.7–10.3)
CHLORIDE: 101 mmol/L (ref 96–106)
CO2: 26 mmol/L (ref 20–29)
Creatinine, Ser: 0.74 mg/dL (ref 0.57–1.00)
GFR, EST AFRICAN AMERICAN: 85 mL/min/{1.73_m2} (ref >59)
GFR, EST NON AFRICAN AMERICAN: 74 mL/min/{1.73_m2} (ref >59)
Globulin, Total: 2.3 g/dL (ref 1.5–4.5)
Glucose: 101 mg/dL — ABNORMAL HIGH (ref 65–99)
POTASSIUM: 4.1 mmol/L (ref 3.5–5.2)
Sodium: 142 mmol/L (ref 134–144)
TOTAL PROTEIN: 6.7 g/dL (ref 6.0–8.5)

## 2016-12-30 LAB — LIPID PANEL
CHOL/HDL RATIO: 5.8 ratio — AB (ref 0.0–4.4)
Cholesterol, Total: 221 mg/dL — ABNORMAL HIGH (ref 100–199)
HDL: 38 mg/dL — ABNORMAL LOW (ref >39)
LDL Calculated: 148 mg/dL — ABNORMAL HIGH (ref 0–99)
Triglycerides: 176 mg/dL — ABNORMAL HIGH (ref 0–149)
VLDL CHOLESTEROL CAL: 35 mg/dL (ref 5–40)

## 2017-03-09 DIAGNOSIS — Z9842 Cataract extraction status, left eye: Secondary | ICD-10-CM | POA: Diagnosis not present

## 2017-03-09 DIAGNOSIS — H04123 Dry eye syndrome of bilateral lacrimal glands: Secondary | ICD-10-CM | POA: Diagnosis not present

## 2017-03-09 DIAGNOSIS — Z961 Presence of intraocular lens: Secondary | ICD-10-CM | POA: Diagnosis not present

## 2017-03-09 DIAGNOSIS — D3130 Benign neoplasm of unspecified choroid: Secondary | ICD-10-CM | POA: Diagnosis not present

## 2017-03-09 DIAGNOSIS — Z9841 Cataract extraction status, right eye: Secondary | ICD-10-CM | POA: Diagnosis not present

## 2017-03-22 ENCOUNTER — Other Ambulatory Visit: Payer: Self-pay | Admitting: Nurse Practitioner

## 2017-03-22 DIAGNOSIS — E785 Hyperlipidemia, unspecified: Secondary | ICD-10-CM

## 2017-06-29 ENCOUNTER — Ambulatory Visit (INDEPENDENT_AMBULATORY_CARE_PROVIDER_SITE_OTHER): Payer: PPO | Admitting: Nurse Practitioner

## 2017-06-29 ENCOUNTER — Encounter: Payer: Self-pay | Admitting: Nurse Practitioner

## 2017-06-29 VITALS — BP 136/82 | HR 82 | Temp 97.8°F | Ht 65.0 in | Wt 183.0 lb

## 2017-06-29 DIAGNOSIS — Z6829 Body mass index (BMI) 29.0-29.9, adult: Secondary | ICD-10-CM | POA: Diagnosis not present

## 2017-06-29 DIAGNOSIS — M8580 Other specified disorders of bone density and structure, unspecified site: Secondary | ICD-10-CM

## 2017-06-29 DIAGNOSIS — F5101 Primary insomnia: Secondary | ICD-10-CM | POA: Diagnosis not present

## 2017-06-29 DIAGNOSIS — E785 Hyperlipidemia, unspecified: Secondary | ICD-10-CM | POA: Diagnosis not present

## 2017-06-29 DIAGNOSIS — R2681 Unsteadiness on feet: Secondary | ICD-10-CM

## 2017-06-29 DIAGNOSIS — G609 Hereditary and idiopathic neuropathy, unspecified: Secondary | ICD-10-CM

## 2017-06-29 LAB — LIPID PANEL
CHOLESTEROL TOTAL: 132 mg/dL (ref 100–199)
Chol/HDL Ratio: 3.1 ratio (ref 0.0–4.4)
HDL: 42 mg/dL (ref >39)
LDL CALC: 63 mg/dL (ref 0–99)
Triglycerides: 133 mg/dL (ref 0–149)
VLDL Cholesterol Cal: 27 mg/dL (ref 5–40)

## 2017-06-29 LAB — CMP14+EGFR
ALBUMIN: 4.5 g/dL (ref 3.5–4.7)
ALK PHOS: 54 IU/L (ref 39–117)
ALT: 20 IU/L (ref 0–32)
AST: 24 IU/L (ref 0–40)
Albumin/Globulin Ratio: 1.9 (ref 1.2–2.2)
BILIRUBIN TOTAL: 0.5 mg/dL (ref 0.0–1.2)
BUN / CREAT RATIO: 19 (ref 12–28)
BUN: 13 mg/dL (ref 8–27)
CHLORIDE: 104 mmol/L (ref 96–106)
CO2: 26 mmol/L (ref 20–29)
CREATININE: 0.67 mg/dL (ref 0.57–1.00)
Calcium: 10.1 mg/dL (ref 8.7–10.3)
GFR calc non Af Amer: 80 mL/min/{1.73_m2} (ref >59)
GFR, EST AFRICAN AMERICAN: 92 mL/min/{1.73_m2} (ref >59)
Globulin, Total: 2.4 g/dL (ref 1.5–4.5)
Glucose: 102 mg/dL — ABNORMAL HIGH (ref 65–99)
Potassium: 4.5 mmol/L (ref 3.5–5.2)
Sodium: 145 mmol/L — ABNORMAL HIGH (ref 134–144)
TOTAL PROTEIN: 6.9 g/dL (ref 6.0–8.5)

## 2017-06-29 MED ORDER — OMEGA-3-ACID ETHYL ESTERS 1 G PO CAPS: 2.0000 | ORAL_CAPSULE | Freq: Two times a day (BID) | ORAL | 1 refills | Status: DC

## 2017-06-29 NOTE — Progress Notes (Signed)
Subjective:    Patient ID: Wendy Robles, female    DOB: 08/30/1929, 84 y.o.   MRN: [id number]   Chief Complaint: Medical Management of Chronic Issues   HPI:  1. Primary insomnia  Only has 1-2 nights a week that she only sleeps about 4 hours. Does well other nights  2. Hyperlipidemia with target LDL less than 100  Tries to avoid fried foods. Has been dieting for the last months and has lost 5lbs. Sh ehas cut he rlipitor back to 78mdaly. Would like to change to crestor if labs still look bad.  3. BMI 29.0-29.9,adult  Has lost 5 lbs  4. Hereditary and idiopathic peripheral neuropathy  Feet hurt and burn daily. Sh eis not taking anything right now other then an OTC topical cream taht helps.  5. Osteopenia, unspecified location  Last dexascan ws done 12/16/115. Patient does not want to do today. She does very little exercise.    Outpatient Encounter Medications as of 06/29/2017  Medication Sig  . acetaminophen (TYLENOL) 650 MG CR tablet Take 650 mg by mouth every 8 (eight) hours as needed for pain.  .Marland Kitchenaspirin 81 MG tablet Take 81 mg by mouth daily.    .Marland Kitchenatorvastatin (LIPITOR) 40 MG tablet Take 1 tablet (40 mg total) by mouth daily. (Patient taking differently: Take 40 mg by mouth daily. Taking 1/2 tab daily)  . cetirizine (ZYRTEC) 10 MG tablet Take 0.5 tablets (5 mg total) by mouth daily.  . Cholecalciferol (VITAMIN D) 2000 units CAPS Take 1 capsule 2 (two) times daily by mouth.   . fluticasone (FLONASE) 50 MCG/ACT nasal spray Place 2 sprays into both nostrils daily.  . Multiple Vitamin (MULTIVITAMIN) capsule Take 1 capsule by mouth daily.    .Marland Kitchenomega-3 acid ethyl esters (LOVAZA) 1 g capsule Take 2 capsules (2 g total) by mouth 2 (two) times daily.  . polyethylene glycol (MIRALAX / GLYCOLAX) packet Take 17 g by mouth as needed.         New complaints: None today  Social history: Lives by herself- daughter lives out of town a dn she doe snot get to see very often   Review  of Systems  Constitutional: Negative for activity change and appetite change.  HENT: Negative.   Eyes: Negative for pain.  Respiratory: Negative for shortness of breath.   Cardiovascular: Negative for chest pain, palpitations and leg swelling.  Gastrointestinal: Negative for abdominal pain.  Endocrine: Negative for polydipsia.  Genitourinary: Negative.   Skin: Negative for rash.  Neurological: Negative for dizziness, weakness and headaches.  Hematological: Does not bruise/bleed easily.  Psychiatric/Behavioral: Negative.   All other systems reviewed and are negative.      Objective:   Physical Exam  Constitutional: She is oriented to person, place, and time.  HENT:  Head: Normocephalic.  Nose: Nose normal.  Mouth/Throat: Oropharynx is clear and moist.  Eyes: Pupils are equal, round, and reactive to light. EOM are normal.  Neck: Normal range of motion. Neck supple. No JVD present. Carotid bruit is not present.  Cardiovascular: Normal rate, regular rhythm, normal heart sounds and intact distal pulses.  Pulmonary/Chest: Effort normal and breath sounds normal. No respiratory distress. She has no wheezes. She has no rales. She exhibits no tenderness.  Abdominal: Soft. Normal appearance, normal aorta and bowel sounds are normal. She exhibits no distension, no abdominal bruit, no pulsatile midline mass and no mass. There is no splenomegaly or hepatomegaly. There is no tenderness.  Musculoskeletal: Normal range  of motion. She exhibits no edema.  Uses walker to daily  Lymphadenopathy:    She has no cervical adenopathy.  Neurological: She is alert and oriented to person, place, and time. She has normal reflexes.  Skin: Skin is warm and dry.  Psychiatric: She has a normal mood and affect. Her behavior is normal. Judgment and thought content normal.   BP 136/82 (BP Location: Left Arm, Cuff Size: Normal)   Pulse 82   Temp 97.8 F (36.6 C) (Oral)   Ht '5\' 5"'$  (1.651 m)   Wt 183 lb (83 kg)    BMI 30.45 kg/m        Assessment & Plan:  Wendy Robles comes in today with chief complaint of Medical Management of Chronic Issues   Diagnosis and orders addressed:  1. Primary insomnia Bedtime routine  2. Hyperlipidemia with target LDL less than 100 Low fat diet Patient want sto change to crestor if ldl increase  3. BMI 29.0-29.9,adult Discussed diet and exercise for person with BMI >25 Will recheck weight in 3-6 months  4. Hereditary and idiopathic peripheral neuropathy Do not go barefooted  5. Osteopenia, unspecified location Weight bearing exercise Will do dexascan at next appointment  6. Unsteady gait Fall precautions - For home use only DME 4 wheeled rolling walker with seat ([id number])  Orders Placed This Encounter  Procedures  . For home use only DME 4 wheeled rolling walker with seat ([id number])    Order Specific Question:   Patient needs a walker to treat with the following condition    Answer:   Unsteady gait [[id number]]  . CMP14+EGFR  . Lipid panel    Labs pending Health Maintenance reviewed Diet and exercise encouraged  Follow up plan: 6 months   Mary-Margaret Hassell Done, FNP

## 2017-06-29 NOTE — Patient Instructions (Signed)

## 2017-07-01 MED ORDER — ROSUVASTATIN CALCIUM 20 MG PO TABS: 20.0000 mg | ORAL_TABLET | Freq: Every day | ORAL | 3 refills | Status: DC

## 2017-07-01 NOTE — Addendum Note (Signed)
Addended by: Chevis Pretty on: 07/01/2017 10:36 PM   Modules accepted: Orders

## 2017-07-10 DIAGNOSIS — R69 Illness, unspecified: Secondary | ICD-10-CM | POA: Diagnosis not present

## 2017-07-12 ENCOUNTER — Telehealth: Payer: Self-pay | Admitting: Nurse Practitioner

## 2017-07-12 NOTE — Telephone Encounter (Signed)
Do you know anything about this? 

## 2017-07-13 NOTE — Telephone Encounter (Signed)
What kind of walker does she need

## 2017-07-14 ENCOUNTER — Other Ambulatory Visit: Payer: Self-pay

## 2017-07-14 DIAGNOSIS — R2681 Unsteadiness on feet: Secondary | ICD-10-CM

## 2017-07-14 NOTE — Telephone Encounter (Signed)
Rx for walker given

## 2017-08-15 ENCOUNTER — Ambulatory Visit (INDEPENDENT_AMBULATORY_CARE_PROVIDER_SITE_OTHER): Payer: PPO | Admitting: Family

## 2017-08-15 ENCOUNTER — Encounter: Payer: Self-pay | Admitting: Family

## 2017-08-15 VITALS — BP 162/83 | HR 81 | Temp 97.9°F | Ht 65.0 in | Wt 183.4 lb

## 2017-08-15 DIAGNOSIS — N3001 Acute cystitis with hematuria: Secondary | ICD-10-CM | POA: Diagnosis not present

## 2017-08-15 DIAGNOSIS — R350 Frequency of micturition: Secondary | ICD-10-CM

## 2017-08-15 LAB — URINALYSIS, COMPLETE
Bilirubin, UA: NEGATIVE
Glucose, UA: NEGATIVE
Ketones, UA: NEGATIVE
Nitrite, UA: NEGATIVE
PH UA: 7.5 (ref 5.0–7.5)
Protein, UA: NEGATIVE
Specific Gravity, UA: 1.015 (ref 1.005–1.030)
Urobilinogen, Ur: 0.2 mg/dL (ref 0.2–1.0)

## 2017-08-15 LAB — MICROSCOPIC EXAMINATION: Renal Epithel, UA: NONE SEEN /HPF

## 2017-08-15 MED ORDER — CEPHALEXIN 500 MG PO CAPS: 500.0000 mg | ORAL_CAPSULE | Freq: Two times a day (BID) | ORAL | 0 refills | Status: DC

## 2017-08-15 MED ORDER — FLUCONAZOLE 150 MG PO TABS: 150.0000 mg | ORAL_TABLET | ORAL | 0 refills | Status: DC | PRN

## 2017-08-15 NOTE — Patient Instructions (Signed)

## 2017-08-15 NOTE — Addendum Note (Signed)
Addended by: Evelina Dun A on: 08/15/2017 09:04 AM   Modules accepted: Orders

## 2017-08-15 NOTE — Progress Notes (Signed)
   Subjective:    Patient ID: Wendy Robles, female    DOB: March 29, 1929, 84 y.o.   MRN: [id number]  Chief Complaint  Patient presents with  . urine frequency    Dysuria   This is a new problem. The current episode started in the past 7 days. The problem occurs intermittently. The problem has been waxing and waning. The quality of the pain is described as burning. The pain is at a severity of 4/10. The pain is moderate. Associated symptoms include frequency, hematuria and urgency. Pertinent negatives include no discharge, flank pain, nausea or vomiting. She has tried increased fluids (monistat) for the symptoms. The treatment provided mild relief.      Review of Systems  Gastrointestinal: Negative for nausea and vomiting.  Genitourinary: Positive for dysuria, frequency, hematuria and urgency. Negative for flank pain.  All other systems reviewed and are negative.      Objective:   Physical Exam  Constitutional: She is oriented to person, place, and time. She appears well-developed and well-nourished. No distress.  HENT:  Head: Normocephalic and atraumatic.  Eyes: Pupils are equal, round, and reactive to light.  Neck: Normal range of motion. Neck supple. No thyromegaly present.  Cardiovascular: Normal rate, regular rhythm, normal heart sounds and intact distal pulses.  No murmur heard. Pulmonary/Chest: Effort normal and breath sounds normal. No respiratory distress. She has no wheezes.  Abdominal: Soft. Bowel sounds are normal. She exhibits no distension. There is no tenderness.  Musculoskeletal: Normal range of motion. She exhibits no edema or tenderness.  Neurological: She is alert and oriented to person, place, and time. She has normal reflexes. No cranial nerve deficit.  Skin: Skin is warm and dry.  Psychiatric: She has a normal mood and affect. Her behavior is normal. Judgment and thought content normal.  Vitals reviewed.     BP (!) 162/83   Pulse 81   Temp 97.9 F (36.6  C) (Oral)   Ht 5\' 5"  (1.651 m)   Wt 183 lb 6.4 oz (83.2 kg)   BMI 30.52 kg/m      Assessment & Plan:  Wendy Robles was seen today for urine frequency.  Diagnoses and all orders for this visit:  Acute cystitis with hematuria -     Urine Culture -     cephALEXin (KEFLEX) 500 MG capsule; Take 1 capsule (500 mg total) by mouth 2 (two) times daily.  Urine frequency -     Urinalysis, Complete   Force fluids RTO prn Culture pending   Wendy Dun, FNP

## 2017-08-17 LAB — URINE CULTURE

## 2017-09-08 ENCOUNTER — Other Ambulatory Visit: Payer: Self-pay | Admitting: Family

## 2017-09-08 DIAGNOSIS — J069 Acute upper respiratory infection, unspecified: Secondary | ICD-10-CM

## 2017-09-08 DIAGNOSIS — J301 Allergic rhinitis due to pollen: Secondary | ICD-10-CM

## 2017-09-13 ENCOUNTER — Encounter: Payer: Self-pay | Admitting: *Deleted

## 2017-09-13 ENCOUNTER — Ambulatory Visit (INDEPENDENT_AMBULATORY_CARE_PROVIDER_SITE_OTHER): Payer: PPO | Admitting: *Deleted

## 2017-09-13 VITALS — BP 160/75 | HR 75 | Ht 64.0 in | Wt 185.0 lb

## 2017-09-13 DIAGNOSIS — Z0000 Encounter for general adult medical examination without abnormal findings: Secondary | ICD-10-CM

## 2017-09-13 DIAGNOSIS — J069 Acute upper respiratory infection, unspecified: Secondary | ICD-10-CM

## 2017-09-13 DIAGNOSIS — J301 Allergic rhinitis due to pollen: Secondary | ICD-10-CM

## 2017-09-13 MED ORDER — FLUTICASONE PROPIONATE 50 MCG/ACT NA SUSP: 2.0000 | Freq: Every day | NASAL | 5 refills | Status: DC

## 2017-09-13 MED ORDER — CETIRIZINE HCL 10 MG PO TABS: 5.0000 mg | ORAL_TABLET | Freq: Every day | ORAL | 1 refills | Status: DC

## 2017-09-13 NOTE — Progress Notes (Addendum)
Subjective:   Wendy Robles is a 84 y.o. female who presents for a Medicare Annual Wellness Visit. Wendy Robles lives in an apartment in [place]. Her daughter lives in in [place] and her granddaughter lives in [place]. She is close with her neighbors and her church family. Her son passed away several years ago from cancer.    Review of Systems    Patient reports that her overall health is worse compared to last year.  Cardiac Risk Factors include: advanced age (>72men, >62 women);dyslipidemia;hypertension;obesity (BMI >30kg/m2);sedentary lifestyle  Generalized weakness and lower energy level.   All other systems negative      Current Medications (verified) Outpatient Encounter Medications as of 09/13/2017  Medication Sig  . acetaminophen (TYLENOL) 650 MG CR tablet Take 650 mg by mouth every 8 (eight) hours as needed for pain.  Marland Kitchen aspirin 81 MG tablet Take 81 mg by mouth daily.    . cetirizine (ZYRTEC) 10 MG tablet Take 0.5 tablets (5 mg total) by mouth daily.  . Cholecalciferol (VITAMIN D) 2000 units CAPS Take 1 capsule 2 (two) times daily by mouth.   . fluticasone (FLONASE) 50 MCG/ACT nasal spray Place 2 sprays into both nostrils daily.  . Multiple Vitamin (MULTIVITAMIN) capsule Take 1 capsule by mouth daily.    . NONFORMULARY OR COMPOUNDED ITEM Apply 1 application topically as needed (Relaxing leg cream: Gnaphalium & polycephalum causticum).  Marland Kitchen omega-3 acid ethyl esters (LOVAZA) 1 g capsule Take 2 capsules (2 g total) by mouth 2 (two) times daily.  . polyethylene glycol (MIRALAX / GLYCOLAX) packet Take 17 g by mouth as needed.    . rosuvastatin (CRESTOR) 20 MG tablet Take 1 tablet (20 mg total) by mouth daily.  . [DISCONTINUED] cetirizine (ZYRTEC) 10 MG tablet TAKE 1/2 (ONE-HALF) TABLET BY MOUTH ONCE DAILY  . [DISCONTINUED] fluticasone (FLONASE) 50 MCG/ACT nasal spray USE 2 SPRAYS IN EACH NOSTRIL ONCE DAILY  . cephALEXin (KEFLEX) 500 MG capsule Take 1 capsule (500 mg  total) by mouth 2 (two) times daily.  . fluconazole (DIFLUCAN) 150 MG tablet Take 1 tablet (150 mg total) by mouth every three (3) days as needed.   No facility-administered encounter medications on file as of 09/13/2017.     Allergies (verified) Neurontin [gabapentin] and Bactrim [sulfamethoxazole-trimethoprim]   History: Past Medical History:  Diagnosis Date  . Colon polyps   . Diverticulosis   . Dyslipidemia   . HTN (hypertension)    White coat  . Hyperlipidemia   . Osteoporosis    Past Surgical History:  Procedure Laterality Date  . CARPAL TUNNEL RELEASE    . HEMORRHOID SURGERY    . HEMORROIDECTOMY    . TOTAL ABDOMINAL HYSTERECTOMY     Family History  Problem Relation Age of Onset  . Coronary artery disease Father 55  . Neuropathy Father   . Diabetes Father   . Hypertension Father   . Heart attack Father   . Dementia Mother   . Healthy Sister   . COPD Brother   . COPD Brother   . COPD Brother   . Diabetes Sister   . Cancer Son        oral   Social History   Socioeconomic History  . Marital status: Widowed    Spouse name: Not on file  . Number of children: 2  . Years of education: 7  . Highest education level: 7th grade  Occupational History    Employer: RETIRED  Social Needs  . Financial resource strain:  Not hard at all  . Food insecurity:    Worry: Never true    Inability: Never true  . Transportation needs:    Medical: No    Non-medical: No  Tobacco Use  . Smoking status: Never Smoker  . Smokeless tobacco: Never Used  Substance and Sexual Activity  . Alcohol use: No  . Drug use: No  . Sexual activity: Not Currently  Lifestyle  . Physical activity:    Days per week: 0 days    Minutes per session: 0 min  . Stress: Only a little  Relationships  . Social connections:    Talks on phone: More than three times a week    Gets together: More than three times a week    Attends religious service: More than 4 times per year    Active member of  club or organization: Yes    Attends meetings of clubs or organizations: More than 4 times per year    Relationship status: Widowed  Other Topics Concern  . Not on file  Social History Narrative  . Not on file    Tobacco Use No.  Clinical Intake:     Pain : 0-10 Pain Type: Chronic pain Pain Location: Knee(multiple joint pains) Pain Descriptors / Indicators: Aching Pain Onset: More than a month ago Pain Frequency: Intermittent Pain Relieving Factors: muscle rub Effect of Pain on Daily Activities: minimal  Pain Relieving Factors: muscle rub  Nutritional Status: BMI > 30  Obese Diabetes: No  How often do you need to have someone help you when you read instructions, pamphlets, or other written materials from your doctor or pharmacy?: 1 - Never What is the last grade level you completed in school?: 7th grade     Information entered by :: Chong Sicilian, RN   Activities of Daily Living In your present state of health, do you have any difficulty performing the following activities: 09/13/2017  Hearing? N  Vision? N  Difficulty concentrating or making decisions? N  Walking or climbing stairs? Y  Comment uses a rolling walker  Dressing or bathing? N  Doing errands, shopping? N  Preparing Food and eating ? N  Using the Toilet? N  In the past six months, have you accidently leaked urine? Y  Comment wears pads  Do you have problems with loss of bowel control? N  Managing your Medications? N  Managing your Finances? N  Housekeeping or managing your Housekeeping? N  Comment gets really tired doing housework  Some recent data might be hidden    Diets 3 meals a day and some snacks Increasing water intake  Exercise Current Exercise Habits: The patient does not participate in regular exercise at present, Exercise limited by: orthopedic condition(s);Other - see comments   Depression Screen PHQ 2/9 Scores 09/13/2017 08/15/2017 06/29/2017 12/29/2016 12/16/2016 09/05/2016  08/16/2016  PHQ - 2 Score 0 0 0 0 0 0 0     Fall Risk Fall Risk  09/13/2017 08/15/2017 06/29/2017 12/29/2016 12/16/2016  Falls in the past year? No No No No No  Comment - - - - -  Number falls in past yr: - - - - -  Injury with Fall? - - - - -  Risk Factor Category  - - - - -  Risk for fall due to : - - - - -    Safety Is the patient's home free of loose throw rugs in walkways, pet beds, electrical cords, etc?   yes  Grab bars in the bathroom? yes      Walkin shower? no      Shower Seat? yes      Handrails on the stairs?   yes      Adequate lighting?   yes She has an emergency cord in the bathroom that sets on an alarm when pulled.   Patient Care Team: Chevis Pretty, FNP as PCP - General (Nurse Practitioner) Melina Schools, OD (Optometry)  Hospitalizations, surgeries, and ER visits in previous 12 months No hospitalizations, ER visits, or surgeries this past year.   Objective:    Today's Vitals   09/13/17 1121  BP: (!) 160/75  Pulse: 75  Weight: 185 lb (83.9 kg)  Height: 5\' 4"  (1.626 m)   Body mass index is 31.76 kg/m.  No flowsheet data found.  Hearing/Vision  normal or No deficits noted during visit.  Cognitive Function: MMSE - Mini Mental State Exam 09/13/2017 08/16/2016  Orientation to time 5 5  Orientation to Place 5 3  Registration 3 3  Attention/ Calculation 5 2  Recall 2 1  Language- name 2 objects 2 2  Language- repeat 1 0  Language- follow 3 step command 3 3  Language- read & follow direction 1 1  Write a sentence 1 1  Copy design 0 1  Total score 28 22       Normal Cognitive Function Screening: Yes    Immunizations and Health Maintenance Immunization History  Administered Date(s) Administered  . Influenza, High Dose Seasonal PF 11/02/2015, 11/08/2016  . Influenza,inj,Quad PF,6+ Mos 11/04/2013, 12/08/2014  . Influenza-Unspecified 10/31/2012  . Pneumococcal Conjugate-13 07/24/2013  . Pneumococcal Polysaccharide-23 08/14/2014    Health Maintenance Due  Topic Date Due  . INFLUENZA VACCINE  08/31/2017   Health Maintenance  Topic Date Due  . INFLUENZA VACCINE  08/31/2017  . MAMMOGRAM  12/29/2017 (Originally 08/28/2014)  . TETANUS/TDAP  12/29/2017 (Originally 05/15/1948)  . DEXA SCAN  Completed  . PNA vac Low Risk Adult  Completed        Assessment:   This is a routine wellness examination for Avriel.    Plan:    Goals    . Exercise 150 minutes per week (moderate activity)     Chair exercises daily (see handout)        Health Maintenance Recommendations: Td vaccine   Additional Screening Recommendations: Lung: Low Dose CT Chest recommended if Age 10-80 years, 30 pack-year currently smoking OR have quit w/in 15years. Patient does not qualify. Hepatitis C Screening recommended: no  Today's Orders No orders of the defined types were placed in this encounter.   Keep f/u with Chevis Pretty, FNP and any other specialty appointments you may have Continue current medications Move carefully to avoid falls. Use assistive devices like a can or walker if needed. Aim for at least 150 minutes of moderate activity a week. This can be done with chair exercises if necessary. Read or work on puzzles daily Stay connected with friends and family  I have personally reviewed and noted the following in the patient's chart:   . Medical and social history . Use of alcohol, tobacco or illicit drugs  . Current medications and supplements . Functional ability and status . Nutritional status . Physical activity . Advanced directives . List of other physicians . Hospitalizations, surgeries, and ER visits in previous 12 months . Vitals . Screenings to include cognitive, depression, and falls . Referrals and appointments  In addition, I have reviewed and discussed  with patient certain preventive protocols, quality metrics, and best practice recommendations. A written personalized care plan for preventive  services as well as general preventive health recommendations were provided to patient.     Chong Sicilian, RN   09/14/2017    I have reviewed and agree with the above AWV documentation.   Mary-Margaret Hassell Done, FNP

## 2017-09-13 NOTE — Patient Instructions (Signed)
  Ms. Gubbels , Thank you for taking time to come for your Medicare Wellness Visit. I appreciate your ongoing commitment to your health goals. Please review the following plan we discussed and let me know if I can assist you in the future.   These are the goals we discussed: Goals    . Exercise 150 minutes per week (moderate activity)     Chair exercises daily (see handout)       This is a list of the screening recommended for you and due dates:  Health Maintenance  Topic Date Due  . Flu Shot  08/31/2017  . Mammogram  12/29/2017*  . Tetanus Vaccine  12/29/2017*  . DEXA scan (bone density measurement)  Completed  . Pneumonia vaccines  Completed  *Topic was postponed. The date shown is not the original due date.

## 2017-10-05 ENCOUNTER — Telehealth: Payer: Self-pay | Admitting: Nurse Practitioner

## 2017-10-05 NOTE — Telephone Encounter (Signed)
Patient called back stating she will just wait until she sees mmm for the Rx.

## 2017-10-12 ENCOUNTER — Encounter: Payer: Self-pay | Admitting: Nurse Practitioner

## 2017-10-12 ENCOUNTER — Telehealth: Payer: Self-pay | Admitting: Nurse Practitioner

## 2017-10-12 ENCOUNTER — Ambulatory Visit (INDEPENDENT_AMBULATORY_CARE_PROVIDER_SITE_OTHER): Payer: PPO | Admitting: Nurse Practitioner

## 2017-10-12 ENCOUNTER — Ambulatory Visit (INDEPENDENT_AMBULATORY_CARE_PROVIDER_SITE_OTHER): Payer: PPO

## 2017-10-12 VITALS — BP 142/82 | HR 72 | Temp 97.3°F | Ht 64.0 in | Wt 184.0 lb

## 2017-10-12 DIAGNOSIS — M8580 Other specified disorders of bone density and structure, unspecified site: Secondary | ICD-10-CM

## 2017-10-12 DIAGNOSIS — F5101 Primary insomnia: Secondary | ICD-10-CM | POA: Diagnosis not present

## 2017-10-12 DIAGNOSIS — G609 Hereditary and idiopathic neuropathy, unspecified: Secondary | ICD-10-CM

## 2017-10-12 DIAGNOSIS — M8588 Other specified disorders of bone density and structure, other site: Secondary | ICD-10-CM

## 2017-10-12 DIAGNOSIS — Z13820 Encounter for screening for osteoporosis: Secondary | ICD-10-CM | POA: Diagnosis not present

## 2017-10-12 DIAGNOSIS — E785 Hyperlipidemia, unspecified: Secondary | ICD-10-CM | POA: Diagnosis not present

## 2017-10-12 DIAGNOSIS — Z6829 Body mass index (BMI) 29.0-29.9, adult: Secondary | ICD-10-CM | POA: Diagnosis not present

## 2017-10-12 DIAGNOSIS — Z780 Asymptomatic menopausal state: Secondary | ICD-10-CM | POA: Diagnosis not present

## 2017-10-12 LAB — CMP14+EGFR
ALK PHOS: 46 IU/L (ref 39–117)
ALT: 24 IU/L (ref 0–32)
AST: 27 IU/L (ref 0–40)
Albumin/Globulin Ratio: 1.9 (ref 1.2–2.2)
Albumin: 4.6 g/dL (ref 3.5–4.7)
BILIRUBIN TOTAL: 0.5 mg/dL (ref 0.0–1.2)
BUN/Creatinine Ratio: 24 (ref 12–28)
BUN: 16 mg/dL (ref 8–27)
CHLORIDE: 101 mmol/L (ref 96–106)
CO2: 28 mmol/L (ref 20–29)
Calcium: 10.2 mg/dL (ref 8.7–10.3)
Creatinine, Ser: 0.68 mg/dL (ref 0.57–1.00)
GFR calc Af Amer: 92 mL/min/{1.73_m2} (ref >59)
GFR calc non Af Amer: 79 mL/min/{1.73_m2} (ref >59)
GLUCOSE: 94 mg/dL (ref 65–99)
Globulin, Total: 2.4 g/dL (ref 1.5–4.5)
POTASSIUM: 4.4 mmol/L (ref 3.5–5.2)
Sodium: 142 mmol/L (ref 134–144)
Total Protein: 7 g/dL (ref 6.0–8.5)

## 2017-10-12 LAB — LIPID PANEL
CHOLESTEROL TOTAL: 125 mg/dL (ref 100–199)
Chol/HDL Ratio: 3.3 ratio (ref 0.0–4.4)
HDL: 38 mg/dL — AB (ref >39)
LDL Calculated: 54 mg/dL (ref 0–99)
Triglycerides: 163 mg/dL — ABNORMAL HIGH (ref 0–149)
VLDL CHOLESTEROL CAL: 33 mg/dL (ref 5–40)

## 2017-10-12 MED ORDER — OMEGA-3-ACID ETHYL ESTERS 1 G PO CAPS: 2.0000 | ORAL_CAPSULE | Freq: Two times a day (BID) | ORAL | 1 refills | Status: DC

## 2017-10-12 MED ORDER — DICLOFENAC SODIUM 1.5 % TD SOLN: TRANSDERMAL | 5 refills | Status: DC

## 2017-10-12 NOTE — Addendum Note (Signed)
Addended by: Chevis Pretty on: 10/12/2017 08:58 AM   Modules accepted: Orders

## 2017-10-12 NOTE — Progress Notes (Addendum)
Subjective:    Patient ID: Wendy Robles, female    DOB: 1929/03/21, 84 y.o.   MRN: [id number]   Chief Complaint: Medical Manage,ment of Chronic issues  HPI:  1. Osteopenia, unspecified location  Last dexascan was 01/15/14. t score of -1.2. She does very little weight bearing exercises.  2. Primary insomnia  Some night she sleeps good an other night she cant sleep but 3 hours. Does not want to take anything  3. Hyperlipidemia with target LDL less than 100  Takes lovaza daily because statins caused myalgia. She tries to avoid fried foods.  4. BMI 29.0-29.9,adult  No recent weight changes  5. Hereditary and idiopathic peripheral neuropathy  She uses OTC creams that help. She has trouble getting up out of chair because sh ecannot always feel her feet. Needs a lift chair.   * blood pressures at home are always below 093'G systolic  Outpatient Encounter Medications as of 10/12/2017  Medication Sig  . acetaminophen (TYLENOL) 650 MG CR tablet Take 650 mg by mouth every 8 (eight) hours as needed for pain.  Marland Kitchen aspirin 81 MG tablet Take 81 mg by mouth daily.    . cephALEXin (KEFLEX) 500 MG capsule Take 1 capsule (500 mg total) by mouth 2 (two) times daily.  . cetirizine (ZYRTEC) 10 MG tablet Take 0.5 tablets (5 mg total) by mouth daily.  . Cholecalciferol (VITAMIN D) 2000 units CAPS Take 1 capsule 2 (two) times daily by mouth.   . fluconazole (DIFLUCAN) 150 MG tablet Take 1 tablet (150 mg total) by mouth every three (3) days as needed.  . fluticasone (FLONASE) 50 MCG/ACT nasal spray Place 2 sprays into both nostrils daily.  . Multiple Vitamin (MULTIVITAMIN) capsule Take 1 capsule by mouth daily.    . NONFORMULARY OR COMPOUNDED ITEM Apply 1 application topically as needed (Relaxing leg cream: Gnaphalium & polycephalum causticum).  Marland Kitchen omega-3 acid ethyl esters (LOVAZA) 1 g capsule Take 2 capsules (2 g total) by mouth 2 (two) times daily.  . polyethylene glycol (MIRALAX / GLYCOLAX) packet Take  17 g by mouth as needed.    . rosuvastatin (CRESTOR) 20 MG tablet Take 1 tablet (20 mg total) by mouth daily.      New complaints: None today  Social history: Lives by herself. No family locally. Has medical alert system but she says she is going to get rid of it.   Review of Systems  Constitutional: Negative for activity change and appetite change.  HENT: Negative.   Eyes: Negative for pain.  Respiratory: Negative for shortness of breath.   Cardiovascular: Negative for chest pain, palpitations and leg swelling.  Gastrointestinal: Negative for abdominal pain.  Endocrine: Negative for polydipsia.  Genitourinary: Negative.   Skin: Negative for rash.  Neurological: Negative for dizziness, weakness and headaches.  Hematological: Does not bruise/bleed easily.  Psychiatric/Behavioral: Negative.   All other systems reviewed and are negative.      Objective:   Physical Exam  Constitutional: She is oriented to person, place, and time. She appears well-developed and well-nourished. No distress.  HENT:  Head: Normocephalic.  Nose: Nose normal.  Mouth/Throat: Oropharynx is clear and moist.  Eyes: Pupils are equal, round, and reactive to light. EOM are normal.  Neck: Normal range of motion. Neck supple. No JVD present. Carotid bruit is not present.  Cardiovascular: Normal rate, regular rhythm, normal heart sounds and intact distal pulses.  Pulmonary/Chest: Effort normal and breath sounds normal. No respiratory distress. She has no wheezes. She  has no rales. She exhibits no tenderness.  Abdominal: Soft. Normal appearance, normal aorta and bowel sounds are normal. She exhibits no distension, no abdominal bruit, no pulsatile midline mass and no mass. There is no splenomegaly or hepatomegaly. There is no tenderness.  Musculoskeletal: Normal range of motion. She exhibits no edema.  Gait slow and steady  Lymphadenopathy:    She has no cervical adenopathy.  Neurological: She is alert and  oriented to person, place, and time. She has normal reflexes.  Skin: Skin is warm and dry.  Psychiatric: She has a normal mood and affect. Her behavior is normal. Judgment and thought content normal.  Nursing note and vitals reviewed.   BP (!) 142/82 (BP Location: Left Arm, Cuff Size: Normal)   Pulse 72   Temp (!) 97.3 F (36.3 C) (Oral)   Ht _0  (1.626 m)   Wt 184 lb (83.5 kg)   BMI 31.58 kg/m        Assessment & Plan:  Wendy Robles comes in today with chief complaint of Medical Management of Chronic Issues   Diagnosis and orders addressed:  1. Osteopenia, unspecified location Weight bearing exercises - DG [facility] DEXA  2. Primary insomnia Bedtime routine  3. Hyperlipidemia with target LDL less than 100 Low fat diet - CMP14+EGFR - Lipid panel - omega-3 acid ethyl esters (LOVAZA) 1 g capsule; Take 2 capsules (2 g total) by mouth 2 (two) times daily.  Dispense: 360 capsule; Refill: 1  4. BMI 29.0-29.9,adult Discussed diet and exercise for person with BMI >25 Will recheck weight in 3-6 months  5. Hereditary and idiopathic peripheral neuropathy Do not go barefooted Order given to patient for lift chir - Diclofenac Sodium 1.5 % SOLN; 2g to affected area daily  Dispense: 5 Bottle; Refill: 5   Labs pending Health Maintenance reviewed Diet and exercise encouraged  Follow up plan: 3 months   Mary-Margaret Hassell Done, FNP

## 2017-10-12 NOTE — Patient Instructions (Signed)

## 2017-10-16 ENCOUNTER — Telehealth: Payer: Self-pay | Admitting: Nurse Practitioner

## 2017-10-16 DIAGNOSIS — G609 Hereditary and idiopathic neuropathy, unspecified: Secondary | ICD-10-CM | POA: Diagnosis not present

## 2017-10-16 NOTE — Telephone Encounter (Signed)
Tye Maryland can you please check on this I tried to call patient but could not reach her

## 2017-10-16 NOTE — Addendum Note (Signed)
Addended by: Ilean China on: 10/16/2017 01:55 PM   Modules accepted: Level of Service

## 2017-10-17 NOTE — Telephone Encounter (Signed)
Pt to call her insurance company to clarify what they are needing

## 2017-10-17 NOTE — Telephone Encounter (Signed)
[employer] will contact patient with a list of in network DME supplies for her to take the Rx for the lift chair to, if the chair is less than $500 that is all that will need to be done. If not a PA will need to be done by calling [phone]

## 2017-12-05 ENCOUNTER — Ambulatory Visit (INDEPENDENT_AMBULATORY_CARE_PROVIDER_SITE_OTHER): Payer: PPO

## 2017-12-05 DIAGNOSIS — Z23 Encounter for immunization: Secondary | ICD-10-CM | POA: Diagnosis not present

## 2018-01-01 ENCOUNTER — Ambulatory Visit: Payer: PPO | Admitting: Nurse Practitioner

## 2018-01-18 ENCOUNTER — Ambulatory Visit: Payer: PPO | Admitting: Nurse Practitioner

## 2018-02-06 ENCOUNTER — Ambulatory Visit: Payer: PPO | Admitting: Nurse Practitioner

## 2018-02-13 ENCOUNTER — Encounter: Payer: Self-pay | Admitting: Nurse Practitioner

## 2018-02-13 ENCOUNTER — Ambulatory Visit (INDEPENDENT_AMBULATORY_CARE_PROVIDER_SITE_OTHER): Payer: PPO | Admitting: Nurse Practitioner

## 2018-02-13 VITALS — BP 162/96 | HR 74 | Temp 97.6°F | Ht 64.0 in | Wt 188.0 lb

## 2018-02-13 DIAGNOSIS — Z6829 Body mass index (BMI) 29.0-29.9, adult: Secondary | ICD-10-CM | POA: Diagnosis not present

## 2018-02-13 DIAGNOSIS — N3000 Acute cystitis without hematuria: Secondary | ICD-10-CM

## 2018-02-13 DIAGNOSIS — F5101 Primary insomnia: Secondary | ICD-10-CM | POA: Diagnosis not present

## 2018-02-13 DIAGNOSIS — E785 Hyperlipidemia, unspecified: Secondary | ICD-10-CM | POA: Diagnosis not present

## 2018-02-13 DIAGNOSIS — M8580 Other specified disorders of bone density and structure, unspecified site: Secondary | ICD-10-CM | POA: Diagnosis not present

## 2018-02-13 DIAGNOSIS — I10 Essential (primary) hypertension: Secondary | ICD-10-CM | POA: Diagnosis not present

## 2018-02-13 DIAGNOSIS — G609 Hereditary and idiopathic neuropathy, unspecified: Secondary | ICD-10-CM

## 2018-02-13 DIAGNOSIS — R300 Dysuria: Secondary | ICD-10-CM

## 2018-02-13 LAB — CMP14+EGFR
ALT: 27 IU/L (ref 0–32)
AST: 25 IU/L (ref 0–40)
Albumin/Globulin Ratio: 1.9 (ref 1.2–2.2)
Albumin: 4.6 g/dL (ref 3.5–4.7)
Alkaline Phosphatase: 54 IU/L (ref 39–117)
BUN/Creatinine Ratio: 20 (ref 12–28)
BUN: 13 mg/dL (ref 8–27)
Bilirubin Total: 0.3 mg/dL (ref 0.0–1.2)
CO2: 26 mmol/L (ref 20–29)
Calcium: 10.3 mg/dL (ref 8.7–10.3)
Chloride: 98 mmol/L (ref 96–106)
Creatinine, Ser: 0.66 mg/dL (ref 0.57–1.00)
GFR calc Af Amer: 93 mL/min/{1.73_m2} (ref >59)
GFR calc non Af Amer: 80 mL/min/{1.73_m2} (ref >59)
GLUCOSE: 92 mg/dL (ref 65–99)
Globulin, Total: 2.4 g/dL (ref 1.5–4.5)
Potassium: 4.2 mmol/L (ref 3.5–5.2)
Sodium: 140 mmol/L (ref 134–144)
TOTAL PROTEIN: 7 g/dL (ref 6.0–8.5)

## 2018-02-13 LAB — URINALYSIS, COMPLETE
BILIRUBIN UA: NEGATIVE
Glucose, UA: NEGATIVE
Ketones, UA: NEGATIVE
Nitrite, UA: NEGATIVE
PH UA: 6 (ref 5.0–7.5)
Protein, UA: NEGATIVE
Specific Gravity, UA: 1.01 (ref 1.005–1.030)
Urobilinogen, Ur: 0.2 mg/dL (ref 0.2–1.0)

## 2018-02-13 LAB — LIPID PANEL
Chol/HDL Ratio: 3.3 ratio (ref 0.0–4.4)
Cholesterol, Total: 137 mg/dL (ref 100–199)
HDL: 42 mg/dL (ref >39)
LDL Calculated: 61 mg/dL (ref 0–99)
Triglycerides: 171 mg/dL — ABNORMAL HIGH (ref 0–149)
VLDL CHOLESTEROL CAL: 34 mg/dL (ref 5–40)

## 2018-02-13 LAB — MICROSCOPIC EXAMINATION: WBC, UA: >30 /hpf — AB (ref 0–5)

## 2018-02-13 MED ORDER — HYDROCHLOROTHIAZIDE 25 MG PO TABS: 25.0000 mg | ORAL_TABLET | Freq: Every day | ORAL | 3 refills | Status: DC

## 2018-02-13 MED ORDER — OMEGA-3-ACID ETHYL ESTERS 1 G PO CAPS: 2.0000 | ORAL_CAPSULE | Freq: Two times a day (BID) | ORAL | 1 refills | Status: DC

## 2018-02-13 MED ORDER — CIPROFLOXACIN HCL 500 MG PO TABS: 500.0000 mg | ORAL_TABLET | Freq: Two times a day (BID) | ORAL | 0 refills | Status: DC

## 2018-02-13 MED ORDER — ROSUVASTATIN CALCIUM 20 MG PO TABS: 20.0000 mg | ORAL_TABLET | Freq: Every day | ORAL | 3 refills | Status: DC

## 2018-02-13 NOTE — Progress Notes (Signed)
Subjective:    Patient ID: Wendy Robles, female    DOB: 1929/05/26, 85 y.o.   MRN:   Chief Complaint: Medical Management of Chronic Issues   HPI:  1. Dysuria  Started about 2 days ago and urine is very foul smelling.  2. Primary insomnia  Sleep okay except for having to get up to void.  3. Hyperlipidemia with target LDL less than 100  Not really watching diet. Does no exercise due to unsteady gait.  4. BMI 29.0-29.9,adult  Weight is up 4 lbs  5. Hereditary and idiopathic peripheral neuropathy  Uses diclofenac cream which helps  6. Osteopenia, unspecified location  dexascan was done 10/12/17 with t score of -0.3which was normal. She does take a vitamin d and calcium supplement.    Outpatient Encounter Medications as of 02/13/2018  Medication Sig  . acetaminophen (TYLENOL) 650 MG CR tablet Take 650 mg by mouth every 8 (eight) hours as needed for pain.  Marland Kitchen aspirin 81 MG tablet Take 81 mg by mouth daily.    . cetirizine (ZYRTEC) 10 MG tablet Take 0.5 tablets (5 mg total) by mouth daily.  . Cholecalciferol (VITAMIN D) 2000 units CAPS Take 1 capsule 2 (two) times daily by mouth.   . Diclofenac Sodium 1.5 % SOLN 2g to affected area daily  . fluticasone (FLONASE) 50 MCG/ACT nasal spray Place 2 sprays into both nostrils daily.  . Multiple Vitamin (MULTIVITAMIN) capsule Take 1 capsule by mouth daily.    . NONFORMULARY OR COMPOUNDED ITEM Apply 1 application topically as needed (Relaxing leg cream: Gnaphalium & polycephalum causticum).  Marland Kitchen omega-3 acid ethyl esters (LOVAZA) 1 g capsule Take 2 capsules (2 g total) by mouth 2 (two) times daily.  . polyethylene glycol (MIRALAX / GLYCOLAX) packet Take 17 g by mouth as needed.    . rosuvastatin (CRESTOR) 20 MG tablet Take 1 tablet (20 mg total) by mouth daily.       New complaints: None today  Social history: Lives iwth husband- has a great grand babay that she Engineer, civil (consulting) and she really enjoyed see him.   Review of Systems    Constitutional: Negative for activity change and appetite change.  HENT: Negative.   Eyes: Negative for pain.  Respiratory: Negative for shortness of breath.   Cardiovascular: Negative for chest pain, palpitations and leg swelling.  Gastrointestinal: Negative for abdominal pain.  Endocrine: Negative for polydipsia.  Genitourinary: Negative.   Skin: Negative for rash.  Neurological: Negative for dizziness, weakness and headaches.  Hematological: Does not bruise/bleed easily.  Psychiatric/Behavioral: Negative.   All other systems reviewed and are negative.      Objective:   Physical Exam Vitals signs and nursing note reviewed.  Constitutional:      General: She is not in acute distress.    Appearance: Normal appearance. She is well-developed.  HENT:     Head: Normocephalic.     Nose: Nose normal.  Eyes:     Pupils: Pupils are equal, round, and reactive to light.  Neck:     Musculoskeletal: Normal range of motion and neck supple.     Vascular: No carotid bruit or JVD.  Cardiovascular:     Rate and Rhythm: Normal rate and regular rhythm.     Heart sounds: Normal heart sounds.  Pulmonary:     Effort: Pulmonary effort is normal. No respiratory distress.     Breath sounds: Normal breath sounds. No wheezing or rales.  Chest:     Chest wall:  No tenderness.  Abdominal:     General: Bowel sounds are normal. There is no distension or abdominal bruit.     Palpations: Abdomen is soft. There is no hepatomegaly, splenomegaly, mass or pulsatile mass.     Tenderness: There is no abdominal tenderness.  Musculoskeletal: Normal range of motion.     Comments: Unable to get on exam table Using a walker to walk. Gait slow and steady today  Lymphadenopathy:     Cervical: No cervical adenopathy.  Skin:    General: Skin is warm and dry.  Neurological:     Mental Status: She is alert and oriented to person, place, and time.     Deep Tendon Reflexes: Reflexes are normal and symmetric.   Psychiatric:        Behavior: Behavior normal.        Thought Content: Thought content normal.        Judgment: Judgment normal.    BP (!) 162/96   Pulse 74   Temp 97.6 F (36.4 C) (Oral)   Ht 5\' 4"  (1.626 m)   Wt 188 lb (85.3 kg)   BMI 32.27 kg/m          Assessment & Plan:  Wendy Robles comes in today with chief complaint of Medical Management of Chronic Issues   Diagnosis and orders addressed:  1. Dysuria - Urinalysis, Complete  2. Primary insomnia Bedtime routine Try not to drink anything 2 hours prior to bedtime  3. Hyperlipidemia with target LDL less than 100 Low fat diet - omega-3 acid ethyl esters (LOVAZA) 1 g capsule; Take 2 capsules (2 g total) by mouth 2 (two) times daily.  Dispense: 360 capsule; Refill: 1 - rosuvastatin (CRESTOR) 20 MG tablet; Take 1 tablet (20 mg total) by mouth daily.  Dispense: 90 tablet; Refill: 3  4. BMI 29.0-29.9,adult Discussed diet and exercise for person with BMI >25 Will recheck weight in 3-6 months  5. Hereditary and idiopathic peripheral neuropathy Wants to continue voltaren gel  6. Osteopenia, unspecified location Cannot tolerate weight bearing exercises Continue daily vitamin d a abd calcium  7. Essential hypertension Low sodium diet Added HCTZ to meds Keep diary of blood pressure - hydrochlorothiazide (HYDRODIURIL) 25 MG tablet; Take 1 tablet (25 mg total) by mouth daily.  Dispense: 90 tablet; Refill: 3  8. Acute cystitis without hematuria Take medication as prescribe Cotton underwear Take shower not bath Cranberry juice, yogurt Force fluids AZO over the counter X2 days Culture pending RTO prn  - ciprofloxacin (CIPRO) 500 MG tablet; Take 1 tablet (500 mg total) by mouth 2 (two) times daily.  Dispense: 10 tablet; Refill: 0 - Urine Culture   Labs pending Health Maintenance reviewed Diet and exercise encouraged  Follow up plan: 3 months   Mary-Margaret Hassell Done, FNP

## 2018-02-13 NOTE — Addendum Note (Signed)
Addended by: Rolena Infante on: 02/13/2018 12:51 PM   Modules accepted: Orders

## 2018-02-13 NOTE — Patient Instructions (Signed)

## 2018-02-15 LAB — URINE CULTURE

## 2018-03-29 DIAGNOSIS — H40033 Anatomical narrow angle, bilateral: Secondary | ICD-10-CM | POA: Diagnosis not present

## 2018-03-29 DIAGNOSIS — G44209 Tension-type headache, unspecified, not intractable: Secondary | ICD-10-CM | POA: Diagnosis not present

## 2018-04-23 ENCOUNTER — Telehealth: Payer: Self-pay | Admitting: Nurse Practitioner

## 2018-04-23 NOTE — Telephone Encounter (Signed)
appt made.  Patient aware  

## 2018-04-23 NOTE — Telephone Encounter (Signed)
Needs to be seen to make sure that getting correct meds

## 2018-04-25 ENCOUNTER — Ambulatory Visit: Payer: PPO | Admitting: Nurse Practitioner

## 2018-04-25 ENCOUNTER — Other Ambulatory Visit: Payer: Self-pay

## 2018-04-25 ENCOUNTER — Telehealth: Payer: Self-pay | Admitting: Nurse Practitioner

## 2018-04-25 ENCOUNTER — Telehealth (INDEPENDENT_AMBULATORY_CARE_PROVIDER_SITE_OTHER): Payer: PPO | Admitting: Nurse Practitioner

## 2018-04-25 DIAGNOSIS — E785 Hyperlipidemia, unspecified: Secondary | ICD-10-CM

## 2018-04-25 DIAGNOSIS — N3000 Acute cystitis without hematuria: Secondary | ICD-10-CM

## 2018-04-25 MED ORDER — OMEGA-3-ACID ETHYL ESTERS 1 G PO CAPS: 2.0000 | ORAL_CAPSULE | Freq: Two times a day (BID) | ORAL | 1 refills | Status: DC

## 2018-04-25 MED ORDER — DOXYCYCLINE HYCLATE 100 MG PO TABS: 100.0000 mg | ORAL_TABLET | Freq: Two times a day (BID) | ORAL | 0 refills | Status: DC

## 2018-04-25 NOTE — Progress Notes (Signed)
   Virtual Visit via telephone Note  I connected with Wendy Robles on 04/25/18 at 8:35 by telephone and verified that I am speaking with the correct person using two identifiers. EQUILLA QUE is currently located at home and no one is currently with her during visit. The provider, Mary-Margaret Hassell Done, FNP is located in their office at time of visit.  I discussed the limitations, risks, security and privacy concerns of performing an evaluation and management service by telephone and the availability of in person appointments. I also discussed with the patient that there may be a patient responsible charge related to this service. The patient expressed understanding and agreed to proceed.   History and Present Illness:   Chief Complaint: foul smelling urine with freg and urgency X 1 week  HPI Patient calls in today c/o dysuria freq and urgency that started over a week ago. Urine has a very foul smell.   History obtained from the patient General ROS: negative for - chills, fatigue or fever Genito-Urinary ROS: positive for - dysuria, pelvic pain and urinary frequency/urgency   Observations/Objective: Alert and oriented- answers all questions appropriately BP 134/82-at home this morning- no frver  Assessment and Plan:  Wendy Robles in today with chief complaint of foul smelling uirne  1. Acute cystitis without hematuria Take medication as prescribe Cotton underwear Take shower not bath Cranberry juice, yogurt Force fluids AZO over the counter X2 days Culture pending RTO prn Meds ordered this encounter  Medications  . doxycycline (VIBRA-TABS) 100 MG tablet    Sig: Take 1 tablet (100 mg total) by mouth 2 (two) times daily. 1 po bid    Dispense:  14 tablet    Refill:  0    Order Specific Question:   Supervising Provider    Answer:   Caryl Pina A [[id number]]     2. Hyperlipidemia with target LDL less than 100 *refill on meds - omega-3 acid ethyl esters  (LOVAZA) 1 g capsule; Take 2 capsules (2 g total) by mouth 2 (two) times daily.  Dispense: 360 capsule; Refill: 1  Follow Up Instructions: prn    I discussed the assessment and treatment plan with the patient. The patient was provided an opportunity to ask questions and all were answered. The patient agreed with the plan and demonstrated an understanding of the instructions.   The patient was advised to call back or seek an in-person evaluation if the symptoms worsen or if the condition fails to improve as anticipated.  The above assessment and management plan was discussed with the patient. The patient verbalized understanding of and has agreed to the management plan. Patient is aware to call the clinic if symptoms persist or worsen. Patient is aware when to return to the clinic for a follow-up visit. Patient educated on when it is appropriate to go to the emergency department.    I provided 8 minutes of non-face-to-face time during this encounter.    Mary-Margaret Hassell Done, FNP

## 2018-04-25 NOTE — Telephone Encounter (Signed)
When I spoke with patient on phone she said that the cipro made her weak and I told her we were going to call insomething different this time. That is why she got doxycycline.

## 2018-04-25 NOTE — Patient Instructions (Signed)
Take medication as prescribe Cotton underwear Take shower not bath Cranberry juice, yogurt Force fluids AZO over the counter X2 days Culture pending RTO prn  

## 2018-04-25 NOTE — Telephone Encounter (Signed)
Please review and advise.

## 2018-04-25 NOTE — Telephone Encounter (Signed)
Patient notified and verbalized understanding. 

## 2018-05-17 ENCOUNTER — Ambulatory Visit (INDEPENDENT_AMBULATORY_CARE_PROVIDER_SITE_OTHER): Payer: PPO | Admitting: Nurse Practitioner

## 2018-05-17 ENCOUNTER — Other Ambulatory Visit: Payer: Self-pay

## 2018-05-17 ENCOUNTER — Encounter: Payer: Self-pay | Admitting: Nurse Practitioner

## 2018-05-17 DIAGNOSIS — F5101 Primary insomnia: Secondary | ICD-10-CM | POA: Diagnosis not present

## 2018-05-17 DIAGNOSIS — G609 Hereditary and idiopathic neuropathy, unspecified: Secondary | ICD-10-CM | POA: Diagnosis not present

## 2018-05-17 DIAGNOSIS — E785 Hyperlipidemia, unspecified: Secondary | ICD-10-CM | POA: Diagnosis not present

## 2018-05-17 DIAGNOSIS — M8580 Other specified disorders of bone density and structure, unspecified site: Secondary | ICD-10-CM

## 2018-05-17 DIAGNOSIS — I10 Essential (primary) hypertension: Secondary | ICD-10-CM | POA: Diagnosis not present

## 2018-05-17 DIAGNOSIS — Z6829 Body mass index (BMI) 29.0-29.9, adult: Secondary | ICD-10-CM | POA: Diagnosis not present

## 2018-05-17 NOTE — Progress Notes (Signed)
Patient ID: Wendy Robles, female   DOB: [birth year], 85 y.o.   MRN: [id number]    Virtual Visit via telephone Note  I connected with Wendy Robles on 05/17/18 at 9:45 AM by telephone and verified that I am speaking with the correct person using two identifiers. Wendy Robles is currently located at home and no one is currently with her during visit. The provider, Mary-Margaret Hassell Done, FNP is located in their office at time of visit.  I discussed the limitations, risks, security and privacy concerns of performing an evaluation and management service by telephone and the availability of in person appointments. I also discussed with the patient that there may be a patient responsible charge related to this service. The patient expressed understanding and agreed to proceed.   History and Present Illness:   Chief Complaint: medical management of chronic issues   HPI:  1. Hyperlipidemia with target LDL less than 100 tries to watch diet. Has not been doing much exercise.  2. Primary insomnia Sleeps good about 6 nights a week  3. Osteopenia, unspecified location Last dexascan was done on 10/12/17 with t sore of -.03   4. BMI 29.0-29.9,adult Weight has gone up some since she has been quarantined  5. Hereditary and idiopathic peripheral neuropathy Has constant burning in legs and feet. No changes. Uses a topical ointment which seem sto help.    Outpatient Encounter Medications as of 05/17/2018  Medication Sig  . acetaminophen (TYLENOL) 650 MG CR tablet Take 650 mg by mouth every 8 (eight) hours as needed for pain.  Marland Kitchen aspirin 81 MG tablet Take 81 mg by mouth daily.    . cetirizine (ZYRTEC) 10 MG tablet Take 0.5 tablets (5 mg total) by mouth daily.  . Cholecalciferol (VITAMIN D) 2000 units CAPS Take 1 capsule 2 (two) times daily by mouth.   . Diclofenac Sodium 1.5 % SOLN 2g to affected area daily  . fluticasone (FLONASE) 50 MCG/ACT nasal spray Place 2 sprays into both nostrils daily.   . hydrochlorothiazide (HYDRODIURIL) 25 MG tablet Take 1 tablet (25 mg total) by mouth daily.  . Multiple Vitamin (MULTIVITAMIN) capsule Take 1 capsule by mouth daily.    . NONFORMULARY OR COMPOUNDED ITEM Apply 1 application topically as needed (Relaxing leg cream: Gnaphalium & polycephalum causticum).  Marland Kitchen omega-3 acid ethyl esters (LOVAZA) 1 g capsule Take 2 capsules (2 g total) by mouth 2 (two) times daily.  . polyethylene glycol (MIRALAX / GLYCOLAX) packet Take 17 g by mouth as needed.    . rosuvastatin (CRESTOR) 20 MG tablet Take 1 tablet (20 mg total) by mouth daily.       New complaints: None today  Social history: Lives by herself- has fanily and friends that check on her daily       Review of Systems  Constitutional: Negative for diaphoresis and weight loss.  Eyes: Negative for blurred vision, double vision and pain.  Respiratory: Negative for shortness of breath.   Cardiovascular: Negative for chest pain, palpitations, orthopnea and leg swelling.  Gastrointestinal: Negative for abdominal pain.  Skin: Negative for rash.  Neurological: Negative for dizziness, sensory change, loss of consciousness, weakness and headaches.  Endo/Heme/Allergies: Negative for polydipsia. Does not bruise/bleed easily.  Psychiatric/Behavioral: Negative for memory loss. The patient does not have insomnia.   All other systems reviewed and are negative.    Observations/Objective: Alert and oriented No distressnoted  Assessment and Plan: Wendy Robles comes in today with chief complaint of Medical Management  of Chronic Issues   Diagnosis and orders addressed:  1. Hyperlipidemia with target LDL less than 100 Low fat diet  2. Primary insomnia Bedtime routine  3. Osteopenia, unspecified location Weight bearing exercise  4. BMI 29.0-29.9,adult Discussed diet and exercise for person with BMI >25 Will recheck weight in 3-6 months  5. Hereditary and idiopathic peripheral neuropathy  continue diclofenac cream  6. Essential hypertension Low sodium diet   Previous labs reviewed Health Maintenance reviewed Diet and exercise encouraged  Follow up plan: 3 months     I discussed the assessment and treatment plan with the patient. The patient was provided an opportunity to ask questions and all were answered. The patient agreed with the plan and demonstrated an understanding of the instructions.   The patient was advised to call back or seek an in-person evaluation if the symptoms worsen or if the condition fails to improve as anticipated.  The above assessment and management plan was discussed with the patient. The patient verbalized understanding of and has agreed to the management plan. Patient is aware to call the clinic if symptoms persist or worsen. Patient is aware when to return to the clinic for a follow-up visit. Patient educated on when it is appropriate to go to the emergency department.    I provided 12 minutes of non-face-to-face time during this encounter.    Mary-Margaret Hassell Done, FNP

## 2018-06-01 ENCOUNTER — Ambulatory Visit (INDEPENDENT_AMBULATORY_CARE_PROVIDER_SITE_OTHER): Payer: PPO | Admitting: Nurse Practitioner

## 2018-06-01 ENCOUNTER — Other Ambulatory Visit: Payer: Self-pay

## 2018-06-01 ENCOUNTER — Encounter: Payer: Self-pay | Admitting: Nurse Practitioner

## 2018-06-01 DIAGNOSIS — N3000 Acute cystitis without hematuria: Secondary | ICD-10-CM

## 2018-06-01 MED ORDER — CIPROFLOXACIN HCL 500 MG PO TABS: 500.0000 mg | ORAL_TABLET | Freq: Two times a day (BID) | ORAL | 0 refills | Status: DC

## 2018-06-01 NOTE — Progress Notes (Signed)
Patient ID: Wendy Robles, female   DOB: [birth year], 85 y.o.   MRN: [id number]    Virtual Visit via telephone Note  I connected with Wendy Robles on 06/01/18 at 9:30 by telephone and verified that I am speaking with the correct person using two identifiers. Wendy Robles is currently located at [facility] and no one is currently with her during visit. The provider, Mary-Margaret Hassell Done, FNP is located in their office at time of visit.  I discussed the limitations, risks, security and privacy concerns of performing an evaluation and management service by telephone and the availability of in person appointments. I also discussed with the patient that there may be a patient responsible charge related to this service. The patient expressed understanding and agreed to proceed.   History and Present Illness:    Chief Complaint: Urinary Tract Infection   HPI patient calls in c/o of dysuria again. She says it started about 3 days ago. Gets one every month. Does not use proper hygiene ( wiping from front to back ). Frequency has worsened. Denies abdominal pain or back pain.     Review of Systems  Constitutional: Negative for diaphoresis and weight loss.  Eyes: Negative for blurred vision, double vision and pain.  Respiratory: Negative for shortness of breath.   Cardiovascular: Negative for chest pain, palpitations, orthopnea and leg swelling.  Gastrointestinal: Negative for abdominal pain.  Genitourinary: Positive for dysuria, frequency and urgency. Negative for flank pain and hematuria.  Skin: Negative for rash.  Neurological: Negative for dizziness, sensory change, loss of consciousness, weakness and headaches.  Endo/Heme/Allergies: Negative for polydipsia. Does not bruise/bleed easily.  Psychiatric/Behavioral: Negative for memory loss. The patient does not have insomnia.   All other systems reviewed and are negative.    Observations/Objective: Alert and oreinted- answers lall questions  appropriately No distress in voice  Assessment and Plan: Wendy Robles in today with chief complaint of Urinary Tract Infection   1. Acute cystitis without hematuria Take medication as prescribe Cotton underwear Take shower not bath Cranberry juice, yogurt Force fluids AZO over the counter X2 days RTO prn  - ciprofloxacin (CIPRO) 500 MG tablet; Take 1 tablet (500 mg total) by mouth 2 (two) times daily.  Dispense: 10 tablet; Refill: 0   Follow Up Instructions: prn    I discussed the assessment and treatment plan with the patient. The patient was provided an opportunity to ask questions and all were answered. The patient agreed with the plan and demonstrated an understanding of the instructions.   The patient was advised to call back or seek an in-person evaluation if the symptoms worsen or if the condition fails to improve as anticipated.  The above assessment and management plan was discussed with the patient. The patient verbalized understanding of and has agreed to the management plan. Patient is aware to call the clinic if symptoms persist or worsen. Patient is aware when to return to the clinic for a follow-up visit. Patient educated on when it is appropriate to go to the emergency department.   Time call ended:  9:40  I provided 10 minutes of non-face-to-face time during this encounter.    Mary-Margaret Hassell Done, FNP

## 2018-06-15 ENCOUNTER — Ambulatory Visit (INDEPENDENT_AMBULATORY_CARE_PROVIDER_SITE_OTHER): Payer: PPO | Admitting: Nurse Practitioner

## 2018-06-15 ENCOUNTER — Other Ambulatory Visit: Payer: Self-pay

## 2018-06-15 ENCOUNTER — Encounter: Payer: Self-pay | Admitting: Nurse Practitioner

## 2018-06-15 DIAGNOSIS — N3000 Acute cystitis without hematuria: Secondary | ICD-10-CM

## 2018-06-15 DIAGNOSIS — E785 Hyperlipidemia, unspecified: Secondary | ICD-10-CM

## 2018-06-15 LAB — LIPID PANEL
Chol/HDL Ratio: 3.2 ratio (ref 0.0–4.4)
Cholesterol, Total: 126 mg/dL (ref 100–199)
HDL: 39 mg/dL — ABNORMAL LOW (ref >39)
LDL Calculated: 52 mg/dL (ref 0–99)
Triglycerides: 175 mg/dL — ABNORMAL HIGH (ref 0–149)
VLDL Cholesterol Cal: 35 mg/dL (ref 5–40)

## 2018-06-15 LAB — URINALYSIS, COMPLETE
Bilirubin, UA: NEGATIVE
Glucose, UA: NEGATIVE
Ketones, UA: NEGATIVE
Leukocytes,UA: NEGATIVE
Nitrite, UA: NEGATIVE
Protein,UA: NEGATIVE
Specific Gravity, UA: 1.015 (ref 1.005–1.030)
Urobilinogen, Ur: 0.2 mg/dL (ref 0.2–1.0)
pH, UA: 7.5 (ref 5.0–7.5)

## 2018-06-15 LAB — CMP14+EGFR
ALT: 33 IU/L — ABNORMAL HIGH (ref 0–32)
AST: 29 IU/L (ref 0–40)
Albumin/Globulin Ratio: 2 (ref 1.2–2.2)
Albumin: 4.5 g/dL (ref 3.6–4.6)
Alkaline Phosphatase: 48 IU/L (ref 39–117)
BUN/Creatinine Ratio: 25 (ref 12–28)
BUN: 17 mg/dL (ref 8–27)
Bilirubin Total: 0.4 mg/dL (ref 0.0–1.2)
CO2: 25 mmol/L (ref 20–29)
Calcium: 10.2 mg/dL (ref 8.7–10.3)
Chloride: 98 mmol/L (ref 96–106)
Creatinine, Ser: 0.69 mg/dL (ref 0.57–1.00)
GFR calc Af Amer: 91 mL/min/{1.73_m2} (ref >59)
GFR calc non Af Amer: 79 mL/min/{1.73_m2} (ref >59)
Globulin, Total: 2.3 g/dL (ref 1.5–4.5)
Glucose: 92 mg/dL (ref 65–99)
Potassium: 4.9 mmol/L (ref 3.5–5.2)
Sodium: 138 mmol/L (ref 134–144)
Total Protein: 6.8 g/dL (ref 6.0–8.5)

## 2018-06-15 LAB — MICROSCOPIC EXAMINATION: Renal Epithel, UA: NONE SEEN /hpf

## 2018-06-15 MED ORDER — DOXYCYCLINE HYCLATE 100 MG PO TABS: 100.0000 mg | ORAL_TABLET | Freq: Two times a day (BID) | ORAL | 0 refills | Status: DC

## 2018-06-15 NOTE — Progress Notes (Signed)
   Virtual Visit via telephone Note  I connected with Wendy Robles on 06/15/18 at 9:20 AM by telephone and verified that I am speaking with the correct person using two identifiers. Wendy Robles is currently located at home and no one is currently with her during visit. The provider, Mary-Margaret Hassell Done, FNP is located in their office at time of visit.  I discussed the limitations, risks, security and privacy concerns of performing an evaluation and management service by telephone and the availability of in person appointments. I also discussed with the patient that there may be a patient responsible charge related to this service. The patient expressed understanding and agreed to proceed.   History and Present Illness:  Wendy Robles in today with chief complaint of Urinary Tract Infection   Patient calls in today with recurrent symptoms of UTI. She has been battling this since January. She has been  Cipro, bactrim and then cipro again. This time the infection has not resolved. In [place] urine culture grew [employer]. Symtoms are really bothering her.     Review of Systems  Genitourinary: Positive for dysuria, frequency and urgency.  All other systems reviewed and are negative.    Observations/Objective: Alert and oriented No distress  Assessment and Plan: Wendy Robles in today with chief complaint of Urinary Tract Infection   1. Hyperlipidemia with target LDL less than 100 Just ordered lab work because she was going by office to leave a urine. She had telephone visit a week ago for chroinc problems - CMP14+EGFR - Lipid panel  2. Acute cystitis without hematuria Take medication as prescribe Cotton underwear Take shower not bath Cranberry juice, yogurt Force fluids AZO over the counter X2 days Culture pending RTO prn  - Urine Culture - Urinalysis, Complete   Follow Up Instructions: prn    I discussed the assessment and treatment plan with the patient.  The patient was provided an opportunity to ask questions and all were answered. The patient agreed with the plan and demonstrated an understanding of the instructions.   The patient was advised to call back or seek an in-person evaluation if the symptoms worsen or if the condition fails to improve as anticipated.  The above assessment and management plan was discussed with the patient. The patient verbalized understanding of and has agreed to the management plan. Patient is aware to call the clinic if symptoms persist or worsen. Patient is aware when to return to the clinic for a follow-up visit. Patient educated on when it is appropriate to go to the emergency department.   Time call ended:  9:36 I provided 20 minutes of non-face-to-face time during this encounter.    Mary-Margaret Hassell Done, FNP

## 2018-06-16 LAB — URINE CULTURE: Organism ID, Bacteria: NO GROWTH

## 2018-08-21 ENCOUNTER — Encounter: Payer: Self-pay | Admitting: Nurse Practitioner

## 2018-08-21 ENCOUNTER — Ambulatory Visit (INDEPENDENT_AMBULATORY_CARE_PROVIDER_SITE_OTHER): Payer: PPO | Admitting: Nurse Practitioner

## 2018-08-21 DIAGNOSIS — Z6829 Body mass index (BMI) 29.0-29.9, adult: Secondary | ICD-10-CM

## 2018-08-21 DIAGNOSIS — G609 Hereditary and idiopathic neuropathy, unspecified: Secondary | ICD-10-CM

## 2018-08-21 DIAGNOSIS — E785 Hyperlipidemia, unspecified: Secondary | ICD-10-CM | POA: Diagnosis not present

## 2018-08-21 DIAGNOSIS — M8580 Other specified disorders of bone density and structure, unspecified site: Secondary | ICD-10-CM

## 2018-08-21 DIAGNOSIS — F5101 Primary insomnia: Secondary | ICD-10-CM | POA: Diagnosis not present

## 2018-08-21 DIAGNOSIS — I10 Essential (primary) hypertension: Secondary | ICD-10-CM | POA: Diagnosis not present

## 2018-08-21 MED ORDER — OMEGA-3-ACID ETHYL ESTERS 1 G PO CAPS: 2.0000 | ORAL_CAPSULE | Freq: Two times a day (BID) | ORAL | 1 refills | Status: DC

## 2018-08-21 MED ORDER — ROSUVASTATIN CALCIUM 20 MG PO TABS: 20.0000 mg | ORAL_TABLET | Freq: Every day | ORAL | 1 refills | Status: DC

## 2018-08-21 NOTE — Progress Notes (Signed)
Virtual Visit via telephone Note  I connected with Wendy Robles on 08/21/18 at 10:40 by telephone and verified that I am speaking with the correct person using two identifiers. Wendy Robles is currently located at home and no one is currently with her during visit. The provider, Mary-Margaret Hassell Done, FNP is located in their office at time of visit.  I discussed the limitations, risks, security and privacy concerns of performing an evaluation and management service by telephone and the availability of in person appointments. I also discussed with the patient that there may be a patient responsible charge related to this service. The patient expressed understanding and agreed to proceed.   History and Present Illness:   Chief Complaint: Medical Management of Chronic Issues    HPI:  1. Hyperlipidemia with target LDL less than 100 patients tries to avoid a lot of fried foods. Very little exercise  2. Hereditary and idiopathic peripheral neuropathy Has burning of bil feet. She is using some OTC cream that helps.   3. Osteopenia, unspecified location Last dexascan was done 10/19/17 with t score of -0.3. is doing well. Is on no vitamin d or calcium supplement  4. Primary insomnia Has had no problems lately. Thinks if she did not have to go to the restroom so often  5. BMI 29.0-29.9,adult She has gained 8-10 lbs.    Outpatient Encounter Medications as of 08/21/2018  Medication Sig  . acetaminophen (TYLENOL) 650 MG CR tablet Take 650 mg by mouth every 8 (eight) hours as needed for pain.  Marland Kitchen aspirin 81 MG tablet Take 81 mg by mouth daily.    . cetirizine (ZYRTEC) 10 MG tablet Take 0.5 tablets (5 mg total) by mouth daily.  . Cholecalciferol (VITAMIN D) 2000 units CAPS Take 1 capsule 2 (two) times daily by mouth.   . ciprofloxacin (CIPRO) 500 MG tablet Take 1 tablet (500 mg total) by mouth 2 (two) times daily.  . Diclofenac Sodium 1.5 % SOLN 2g to affected area daily  . doxycycline  (VIBRA-TABS) 100 MG tablet Take 1 tablet (100 mg total) by mouth 2 (two) times daily. 1 po bid  . fluticasone (FLONASE) 50 MCG/ACT nasal spray Place 2 sprays into both nostrils daily.  . hydrochlorothiazide (HYDRODIURIL) 25 MG tablet Take 1 tablet (25 mg total) by mouth daily.  . Multiple Vitamin (MULTIVITAMIN) capsule Take 1 capsule by mouth daily.    . NONFORMULARY OR COMPOUNDED ITEM Apply 1 application topically as needed (Relaxing leg cream: Gnaphalium & polycephalum causticum).  Marland Kitchen omega-3 acid ethyl esters (LOVAZA) 1 g capsule Take 2 capsules (2 g total) by mouth 2 (two) times daily.  . polyethylene glycol (MIRALAX / GLYCOLAX) packet Take 17 g by mouth as needed.    . rosuvastatin (CRESTOR) 20 MG tablet Take 1 tablet (20 mg total) by mouth daily.     Past Surgical History:  Procedure Laterality Date  . CARPAL TUNNEL RELEASE    . HEMORRHOID SURGERY    . HEMORROIDECTOMY    . TOTAL ABDOMINAL HYSTERECTOMY      Family History  Problem Relation Age of Onset  . Coronary artery disease Father 2  . Neuropathy Father   . Diabetes Father   . Hypertension Father   . Heart attack Father   . Dementia Mother   . Healthy Sister   . COPD Brother   . COPD Brother   . COPD Brother   . Diabetes Sister   . Cancer Son  oral    New complaints: None today  Social history: Lives alone- family checks on her daily  Controlled substance contract: N/A    Review of Systems  Constitutional: Negative for diaphoresis and weight loss.  Eyes: Negative for blurred vision, double vision and pain.  Respiratory: Negative for shortness of breath.   Cardiovascular: Negative for chest pain, palpitations, orthopnea and leg swelling.  Gastrointestinal: Negative for abdominal pain.  Musculoskeletal: Positive for back pain (chronic).  Skin: Negative for rash.  Neurological: Negative for dizziness, sensory change, loss of consciousness, weakness and headaches.  Endo/Heme/Allergies: Negative for  polydipsia. Does not bruise/bleed easily.  Psychiatric/Behavioral: Negative for memory loss. The patient does not have insomnia.   All other systems reviewed and are negative.    Observations/Objective: Alert and oriented- answers all questions appropriately No distress  Assessment and Plan: Wendy Robles comes in today with chief complaint of Medical Management of Chronic Issues   Diagnosis and orders addressed:  1. Hyperlipidemia with target LDL less than 100 Low fat diet - omega-3 acid ethyl esters (LOVAZA) 1 g capsule; Take 2 capsules (2 g total) by mouth 2 (two) times daily.  Dispense: 360 capsule; Refill: 1 - rosuvastatin (CRESTOR) 20 MG tablet; Take 1 tablet (20 mg total) by mouth daily.  Dispense: 90 tablet; Refill: 1  2. Hereditary and idiopathic peripheral neuropathy Do not go barefooted  3. Osteopenia, unspecified location Weight bearing exercise encourgaed encourgaed daily vitamin d and calcium supplement  4. Primary insomnia Bedtime routine  5. BMI 29.0-29.9,adult Discussed diet and exercise for person with BMI >25 Will recheck weight in 3-6 months  6. Essential hypertension Blood pressure is good Not taking HCTZ right now   Previous labs reviewed Health Maintenance reviewed Diet and exercise encouraged  Follow up plan: 3 months      I discussed the assessment and treatment plan with the patient. The patient was provided an opportunity to ask questions and all were answered. The patient agreed with the plan and demonstrated an understanding of the instructions.   The patient was advised to call back or seek an in-person evaluation if the symptoms worsen or if the condition fails to improve as anticipated.  The above assessment and management plan was discussed with the patient. The patient verbalized understanding of and has agreed to the management plan. Patient is aware to call the clinic if symptoms persist or worsen. Patient is aware when to  return to the clinic for a follow-up visit. Patient educated on when it is appropriate to go to the emergency department.   Time call ended:  10:55  I provided 15 minutes of non-face-to-face time during this encounter.    Mary-Margaret Hassell Done, FNP

## 2018-10-23 ENCOUNTER — Encounter: Payer: Self-pay | Admitting: Family

## 2018-10-23 ENCOUNTER — Ambulatory Visit (INDEPENDENT_AMBULATORY_CARE_PROVIDER_SITE_OTHER): Payer: PPO | Admitting: Family

## 2018-10-23 ENCOUNTER — Other Ambulatory Visit: Payer: Self-pay | Admitting: Nurse Practitioner

## 2018-10-23 DIAGNOSIS — R399 Unspecified symptoms and signs involving the genitourinary system: Secondary | ICD-10-CM

## 2018-10-23 DIAGNOSIS — J301 Allergic rhinitis due to pollen: Secondary | ICD-10-CM

## 2018-10-23 DIAGNOSIS — J069 Acute upper respiratory infection, unspecified: Secondary | ICD-10-CM

## 2018-10-23 MED ORDER — CEPHALEXIN 500 MG PO CAPS: 500.0000 mg | ORAL_CAPSULE | Freq: Two times a day (BID) | ORAL | 0 refills | Status: DC

## 2018-10-23 NOTE — Progress Notes (Signed)
   Virtual Visit via telephone Note Due to COVID-19 pandemic this visit was conducted virtually. This visit type was conducted due to national recommendations for restrictions regarding the COVID-19 Pandemic (e.g. social distancing, sheltering in place) in an effort to limit this patient's exposure and mitigate transmission in our community. All issues noted in this document were discussed and addressed.  A physical exam was not performed with this format.  I connected with Wendy Robles on 10/23/18 at 3:12 pm by telephone and verified that I am speaking with the correct person using two identifiers. Wendy Robles is currently located at home and no one is currently with her during visit. The provider, Evelina Dun, FNP is located in their office at time of visit.  I discussed the limitations, risks, security and privacy concerns of performing an evaluation and management service by telephone and the availability of in person appointments. I also discussed with the patient that there may be a patient responsible charge related to this service. The patient expressed understanding and agreed to proceed.   History and Present Illness:  Dysuria  This is a recurrent problem. The current episode started 1 to 4 weeks ago. The problem occurs every urination. The problem has been waxing and waning. The quality of the pain is described as burning. Associated symptoms include frequency, hesitancy, nausea and urgency. Pertinent negatives include no hematuria or vomiting. She has tried acetaminophen and increased fluids for the symptoms. The treatment provided mild relief.      Review of Systems  Gastrointestinal: Positive for nausea. Negative for vomiting.  Genitourinary: Positive for dysuria, frequency, hesitancy and urgency. Negative for hematuria.  All other systems reviewed and are negative.    Observations/Objective: No SOB or distress noted   Assessment and Plan: 1. UTI symptoms Force  fluids AZO over the counter X2 days RTO if symptoms worsen or do not  improve - cephALEXin (KEFLEX) 500 MG capsule; Take 1 capsule (500 mg total) by mouth 2 (two) times daily.  Dispense: 14 capsule; Refill: 0      I discussed the assessment and treatment plan with the patient. The patient was provided an opportunity to ask questions and all were answered. The patient agreed with the plan and demonstrated an understanding of the instructions.   The patient was advised to call back or seek an in-person evaluation if the symptoms worsen or if the condition fails to improve as anticipated.  The above assessment and management plan was discussed with the patient. The patient verbalized understanding of and has agreed to the management plan. Patient is aware to call the clinic if symptoms persist or worsen. Patient is aware when to return to the clinic for a follow-up visit. Patient educated on when it is appropriate to go to the emergency department.   Time call ended:  3:20 pm  I provided 8 minutes of non-face-to-face time during this encounter.    Evelina Dun, FNP

## 2018-11-09 ENCOUNTER — Other Ambulatory Visit: Payer: Self-pay

## 2018-11-09 ENCOUNTER — Encounter: Payer: Self-pay | Admitting: Family

## 2018-11-09 ENCOUNTER — Ambulatory Visit (INDEPENDENT_AMBULATORY_CARE_PROVIDER_SITE_OTHER): Payer: PPO | Admitting: Family

## 2018-11-09 DIAGNOSIS — R399 Unspecified symptoms and signs involving the genitourinary system: Secondary | ICD-10-CM

## 2018-11-09 LAB — URINALYSIS, COMPLETE
Bilirubin, UA: NEGATIVE
Glucose, UA: NEGATIVE
Ketones, UA: NEGATIVE
Nitrite, UA: NEGATIVE
Protein,UA: NEGATIVE
Specific Gravity, UA: 1.015 (ref 1.005–1.030)
Urobilinogen, Ur: 0.2 mg/dL (ref 0.2–1.0)
pH, UA: 7 (ref 5.0–7.5)

## 2018-11-09 LAB — MICROSCOPIC EXAMINATION: WBC, UA: >30 /hpf — AB (ref 0–5)

## 2018-11-09 NOTE — Progress Notes (Signed)
   Virtual Visit via telephone Note Due to COVID-19 pandemic this visit was conducted virtually. This visit type was conducted due to national recommendations for restrictions regarding the COVID-19 Pandemic (e.g. social distancing, sheltering in place) in an effort to limit this patient's exposure and mitigate transmission in our community. All issues noted in this document were discussed and addressed.  A physical exam was not performed with this format.  I connected with Wendy Robles on 11/09/18 at 9:57 AM by telephone and verified that I am speaking with the correct person using two identifiers. Wendy Robles is currently located at home and no one is currently with her during visit. The provider, Evelina Dun, FNP is located in their office at time of visit.  I discussed the limitations, risks, security and privacy concerns of performing an evaluation and management service by telephone and the availability of in person appointments. I also discussed with the patient that there may be a patient responsible charge related to this service. The patient expressed understanding and agreed to proceed.   History and Present Illness:  Pt calls the office today for recurrent UTI. She called the office 10/23/18 and started on Keflex. She reports her symptoms had improved, but worsen after three days after completing it.  Urinary Tract Infection  This is a recurrent problem. The current episode started 1 to 4 weeks ago. The problem has been gradually worsening. The quality of the pain is described as burning. The pain is at a severity of 6/10. The pain is mild. There has been no fever. Associated symptoms include frequency, hematuria and urgency. Pertinent negatives include no hesitancy, nausea or vomiting. Associated symptoms comments: Foul urine . She has tried antibiotics for the symptoms. The treatment provided mild relief.      Review of Systems  Gastrointestinal: Negative for nausea and  vomiting.  Genitourinary: Positive for frequency, hematuria and urgency. Negative for hesitancy.     Observations/Objective: No SOB or distress noted   Assessment and Plan: 1. UTI symptoms Pt will bring in a urine today before we give antibiotics. She has had 5 different UTI's this year. Her last culture was 06/15/18 that was negative. If your urine culture is negative we will do Urologists referral. Force fluids Call office if symptom worsen or do not improve  - Urinalysis, Complete - Urine Culture     I discussed the assessment and treatment plan with the patient. The patient was provided an opportunity to ask questions and all were answered. The patient agreed with the plan and demonstrated an understanding of the instructions.   The patient was advised to call back or seek an in-person evaluation if the symptoms worsen or if the condition fails to improve as anticipated.  The above assessment and management plan was discussed with the patient. The patient verbalized understanding of and has agreed to the management plan. Patient is aware to call the clinic if symptoms persist or worsen. Patient is aware when to return to the clinic for a follow-up visit. Patient educated on when it is appropriate to go to the emergency department.   Time call ended: 10:10 AM   I provided 13 minutes of non-face-to-face time during this encounter.    Evelina Dun, FNP

## 2018-11-11 LAB — URINE CULTURE

## 2018-11-12 ENCOUNTER — Other Ambulatory Visit: Payer: Self-pay | Admitting: Family

## 2018-11-12 MED ORDER — AMOXICILLIN-POT CLAVULANATE 875-125 MG PO TABS: 1.0000 | ORAL_TABLET | Freq: Two times a day (BID) | ORAL | 0 refills | Status: AC

## 2018-11-22 ENCOUNTER — Other Ambulatory Visit: Payer: Self-pay

## 2018-11-23 ENCOUNTER — Ambulatory Visit (INDEPENDENT_AMBULATORY_CARE_PROVIDER_SITE_OTHER): Payer: PPO | Admitting: Nurse Practitioner

## 2018-11-23 ENCOUNTER — Encounter: Payer: Self-pay | Admitting: Nurse Practitioner

## 2018-11-23 VITALS — BP 136/84 | HR 79 | Temp 98.0°F | Resp 20 | Ht 64.0 in | Wt 193.0 lb

## 2018-11-23 DIAGNOSIS — F5101 Primary insomnia: Secondary | ICD-10-CM

## 2018-11-23 DIAGNOSIS — G609 Hereditary and idiopathic neuropathy, unspecified: Secondary | ICD-10-CM

## 2018-11-23 DIAGNOSIS — I10 Essential (primary) hypertension: Secondary | ICD-10-CM

## 2018-11-23 DIAGNOSIS — J301 Allergic rhinitis due to pollen: Secondary | ICD-10-CM | POA: Diagnosis not present

## 2018-11-23 DIAGNOSIS — E785 Hyperlipidemia, unspecified: Secondary | ICD-10-CM | POA: Diagnosis not present

## 2018-11-23 DIAGNOSIS — M8580 Other specified disorders of bone density and structure, unspecified site: Secondary | ICD-10-CM | POA: Diagnosis not present

## 2018-11-23 DIAGNOSIS — Z6832 Body mass index (BMI) 32.0-32.9, adult: Secondary | ICD-10-CM

## 2018-11-23 MED ORDER — OMEGA-3-ACID ETHYL ESTERS 1 G PO CAPS: 2.0000 | ORAL_CAPSULE | Freq: Two times a day (BID) | ORAL | 1 refills | Status: DC

## 2018-11-23 MED ORDER — ROSUVASTATIN CALCIUM 20 MG PO TABS: 20.0000 mg | ORAL_TABLET | Freq: Every day | ORAL | 1 refills | Status: DC

## 2018-11-23 MED ORDER — HYDROCHLOROTHIAZIDE 25 MG PO TABS: 25.0000 mg | ORAL_TABLET | Freq: Every day | ORAL | 3 refills | Status: DC

## 2018-11-23 MED ORDER — FLUTICASONE PROPIONATE 50 MCG/ACT NA SUSP: 2.0000 | Freq: Every day | NASAL | 5 refills | Status: DC

## 2018-11-23 NOTE — Progress Notes (Signed)
 Subjective:    Patient ID: Wendy Robles, female    DOB: 11/12/1929, 85 y.o.   MRN: [id number]   Chief Complaint: medical management if chronic issues   HPI:  1. Hyperlipidemia with target LDL less than 100 she does try to watch her diet. Does some exercise. Takes crestor daily. Lab Results  Component Value Date   CHOL 126 06/15/2018   HDL 39 (L) 06/15/2018   LDLCALC 52 06/15/2018   TRIG 175 (H) 06/15/2018   CHOLHDL 3.2 06/15/2018     2. Hereditary and idiopathic peripheral neuropathy Burning os in bil feet. She uses OTC cream sthat seems to help. She denies any numbness or tingling of feet  3. Osteopenia, unspecified location Last dexascan was done 10/12/17 with t score of -0.3. She has aged out of having anymore  4. Primary insomnia She has been sleeping really well.  5. Hypertension No c/o chest pain, sob or headache. She checks hr blood pressure in th e mornings and iyt is always elevated when it is time to take her HCTZ>  6. BMI 32.0-32.9,adult No recent weight changes. Wt Readings from Last 3 Encounters:  02/13/18 188 lb (85.3 kg)  10/12/17 184 lb (83.5 kg)  09/13/17 185 lb (83.9 kg)   BMI Readings from Last 3 Encounters:  02/13/18 32.27 kg/m  10/12/17 31.58 kg/m  09/13/17 31.76 kg/m       Outpatient Encounter Medications as of 11/23/2018  Medication Sig  . acetaminophen (TYLENOL) 650 MG CR tablet Take 650 mg by mouth every 8 (eight) hours as needed for pain.  . aspirin 81 MG tablet Take 81 mg by mouth daily.    . Cholecalciferol (VITAMIN D) 2000 units CAPS Take 1 capsule 2 (two) times daily by mouth.   . Diclofenac Sodium 1.5 % SOLN 2g to affected area daily  . EQ ALLERGY RELIEF, CETIRIZINE, 10 MG tablet Take 1/2 (one-half) tablet by mouth once daily  . fluticasone (FLONASE) 50 MCG/ACT nasal spray Place 2 sprays into both nostrils daily.  . hydrochlorothiazide (HYDRODIURIL) 25 MG tablet Take 1 tablet (25 mg total) by mouth daily.  . Multiple  Vitamin (MULTIVITAMIN) capsule Take 1 capsule by mouth daily.    . NONFORMULARY OR COMPOUNDED ITEM Apply 1 application topically as needed (Relaxing leg cream: Gnaphalium & polycephalum causticum).  . omega-3 acid ethyl esters (LOVAZA) 1 g capsule Take 2 capsules (2 g total) by mouth 2 (two) times daily.  . polyethylene glycol (MIRALAX / GLYCOLAX) packet Take 17 g by mouth as needed.    . rosuvastatin (CRESTOR) 20 MG tablet Take 1 tablet (20 mg total) by mouth daily.     Past Surgical History:  Procedure Laterality Date  . CARPAL TUNNEL RELEASE    . HEMORRHOID SURGERY    . HEMORROIDECTOMY    . TOTAL ABDOMINAL HYSTERECTOMY      Family History  Problem Relation Age of Onset  . Coronary artery disease Father 82  . Neuropathy Father   . Diabetes Father   . Hypertension Father   . Heart attack Father   . Dementia Mother   . Healthy Sister   . COPD Brother   . COPD Brother   . COPD Brother   . Diabetes Sister   . Cancer Son        oral    New complaints: uti seems to have resolved  Social history: Lives by herself  Controlled substance contract: n/a     Review of Systems  Constitutional:   Negative for activity change and appetite change.  HENT: Negative.   Eyes: Negative for pain.  Respiratory: Negative for shortness of breath.   Cardiovascular: Negative for chest pain, palpitations and leg swelling.  Gastrointestinal: Negative for abdominal pain.  Endocrine: Negative for polydipsia.  Genitourinary: Negative.   Skin: Negative for rash.  Neurological: Negative for dizziness, weakness and headaches.  Hematological: Does not bruise/bleed easily.  Psychiatric/Behavioral: Negative.   All other systems reviewed and are negative.      Objective:   Physical Exam Vitals signs and nursing note reviewed.  Constitutional:      General: She is not in acute distress.    Appearance: Normal appearance. She is well-developed.  HENT:     Head: Normocephalic.     Nose: Nose  normal.  Eyes:     Pupils: Pupils are equal, round, and reactive to light.  Neck:     Musculoskeletal: Normal range of motion and neck supple.     Vascular: No carotid bruit or JVD.  Cardiovascular:     Rate and Rhythm: Normal rate and regular rhythm.     Heart sounds: Normal heart sounds.  Pulmonary:     Effort: Pulmonary effort is normal. No respiratory distress.     Breath sounds: Normal breath sounds. No wheezing or rales.  Chest:     Chest wall: No tenderness.  Abdominal:     General: Bowel sounds are normal. There is no distension or abdominal bruit.     Palpations: Abdomen is soft. There is no hepatomegaly, splenomegaly, mass or pulsatile mass.     Tenderness: There is no abdominal tenderness.  Musculoskeletal: Normal range of motion.  Lymphadenopathy:     Cervical: No cervical adenopathy.  Skin:    General: Skin is warm and dry.  Neurological:     Mental Status: She is alert and oriented to person, place, and time.     Deep Tendon Reflexes: Reflexes are normal and symmetric.  Psychiatric:        Behavior: Behavior normal.        Thought Content: Thought content normal.        Judgment: Judgment normal.     BP 136/84 (BP Location: Left Arm, Cuff Size: Normal)   Pulse 79   Temp 98 F (36.7 C) (Temporal)   Resp 20   Ht 5' 4" (1.626 m)   Wt 193 lb (87.5 kg)   SpO2 96%   BMI 33.13 kg/m         Assessment & Plan:  Wendy Robles comes in today with chief complaint of Medical Management of Chronic Issues   Diagnosis and orders addressed:  1. Hyperlipidemia with target LDL less than 100 Low fat diet - rosuvastatin (CRESTOR) 20 MG tablet; Take 1 tablet (20 mg total) by mouth daily.  Dispense: 90 tablet; Refill: 1 - omega-3 acid ethyl esters (LOVAZA) 1 g capsule; Take 2 capsules (2 g total) by mouth 2 (two) times daily.  Dispense: 360 capsule; Refill: 1 - Lipid panel  2. Essential hypertension Low sodium diet - hydrochlorothiazide (HYDRODIURIL) 25 MG  tablet; Take 1 tablet (25 mg total) by mouth daily.  Dispense: 90 tablet; Refill: 3 - CMP14+EGFR  3. Hereditary and idiopathic peripheral neuropathy Do not go barefooted  4. Osteopenia, unspecified location Weight bearing exercise when can tolerate  5. Primary insomnia Bedtime routine  6. BMI 32.0-32.9,adult Discussed diet and exercise for person with BMI >25 Will recheck weight in 3-6 months  7. Allergic rhinitis   due to pollen, unspecified seasonality - fluticasone (FLONASE) 50 MCG/ACT nasal spray; Place 2 sprays into both nostrils daily.  Dispense: 16 g; Refill: 5   Labs pending Health Maintenance reviewed Diet and exercise encouraged  Follow up plan: 6 months   Mary-Margaret Hassell Done, FNP

## 2018-11-23 NOTE — Patient Instructions (Signed)

## 2018-11-24 LAB — CMP14+EGFR
ALT: 27 IU/L (ref 0–32)
AST: 27 IU/L (ref 0–40)
Albumin/Globulin Ratio: 1.8 (ref 1.2–2.2)
Albumin: 4.6 g/dL (ref 3.6–4.6)
Alkaline Phosphatase: 53 IU/L (ref 39–117)
BUN/Creatinine Ratio: 21 (ref 12–28)
BUN: 14 mg/dL (ref 8–27)
Bilirubin Total: 0.4 mg/dL (ref 0.0–1.2)
CO2: 26 mmol/L (ref 20–29)
Calcium: 10 mg/dL (ref 8.7–10.3)
Chloride: 103 mmol/L (ref 96–106)
Creatinine, Ser: 0.66 mg/dL (ref 0.57–1.00)
GFR calc Af Amer: 92 mL/min/{1.73_m2} (ref >59)
GFR calc non Af Amer: 80 mL/min/{1.73_m2} (ref >59)
Globulin, Total: 2.6 g/dL (ref 1.5–4.5)
Glucose: 96 mg/dL (ref 65–99)
Potassium: 4.4 mmol/L (ref 3.5–5.2)
Sodium: 140 mmol/L (ref 134–144)
Total Protein: 7.2 g/dL (ref 6.0–8.5)

## 2018-11-24 LAB — LIPID PANEL
Chol/HDL Ratio: 3.5 ratio (ref 0.0–4.4)
Cholesterol, Total: 133 mg/dL (ref 100–199)
HDL: 38 mg/dL — ABNORMAL LOW (ref >39)
LDL Chol Calc (NIH): 65 mg/dL (ref 0–99)
Triglycerides: 181 mg/dL — ABNORMAL HIGH (ref 0–149)
VLDL Cholesterol Cal: 30 mg/dL (ref 5–40)

## 2019-03-06 DIAGNOSIS — Z23 Encounter for immunization: Secondary | ICD-10-CM | POA: Diagnosis not present

## 2019-04-17 ENCOUNTER — Telehealth: Payer: Self-pay | Admitting: Nurse Practitioner

## 2019-04-17 NOTE — Chronic Care Management (AMB) (Signed)
  Chronic Care Management   Note  04/17/2019 Name: SERENIDY WALTZ MRN: [id number] DOB: 15-Feb-1929  Wendy Robles is a 86 y.o. year old female who is a primary care patient of Chevis Pretty, [place]. I reached out to [name] by phone today in response to a referral sent by Ms. Nicki Reaper Delaluz's health plan.     Ms. Lemere was given information about Chronic Care Management services today including:  1. CCM service includes personalized support from designated clinical staff supervised by her physician, including individualized plan of care and coordination with other care providers 2. 24/7 contact phone numbers for assistance for urgent and routine care needs. 3. Service will only be billed when office clinical staff spend 20 minutes or more in a month to coordinate care. 4. Only one practitioner may furnish and bill the service in a calendar month. 5. The patient may stop CCM services at any time (effective at the end of the month) by phone call to the office staff. 6. The patient will be responsible for cost sharing (co-pay) of up to 20% of the service fee (after annual deductible is met).  Patient did not agree to enrollment in care management services and does not wish to consider at this time.  Follow up plan: The care management team is available to follow up with the patient after provider conversation with the patient regarding recommendation for care management engagement and subsequent re-referral to the care management team.   [address], [place], [place] [address] Direct Dial: [email or web address] Website: [email or web address]

## 2019-04-29 ENCOUNTER — Ambulatory Visit (INDEPENDENT_AMBULATORY_CARE_PROVIDER_SITE_OTHER): Payer: PPO

## 2019-04-29 DIAGNOSIS — Z0000 Encounter for general adult medical examination without abnormal findings: Secondary | ICD-10-CM

## 2019-04-29 NOTE — Progress Notes (Signed)
MEDICARE ANNUAL WELLNESS VISIT  04/29/2019  Telephone Visit Disclaimer This Medicare AWV was conducted by telephone due to national recommendations for restrictions regarding the COVID-19 Pandemic (e.g. social distancing).  I verified, using two identifiers, that I am speaking with Wendy Robles or their authorized healthcare agent. I discussed the limitations, risks, security, and privacy concerns of performing an evaluation and management service by telephone and the potential availability of an in-person appointment in the future. The patient expressed understanding and agreed to proceed.   Subjective:  Wendy Robles is a 86 y.o. female patient of Chevis Pretty, [place] who had a Medicare Annual Wellness Visit today via telephone. Dyemond is Retired from [employer] and working as a Building control surveyor for 20 years. She lives alone since her husband passed away.. she has 2 children, a son and a daughter but her son passed away in 2014-05-14 from oral cancer.. she reports that she is socially active and does interact with friends/family regularly. she is minimally physically active and enjoys [employer] and when she is able shopping at [place].   Patient Care Team: Chevis Pretty, FNP as PCP - General (Nurse Practitioner) Melina Schools, OD (Optometry)  Advanced Directives 04/29/2019  Does Patient Have a Medical Advance Directive? Yes  Type of Advance Directive Living will    Hospital Utilization Over the Past 12 Months: # of hospitalizations or ER visits: 0 # of surgeries: 0  Review of Systems    Patient reports that her overall health is unchanged compared to last year.  History obtained from chart review and patient  Patient Reported Readings (BP, Pulse, CBG, Weight, etc) none  Pain Assessment Pain : 0-10 Pain Score: 5  Pain Type: Chronic pain Pain Location: Foot Pain Orientation: Other (Comment) Pain Descriptors / Indicators: Constant Pain Onset: More than a month  ago Pain Frequency: Intermittent     Current Medications & Allergies (verified) Allergies as of 04/29/2019      Reactions   Neurontin [gabapentin]    Bactrim [sulfamethoxazole-trimethoprim] Rash      Medication List       Accurate as of April 29, 2019 11:17 AM. If you have any questions, ask your nurse or doctor.        acetaminophen 650 MG CR tablet Commonly known as: TYLENOL Take 650 mg by mouth every 8 (eight) hours as needed for pain.   aspirin 81 MG tablet Take 81 mg by mouth daily.   Diclofenac Sodium 1.5 % Soln 2g to affected area daily   EQ Allergy Relief (Cetirizine) 10 MG tablet Generic drug: cetirizine Take 1/2 (one-half) tablet by mouth once daily   fluticasone 50 MCG/ACT nasal spray Commonly known as: FLONASE Place 2 sprays into both nostrils daily.   hydrochlorothiazide 25 MG tablet Commonly known as: HYDRODIURIL Take 1 tablet (25 mg total) by mouth daily.   multivitamin capsule Take 1 capsule by mouth daily.   NONFORMULARY OR COMPOUNDED ITEM Apply 1 application topically as needed (Relaxing leg cream: Gnaphalium & polycephalum causticum).   omega-3 acid ethyl esters 1 g capsule Commonly known as: LOVAZA Take 2 capsules (2 g total) by mouth 2 (two) times daily.   polyethylene glycol 17 g packet Commonly known as: MIRALAX / GLYCOLAX Take 17 g by mouth as needed.   rosuvastatin 20 MG tablet Commonly known as: Crestor Take 1 tablet (20 mg total) by mouth daily.   Vitamin D 50 MCG (2000 UT) Caps Take 1 capsule 2 (two) times daily by  mouth.       History (reviewed): Past Medical History:  Diagnosis Date  . Arthritis   . Colon polyps   . Diverticulosis   . Dyslipidemia   . HTN (hypertension)    White coat  . Hyperlipidemia   . Osteoporosis    Past Surgical History:  Procedure Laterality Date  . CARPAL TUNNEL RELEASE    . HEMORRHOID SURGERY    . HEMORROIDECTOMY    . TOTAL ABDOMINAL HYSTERECTOMY     Family History  Problem  Relation Age of Onset  . Coronary artery disease Father 29  . Neuropathy Father   . Diabetes Father   . Hypertension Father   . Heart attack Father   . Dementia Mother   . Healthy Sister   . COPD Brother   . COPD Brother   . COPD Brother   . Diabetes Sister   . Cancer Son        oral   Social History   Socioeconomic History  . Marital status: Widowed    Spouse name: Not on file  . Number of children: 2  . Years of education: 7  . Highest education level: 7th grade  Occupational History    Employer: RETIRED  Tobacco Use  . Smoking status: Never Smoker  . Smokeless tobacco: Never Used  Substance and Sexual Activity  . Alcohol use: No  . Drug use: No  . Sexual activity: Not Currently  Other Topics Concern  . Not on file  Social History Narrative  . Not on file   Social Determinants of Health   Financial Resource Strain:   . Difficulty of Paying Living Expenses:   Food Insecurity:   . Worried About Charity fundraiser in the Last Year:   . Arboriculturist in the Last Year:   Transportation Needs:   . Film/video editor (Medical):   Marland Kitchen Lack of Transportation (Non-Medical):   Physical Activity:   . Days of Exercise per Week:   . Minutes of Exercise per Session:   Stress:   . Feeling of Stress :   Social Connections:   . Frequency of Communication with Friends and Family:   . Frequency of Social Gatherings with Friends and Family:   . Attends Religious Services:   . Active Member of Clubs or Organizations:   . Attends Archivist Meetings:   Marland Kitchen Marital Status:     Activities of Daily Living In your present state of health, do you have any difficulty performing the following activities: 04/29/2019  Hearing? N  Comment Has slight hearing problem  Vision? Y  Comment Wears glasses all the time  Difficulty concentrating or making decisions? N  Walking or climbing stairs? Y  Dressing or bathing? N  Doing errands, shopping? N  Preparing Food and  eating ? N  Using the Toilet? N  In the past six months, have you accidently leaked urine? N  Do you have problems with loss of bowel control? N  Managing your Medications? N  Managing your Finances? N  Housekeeping or managing your Housekeeping? Y  Some recent data might be hidden    Patient Education/ Literacy How often do you need to have someone help you when you read instructions, pamphlets, or other written materials from your doctor or pharmacy?: 1 - Never What is the last grade level you completed in school?: 7th grade  Exercise Current Exercise Habits: The patient does not participate in regular exercise at present,  Exercise limited by: orthopedic condition(s)  Diet Patient reports consuming 3 meals a day and 2 snack(s) a day Patient reports that her primary diet is: Regular Patient reports that she does have regular access to food.   Depression Screen PHQ 2/9 Scores 11/23/2018 08/21/2018 05/17/2018 02/13/2018 10/12/2017 09/13/2017 08/15/2017  PHQ - 2 Score 0 0 0 0 0 0 0     Fall Risk Fall Risk  04/29/2019 11/23/2018 11/23/2018 08/21/2018 02/13/2018  Falls in the past year? 0 0 0 0 0  Comment - - - - -  Number falls in past yr: - - - - -  Injury with Fall? - - - - -  Risk Factor Category  - - - - -  Risk for fall due to : - - - - -     Objective:  Wendy Robles seemed alert and oriented and she participated appropriately during our telephone visit.  Blood Pressure Weight BMI  BP Readings from Last 3 Encounters:  11/23/18 136/84  02/13/18 (!) 162/96  10/12/17 (!) 142/82   Wt Readings from Last 3 Encounters:  11/23/18 193 lb (87.5 kg)  02/13/18 188 lb (85.3 kg)  10/12/17 184 lb (83.5 kg)   BMI Readings from Last 1 Encounters:  11/23/18 33.13 kg/m    *Unable to obtain current vital signs, weight, and BMI due to telephone visit type  Hearing/Vision  . Kenlee did not seem to have difficulty with hearing/understanding during the telephone conversation . Reports  that she has had a formal eye exam by an eye care professional within the past year . Reports that she has not had a formal hearing evaluation within the past year *Unable to fully assess hearing and vision during telephone visit type  Patient states that she had a formal hearing exam in the past but did not want to purchase the hearing aids due to high cost. Patient heard me well during phone call today Cognitive Function: 6CIT Screen 04/29/2019  What Year? 0 points  What month? 0 points  What time? 0 points  Count back from 20 0 points  Months in reverse 0 points  Repeat phrase 0 points  Total Score 0   (Normal:0-7, Significant for Dysfunction: >8)  Normal Cognitive Function Screening: Yes Patient had no problem with cognitive screening  Immunization & Health Maintenance Record Immunization History  Administered Date(s) Administered  . Influenza, High Dose Seasonal PF 11/02/2015, 11/08/2016, 12/05/2017  . Influenza,inj,Quad PF,6+ Mos 11/04/2013, 12/08/2014  . Influenza-Unspecified 10/31/2012, 12/11/2018  . Pneumococcal Conjugate-13 07/24/2013  . Pneumococcal Polysaccharide-23 08/14/2014    Health Maintenance  Topic Date Due  . TETANUS/TDAP  11/23/2019 (Originally 05/15/1948)  . INFLUENZA VACCINE  Completed  . DEXA SCAN  Completed  . PNA vac Low Risk Adult  Completed  . MAMMOGRAM  Discontinued       Assessment  This is a routine wellness examination for Wendy Robles.  Health Maintenance: Due or Overdue There are no preventive care reminders to display for this patient.  Wendy Robles does not need a referral for Community Assistance: Care Management:   no Social Work:    no Prescription Assistance:  no Nutrition/Diabetes Education:  no   Plan:  Personalized Goals Goals Addressed            This Visit's Progress   . DIET - EAT MORE FRUITS AND VEGETABLES        Personalized Health Maintenance & Screening Recommendations    Lung Cancer Screening  Recommended:  no (Low Dose CT Chest recommended if Age 24-80 years, 30 pack-year currently smoking OR have quit w/in past 15 years) Hepatitis C Screening recommended: not applicable HIV Screening recommended: no  Advanced Directives: Written information was not prepared per patient's request.  Referrals & Orders No orders of the defined types were placed in this encounter.   Follow-up Plan . Follow-up with Chevis Pretty, FNP as planned . Patient is scheduled for an eye exam in May of 2021    I have personally reviewed and noted the following in the patient's chart:   . Medical and social history . Use of alcohol, tobacco or illicit drugs  . Current medications and supplements . Functional ability and status . Nutritional status . Physical activity . Advanced directives . List of other physicians . Hospitalizations, surgeries, and ER visits in previous 12 months . Vitals . Screenings to include cognitive, depression, and falls . Referrals and appointments  In addition, I have reviewed and discussed with Wendy Robles certain preventive protocols, quality metrics, and best practice recommendations. A written personalized care plan for preventive services as well as general preventive health recommendations is available and can be mailed to the patient at her request.      Rolena Infante LPN [id number]

## 2019-05-27 ENCOUNTER — Other Ambulatory Visit: Payer: Self-pay

## 2019-05-27 ENCOUNTER — Encounter: Payer: Self-pay | Admitting: Nurse Practitioner

## 2019-05-27 ENCOUNTER — Ambulatory Visit (INDEPENDENT_AMBULATORY_CARE_PROVIDER_SITE_OTHER): Payer: PPO | Admitting: Nurse Practitioner

## 2019-05-27 VITALS — BP 152/84 | HR 78 | Temp 98.2°F | Ht 64.0 in | Wt 195.2 lb

## 2019-05-27 DIAGNOSIS — G609 Hereditary and idiopathic neuropathy, unspecified: Secondary | ICD-10-CM

## 2019-05-27 DIAGNOSIS — E785 Hyperlipidemia, unspecified: Secondary | ICD-10-CM

## 2019-05-27 DIAGNOSIS — M8588 Other specified disorders of bone density and structure, other site: Secondary | ICD-10-CM | POA: Diagnosis not present

## 2019-05-27 DIAGNOSIS — Z6829 Body mass index (BMI) 29.0-29.9, adult: Secondary | ICD-10-CM

## 2019-05-27 DIAGNOSIS — F5101 Primary insomnia: Secondary | ICD-10-CM

## 2019-05-27 DIAGNOSIS — I10 Essential (primary) hypertension: Secondary | ICD-10-CM | POA: Diagnosis not present

## 2019-05-27 MED ORDER — ROSUVASTATIN CALCIUM 20 MG PO TABS: 20.0000 mg | ORAL_TABLET | Freq: Every day | ORAL | 1 refills | Status: DC

## 2019-05-27 MED ORDER — HYDROCHLOROTHIAZIDE 25 MG PO TABS: 25.0000 mg | ORAL_TABLET | Freq: Every day | ORAL | 1 refills | Status: DC

## 2019-05-27 NOTE — Progress Notes (Signed)
Subjective:    Patient ID: Wendy Robles, female    DOB: 1929-04-07, 86 y.o.   MRN: [id number]   Chief Complaint: Medical Management of Chronic Issues    HPI:  1. Hyperlipidemia with target LDL less than 100 Tries to watch diet , but is not able to do much exercise due to unsteady gait. Lab Results  Component Value Date   CHOL 133 11/23/2018   HDL 38 (L) 11/23/2018   LDLCALC 65 11/23/2018   TRIG 181 (H) 11/23/2018   CHOLHDL 3.5 11/23/2018   2. hypertension No c/o chest pain, sob or headache. Does  check blood pressure at homeat it runs from [id number] to 962 systolic. BP Readings from Last 3 Encounters:  11/23/18 136/84  02/13/18 (!) 162/96  10/12/17 (!) 142/82    2. Hereditary and idiopathic peripheral neuropathy Has neuropathy in bil feet. She tried taking neuropathy but could not tolerate. Uses diclofenac cream which helps  3. Osteopenia of lumbar spine Last bone density test was done 10/12/17 with t score of -0.3 which had improved and is considered normal. However we will keep check on this in future for changes since can no longer do weight bearing exercise  4. Primary insomnia She has been sleeping pretty well as lonag as her knees don't start hurting at night.  5. BMI 29.0-29.9,adult No recent weight changes Wt Readings from Last 3 Encounters:  11/23/18 193 lb (87.5 kg)  02/13/18 188 lb (85.3 kg)  10/12/17 184 lb (83.5 kg)   BMI Readings from Last 3 Encounters:  11/23/18 33.13 kg/m  02/13/18 32.27 kg/m  10/12/17 31.58 kg/m       Outpatient Encounter Medications as of 05/27/2019  Medication Sig  . acetaminophen (TYLENOL) 650 MG CR tablet Take 650 mg by mouth every 8 (eight) hours as needed for pain.  Marland Kitchen aspirin 81 MG tablet Take 81 mg by mouth daily.    . Cholecalciferol (VITAMIN D) 2000 units CAPS Take 1 capsule 2 (two) times daily by mouth.   . Diclofenac Sodium 1.5 % SOLN 2g to affected area daily  . EQ ALLERGY RELIEF, CETIRIZINE, 10 MG tablet  Take 1/2 (one-half) tablet by mouth once daily  . fluticasone (FLONASE) 50 MCG/ACT nasal spray Place 2 sprays into both nostrils daily.  . hydrochlorothiazide (HYDRODIURIL) 25 MG tablet Take 1 tablet (25 mg total) by mouth daily.  . Multiple Vitamin (MULTIVITAMIN) capsule Take 1 capsule by mouth daily.    . NONFORMULARY OR COMPOUNDED ITEM Apply 1 application topically as needed (Relaxing leg cream: Gnaphalium & polycephalum causticum).  Marland Kitchen omega-3 acid ethyl esters (LOVAZA) 1 g capsule Take 2 capsules (2 g total) by mouth 2 (two) times daily.  . polyethylene glycol (MIRALAX / GLYCOLAX) packet Take 17 g by mouth as needed.    . rosuvastatin (CRESTOR) 20 MG tablet Take 1 tablet (20 mg total) by mouth daily.     Past Surgical History:  Procedure Laterality Date  . CARPAL TUNNEL RELEASE    . HEMORRHOID SURGERY    . HEMORROIDECTOMY    . TOTAL ABDOMINAL HYSTERECTOMY      Family History  Problem Relation Age of Onset  . Coronary artery disease Father 69  . Neuropathy Father   . Diabetes Father   . Hypertension Father   . Heart attack Father   . Dementia Mother   . Healthy Sister   . COPD Brother   . COPD Brother   . COPD Brother   . Diabetes Sister   .  Cancer Son        oral    New complaints: None today  Social history: Lives by herself  Controlled substance contract: n/a    Review of Systems  Constitutional: Negative for diaphoresis.  Eyes: Negative for pain.  Respiratory: Negative for shortness of breath.   Cardiovascular: Negative for chest pain, palpitations and leg swelling.  Gastrointestinal: Negative for abdominal pain.  Endocrine: Negative for polydipsia.  Skin: Negative for rash.  Neurological: Negative for dizziness, weakness and headaches.  Hematological: Does not bruise/bleed easily.  All other systems reviewed and are negative.      Objective:   Physical Exam Vitals and nursing note reviewed.  Constitutional:      General: She is not in acute  distress.    Appearance: Normal appearance. She is well-developed.  HENT:     Head: Normocephalic.     Nose: Nose normal.  Eyes:     Pupils: Pupils are equal, round, and reactive to light.  Neck:     Vascular: No carotid bruit or JVD.  Cardiovascular:     Rate and Rhythm: Normal rate and regular rhythm.     Heart sounds: Normal heart sounds.  Pulmonary:     Effort: Pulmonary effort is normal. No respiratory distress.     Breath sounds: Normal breath sounds. No wheezing or rales.  Chest:     Chest wall: No tenderness.  Abdominal:     General: Bowel sounds are normal. There is no distension or abdominal bruit.     Palpations: Abdomen is soft. There is no hepatomegaly, splenomegaly, mass or pulsatile mass.     Tenderness: There is no abdominal tenderness.  Musculoskeletal:        General: Normal range of motion.     Cervical back: Normal range of motion and neck supple.  Lymphadenopathy:     Cervical: No cervical adenopathy.  Skin:    General: Skin is warm and dry.  Neurological:     Mental Status: She is alert and oriented to person, place, and time.     Deep Tendon Reflexes: Reflexes are normal and symmetric.  Psychiatric:        Behavior: Behavior normal.        Thought Content: Thought content normal.        Judgment: Judgment normal.    BP (!) 152/84 (BP Location: Left Arm, Cuff Size: Normal)   Pulse 78   Temp 98.2 F (36.8 C) (Temporal)   Ht '5\' 4"'$  (1.626 m)   Wt 195 lb 3.2 oz (88.5 kg)   BMI 33.51 kg/m          Assessment & Plan:  Wendy Robles comes in today with chief complaint of Medical Management of Chronic Issues   Diagnosis and orders addressed:  1. Hyperlipidemia with target LDL less than 100 Low fat diet - rosuvastatin (CRESTOR) 20 MG tablet; Take 1 tablet (20 mg total) by mouth daily.  Dispense: 90 tablet; Refill: 1 - Lipid panel  2. Essential hypertension Low sodium diet - hydrochlorothiazide (HYDRODIURIL) 25 MG tablet; Take 1 tablet (25  mg total) by mouth daily.  Dispense: 90 tablet; Refill: 1 - CBC with Differential/Platelet - CMP14+EGFR  3. Hereditary and idiopathic peripheral neuropathy Do not go barefooted Continue voltaren gel  4. Osteopenia of lumbar spine Will repeat dexascan in septmeber  5. Primary insomnia Bedtime routine  6. BMI 29.0-29.9,adult Discussed diet and exercise for person with BMI >25 Will recheck weight in 3-6 months  Labs pending Health Maintenance reviewed Diet and exercise encouraged  Follow up plan: 6 months   Mary-Margaret Hassell Done, FNP

## 2019-05-27 NOTE — Patient Instructions (Signed)
Neuropathic Pain Neuropathic pain is pain caused by damage to the nerves that are responsible for certain sensations in your body (sensory nerves). The pain can be caused by:  Damage to the sensory nerves that send signals to your spinal cord and brain (peripheral nervous system).  Damage to the sensory nerves in your brain or spinal cord (central nervous system). Neuropathic pain can make you more sensitive to pain. Even a minor sensation can feel very painful. This is usually a long-term condition that can be difficult to treat. The type of pain differs from person to person. It may:  Start suddenly (acute), or it may develop slowly and last for a long time (chronic).  Come and go as damaged nerves heal, or it may stay at the same level for years.  Cause emotional distress, loss of sleep, and a lower quality of life. What are the causes? The most common cause of this condition is diabetes. Many other diseases and conditions can also cause neuropathic pain. Causes of neuropathic pain can be classified as:  Toxic. This is caused by medicines and chemicals. The most common cause of toxic neuropathic pain is damage from cancer treatments (chemotherapy).  Metabolic. This can be caused by: ? Diabetes. This is the most common disease that damages the nerves. ? Lack of vitamin B from long-term alcohol abuse.  Traumatic. Any injury that cuts, crushes, or stretches a nerve can cause damage and pain. A common example is feeling pain after losing an arm or leg (phantom limb pain).  Compression-related. If a sensory nerve gets trapped or compressed for a long period of time, the blood supply to the nerve can be cut off.  Vascular. Many blood vessel diseases can cause neuropathic pain by decreasing blood supply and oxygen to nerves.  Autoimmune. This type of pain results from diseases in which the body's defense system (immune system) mistakenly attacks sensory nerves. Examples of autoimmune diseases  that can cause neuropathic pain include lupus and multiple sclerosis.  Infectious. Many types of viral infections can damage sensory nerves and cause pain. Shingles infection is a common cause of this type of pain.  Inherited. Neuropathic pain can be a symptom of many diseases that are passed down through families (genetic). What increases the risk? You are more likely to develop this condition if:  You have diabetes.  You smoke.  You drink too much alcohol.  You are taking certain medicines, including medicines that kill cancer cells (chemotherapy) or that treat immune system disorders. What are the signs or symptoms? The main symptom is pain. Neuropathic pain is often described as:  Burning.  Shock-like.  Stinging.  Hot or cold.  Itching. How is this diagnosed? No single test can diagnose neuropathic pain. It is diagnosed based on:  Physical exam and your symptoms. Your health care provider will ask you about your pain. You may be asked to use a pain scale to describe how bad your pain is.  Tests. These may be done to see if you have a high sensitivity to pain and to help find the cause and location of any sensory nerve damage. They include: ? Nerve conduction studies to test how well nerve signals travel through your sensory nerves (electrodiagnostic testing). ? Stimulating your sensory nerves through electrodes on your skin and measuring the response in your spinal cord and brain (somatosensory evoked potential).  Imaging studies, such as: ? X-rays. ? CT scan. ? MRI. How is this treated? Treatment for neuropathic pain may change   over time. You may need to try different treatment options or a combination of treatments. Some options include:  Treating the underlying cause of the neuropathy, such as diabetes, kidney disease, or vitamin deficiencies.  Stopping medicines that can cause neuropathy, such as chemotherapy.  Medicine to relieve pain. Medicines may  include: ? Prescription or over-the-counter pain medicine. ? Anti-seizure medicine. ? Antidepressant medicines. ? Pain-relieving patches that are applied to painful areas of skin. ? A medicine to numb the area (local anesthetic), which can be injected as a nerve block.  Transcutaneous nerve stimulation. This uses electrical currents to block painful nerve signals. The treatment is painless.  Alternative treatments, such as: ? Acupuncture. ? Meditation. ? Massage. ? Physical therapy. ? Pain management programs. ? Counseling. Follow these instructions at home: Medicines   Take over-the-counter and prescription medicines only as told by your health care provider.  Do not drive or use heavy machinery while taking prescription pain medicine.  If you are taking prescription pain medicine, take actions to prevent or treat constipation. Your health care provider may recommend that you: ? Drink enough fluid to keep your urine pale yellow. ? Eat foods that are high in fiber, such as fresh fruits and vegetables, whole grains, and beans. ? Limit foods that are high in fat and processed sugars, such as fried or sweet foods. ? Take an over-the-counter or prescription medicine for constipation. Lifestyle   Have a good support system at home.  Consider joining a chronic pain support group.  Do not use any products that contain nicotine or tobacco, such as cigarettes and e-cigarettes. If you need help quitting, ask your health care provider.  Do not drink alcohol. General instructions  Learn as much as you can about your condition.  Work closely with all your health care providers to find the treatment plan that works best for you.  Ask your health care provider what activities are safe for you.  Keep all follow-up visits as told by your health care provider. This is important. Contact a health care provider if:  Your pain treatments are not working.  You are having side effects  from your medicines.  You are struggling with tiredness (fatigue), mood changes, depression, or anxiety. Summary  Neuropathic pain is pain caused by damage to the nerves that are responsible for certain sensations in your body (sensory nerves).  Neuropathic pain may come and go as damaged nerves heal, or it may stay at the same level for years.  Neuropathic pain is usually a long-term condition that can be difficult to treat. Consider joining a chronic pain support group. This information is not intended to replace advice given to you by your health care provider. Make sure you discuss any questions you have with your health care provider. Document Revised: 05/10/2018 Document Reviewed: 02/03/2017 Elsevier Patient Education  2020 [employer]  

## 2019-05-28 LAB — CBC WITH DIFFERENTIAL/PLATELET
Basophils Absolute: 0.1 10*3/uL (ref 0.0–0.2)
Basos: 1 %
EOS (ABSOLUTE): 0.3 10*3/uL (ref 0.0–0.4)
Eos: 6 %
Hematocrit: 39.9 % (ref 34.0–46.6)
Hemoglobin: 13.4 g/dL (ref 11.1–15.9)
Immature Grans (Abs): 0 10*3/uL (ref 0.0–0.1)
Immature Granulocytes: 0 %
Lymphocytes Absolute: 1.8 10*3/uL (ref 0.7–3.1)
Lymphs: 36 %
MCH: 30.5 pg (ref 26.6–33.0)
MCHC: 33.6 g/dL (ref 31.5–35.7)
MCV: 91 fL (ref 79–97)
Monocytes Absolute: 0.5 10*3/uL (ref 0.1–0.9)
Monocytes: 11 %
Neutrophils Absolute: 2.3 10*3/uL (ref 1.4–7.0)
Neutrophils: 46 %
Platelets: 218 10*3/uL (ref 150–450)
RBC: 4.39 x10E6/uL (ref 3.77–5.28)
RDW: 12.4 % (ref 11.7–15.4)
WBC: 5.1 10*3/uL (ref 3.4–10.8)

## 2019-05-28 LAB — CMP14+EGFR
ALT: 25 IU/L (ref 0–32)
AST: 26 IU/L (ref 0–40)
Albumin/Globulin Ratio: 1.7 (ref 1.2–2.2)
Albumin: 4.6 g/dL (ref 3.6–4.6)
Alkaline Phosphatase: 49 IU/L (ref 39–117)
BUN/Creatinine Ratio: 22 (ref 12–28)
BUN: 14 mg/dL (ref 8–27)
Bilirubin Total: 0.4 mg/dL (ref 0.0–1.2)
CO2: 26 mmol/L (ref 20–29)
Calcium: 9.8 mg/dL (ref 8.7–10.3)
Chloride: 104 mmol/L (ref 96–106)
Creatinine, Ser: 0.65 mg/dL (ref 0.57–1.00)
GFR calc Af Amer: 92 mL/min/{1.73_m2} (ref >59)
GFR calc non Af Amer: 80 mL/min/{1.73_m2} (ref >59)
Globulin, Total: 2.7 g/dL (ref 1.5–4.5)
Glucose: 97 mg/dL (ref 65–99)
Potassium: 4.6 mmol/L (ref 3.5–5.2)
Sodium: 144 mmol/L (ref 134–144)
Total Protein: 7.3 g/dL (ref 6.0–8.5)

## 2019-05-28 LAB — LIPID PANEL
Chol/HDL Ratio: 3.4 ratio (ref 0.0–4.4)
Cholesterol, Total: 137 mg/dL (ref 100–199)
HDL: 40 mg/dL (ref >39)
LDL Chol Calc (NIH): 69 mg/dL (ref 0–99)
Triglycerides: 165 mg/dL — ABNORMAL HIGH (ref 0–149)
VLDL Cholesterol Cal: 28 mg/dL (ref 5–40)

## 2019-06-14 ENCOUNTER — Telehealth: Payer: Self-pay

## 2019-06-14 ENCOUNTER — Telehealth: Payer: Self-pay | Admitting: Nurse Practitioner

## 2019-06-14 DIAGNOSIS — E785 Hyperlipidemia, unspecified: Secondary | ICD-10-CM

## 2019-06-14 MED ORDER — OMEGA-3-ACID ETHYL ESTERS 1 G PO CAPS: 2.0000 | ORAL_CAPSULE | Freq: Two times a day (BID) | ORAL | 1 refills | Status: DC

## 2019-06-14 NOTE — Telephone Encounter (Signed)
Patient states that she has started taking the Aleve at night time and it is working well. Wants to know if she continues the aleve if she should stop her ASA 81. She doesn't want to make too much. Please advise. She is aware that you are out of the office until Tuesday

## 2019-06-14 NOTE — Telephone Encounter (Signed)
  Prescription Request  06/14/2019  What is the name of the medication or equipment? omega-3 acid ethyl esters (LOVAZA) 1 g capsule  Have you contacted your pharmacy to request a refill? (if applicable) yes need new rx. Pt knows that she has no more refills  Which pharmacy would you like this sent to? [facility]   Patient notified that their request is being sent to the clinical staff for review and that they should receive a response within 2 business days.

## 2019-06-14 NOTE — Telephone Encounter (Signed)
Medication sent to pharmacy and patient notified. 

## 2019-06-17 NOTE — Telephone Encounter (Signed)
Aware of provider's advice. 

## 2019-06-17 NOTE — Telephone Encounter (Signed)
No should be fine. Try to only take 1 aleve every other night. And he can trke 2 on the other nights if she needs them

## 2019-09-20 ENCOUNTER — Other Ambulatory Visit: Payer: Self-pay | Admitting: Nurse Practitioner

## 2019-09-20 DIAGNOSIS — J301 Allergic rhinitis due to pollen: Secondary | ICD-10-CM

## 2019-09-20 DIAGNOSIS — J069 Acute upper respiratory infection, unspecified: Secondary | ICD-10-CM

## 2019-11-26 ENCOUNTER — Ambulatory Visit: Payer: Self-pay | Admitting: Nurse Practitioner

## 2019-12-03 ENCOUNTER — Ambulatory Visit (INDEPENDENT_AMBULATORY_CARE_PROVIDER_SITE_OTHER): Payer: PPO | Admitting: Nurse Practitioner

## 2019-12-03 ENCOUNTER — Encounter: Payer: Self-pay | Admitting: Nurse Practitioner

## 2019-12-03 ENCOUNTER — Other Ambulatory Visit: Payer: Self-pay

## 2019-12-03 VITALS — BP 179/89 | HR 81 | Temp 98.4°F | Resp 20 | Ht 64.0 in | Wt 193.0 lb

## 2019-12-03 DIAGNOSIS — Z23 Encounter for immunization: Secondary | ICD-10-CM | POA: Diagnosis not present

## 2019-12-03 DIAGNOSIS — Z6829 Body mass index (BMI) 29.0-29.9, adult: Secondary | ICD-10-CM | POA: Diagnosis not present

## 2019-12-03 DIAGNOSIS — E785 Hyperlipidemia, unspecified: Secondary | ICD-10-CM | POA: Diagnosis not present

## 2019-12-03 DIAGNOSIS — I10 Essential (primary) hypertension: Secondary | ICD-10-CM | POA: Diagnosis not present

## 2019-12-03 DIAGNOSIS — G609 Hereditary and idiopathic neuropathy, unspecified: Secondary | ICD-10-CM | POA: Diagnosis not present

## 2019-12-03 DIAGNOSIS — M8588 Other specified disorders of bone density and structure, other site: Secondary | ICD-10-CM | POA: Diagnosis not present

## 2019-12-03 DIAGNOSIS — F5101 Primary insomnia: Secondary | ICD-10-CM | POA: Diagnosis not present

## 2019-12-03 MED ORDER — ROSUVASTATIN CALCIUM 20 MG PO TABS: 20.0000 mg | ORAL_TABLET | Freq: Every day | ORAL | 1 refills | Status: DC

## 2019-12-03 NOTE — Progress Notes (Signed)
Subjective:    Patient ID: Wendy Robles, female    DOB: 04/15/1929, 86 y.o.   MRN: [id number]   Chief Complaint: Medical Management of Chronic Issues    HPI:  1. Primary hypertension No c/o chest pain, sob or headache. Does check blood presure at home runs in 130's over 70's. BP Readings from Last 3 Encounters:  12/03/19 (!) 179/89  05/27/19 (!) 152/84  11/23/18 136/84      2. Hyperlipidemia with target LDL less than 100 Does try to watch diet. Does no dedicated exercise.she says she stays active though. Lab Results  Component Value Date   CHOL 137 05/27/2019   HDL 40 05/27/2019   LDLCALC 69 05/27/2019   TRIG 165 (H) 05/27/2019   CHOLHDL 3.4 05/27/2019     3. Hereditary and idiopathic peripheral neuropathy Feet burn all the time with slight numbness  4. Primary insomnia Sleeps well most nights. Takes tylenol at bedtime.  5. Osteopenia of lumbar spine Last dexascan was done 10/12/17. Her t score was -0.3 which ws normal at that time. She is on a daily vitamin d supplement  6. BMI 29.0-29.9,adult No recent weight changes Wt Readings from Last 3 Encounters:  12/03/19 193 lb (87.5 kg)  05/27/19 195 lb 3.2 oz (88.5 kg)  11/23/18 193 lb (87.5 kg)   BMI Readings from Last 3 Encounters:  12/03/19 33.13 kg/m  05/27/19 33.51 kg/m  11/23/18 33.13 kg/m       Outpatient Encounter Medications as of 12/03/2019  Medication Sig  . acetaminophen (TYLENOL) 650 MG CR tablet Take 650 mg by mouth every 8 (eight) hours as needed for pain.  Marland Kitchen aspirin 81 MG tablet Take 81 mg by mouth daily.    . Cholecalciferol (VITAMIN D) 2000 units CAPS Take 1 capsule 2 (two) times daily by mouth.   . D-MANNOSE PO Take 1,350 mg by mouth.  . Diclofenac Sodium 1.5 % SOLN 2g to affected area daily  . EQ ALLERGY RELIEF, CETIRIZINE, 10 MG tablet Take 1/2 (one-half) tablet by mouth once daily  . fluticasone (FLONASE) 50 MCG/ACT nasal spray Place 2 sprays into both nostrils daily. (Patient  not taking: Reported on 05/27/2019)  . FLUZONE HIGH-DOSE QUADRIVALENT 0.7 ML SUSY   . hydrochlorothiazide (HYDRODIURIL) 25 MG tablet Take 1 tablet (25 mg total) by mouth daily.  . Multiple Vitamin (MULTIVITAMIN) capsule Take 1 capsule by mouth daily.    . NONFORMULARY OR COMPOUNDED ITEM Apply 1 application topically as needed (Relaxing leg cream: Gnaphalium & polycephalum causticum).  Marland Kitchen omega-3 acid ethyl esters (LOVAZA) 1 g capsule Take 2 capsules (2 g total) by mouth 2 (two) times daily.  . polyethylene glycol (MIRALAX / GLYCOLAX) packet Take 17 g by mouth as needed.    . rosuvastatin (CRESTOR) 20 MG tablet Take 1 tablet (20 mg total) by mouth daily.       Past Surgical History:  Procedure Laterality Date  . CARPAL TUNNEL RELEASE    . HEMORRHOID SURGERY    . HEMORROIDECTOMY    . TOTAL ABDOMINAL HYSTERECTOMY      Family History  Problem Relation Age of Onset  . Coronary artery disease Father 61  . Neuropathy Father   . Diabetes Father   . Hypertension Father   . Heart attack Father   . Dementia Mother   . Healthy Sister   . COPD Brother   . COPD Brother   . COPD Brother   . Diabetes Sister   . Cancer Son  oral    New complaints: None today  Social history: Lives by herself. Family checks on her daily  Controlled substance contract: n/a    Review of Systems  Constitutional: Negative for diaphoresis.  Eyes: Negative for pain.  Respiratory: Negative for shortness of breath.   Cardiovascular: Negative for chest pain, palpitations and leg swelling.  Gastrointestinal: Negative for abdominal pain.  Endocrine: Negative for polydipsia.  Skin: Negative for rash.  Neurological: Negative for dizziness, weakness and headaches.  Hematological: Does not bruise/bleed easily.  All other systems reviewed and are negative.      Objective:   Physical Exam Vitals and nursing note reviewed.  Constitutional:      General: She is not in acute distress.    Appearance:  Normal appearance. She is well-developed.  HENT:     Head: Normocephalic.     Nose: Nose normal.  Eyes:     Pupils: Pupils are equal, round, and reactive to light.  Neck:     Vascular: No carotid bruit or JVD.  Cardiovascular:     Rate and Rhythm: Normal rate and regular rhythm.     Heart sounds: Normal heart sounds.  Pulmonary:     Effort: Pulmonary effort is normal. No respiratory distress.     Breath sounds: Normal breath sounds. No wheezing or rales.  Chest:     Chest wall: No tenderness.  Abdominal:     General: Bowel sounds are normal. There is no distension or abdominal bruit.     Palpations: Abdomen is soft. There is no hepatomegaly, splenomegaly, mass or pulsatile mass.     Tenderness: There is no abdominal tenderness.  Musculoskeletal:        General: Normal range of motion.     Cervical back: Normal range of motion and neck supple.     Comments: Walking with walker  Lymphadenopathy:     Cervical: No cervical adenopathy.  Skin:    General: Skin is warm and dry.  Neurological:     Mental Status: She is alert and oriented to person, place, and time.     Deep Tendon Reflexes: Reflexes are normal and symmetric.  Psychiatric:        Behavior: Behavior normal.        Thought Content: Thought content normal.        Judgment: Judgment normal.    BP (!) 179/89   Pulse 81   Temp 98.4 F (36.9 C) (Temporal)   Resp 20   Ht 5\' 4"  (1.626 m)   Wt 193 lb (87.5 kg)   SpO2 100%   BMI 33.13 kg/m         Assessment & Plan:  Wendy Robles comes in today with chief complaint of Medical Management of Chronic Issues (Right arm pain)   Diagnosis and orders addressed:  1. Primary hypertension Low sodium diet Continue to keep diary of blood pressure daily  2. Hyperlipidemia with target LDL less than 100 Low fat diet - rosuvastatin (CRESTOR) 20 MG tablet; Take 1 tablet (20 mg total) by mouth daily.  Dispense: 90 tablet; Refill: 1  3. Hereditary and idiopathic  peripheral neuropathy Do not go barefooted  4. Primary insomnia [place] time routine  5. Osteopenia of lumbar spine Will schedule dexascan  6. BMI 29.0-29.9,adult Discussed diet and exercise for person with BMI >25 Will recheck weight in 3-6 months   Labs pending Health Maintenance reviewed Diet and exercise encouraged  Follow up plan: 6 months   Mary-Margaret Hassell Done, FNP

## 2019-12-03 NOTE — Patient Instructions (Signed)

## 2019-12-03 NOTE — Addendum Note (Signed)
Addended by: Chevis Pretty on: 12/03/2019 12:56 PM   Modules accepted: Orders

## 2019-12-04 LAB — CMP14+EGFR
ALT: 29 IU/L (ref 0–32)
AST: 27 IU/L (ref 0–40)
Albumin/Globulin Ratio: 1.9 (ref 1.2–2.2)
Albumin: 4.8 g/dL — ABNORMAL HIGH (ref 3.6–4.6)
Alkaline Phosphatase: 53 IU/L (ref 44–121)
BUN/Creatinine Ratio: 27 (ref 12–28)
BUN: 19 mg/dL (ref 8–27)
Bilirubin Total: 0.4 mg/dL (ref 0.0–1.2)
CO2: 27 mmol/L (ref 20–29)
Calcium: 10.1 mg/dL (ref 8.7–10.3)
Chloride: 102 mmol/L (ref 96–106)
Creatinine, Ser: 0.71 mg/dL (ref 0.57–1.00)
GFR calc Af Amer: 88 mL/min/{1.73_m2} (ref >59)
GFR calc non Af Amer: 76 mL/min/{1.73_m2} (ref >59)
Globulin, Total: 2.5 g/dL (ref 1.5–4.5)
Glucose: 106 mg/dL — ABNORMAL HIGH (ref 65–99)
Potassium: 4.6 mmol/L (ref 3.5–5.2)
Sodium: 143 mmol/L (ref 134–144)
Total Protein: 7.3 g/dL (ref 6.0–8.5)

## 2019-12-04 LAB — CBC WITH DIFFERENTIAL/PLATELET
Basophils Absolute: 0.1 10*3/uL (ref 0.0–0.2)
Basos: 1 %
EOS (ABSOLUTE): 0.3 10*3/uL (ref 0.0–0.4)
Eos: 4 %
Hematocrit: 41.2 % (ref 34.0–46.6)
Hemoglobin: 13.7 g/dL (ref 11.1–15.9)
Immature Grans (Abs): 0 10*3/uL (ref 0.0–0.1)
Immature Granulocytes: 0 %
Lymphocytes Absolute: 2.5 10*3/uL (ref 0.7–3.1)
Lymphs: 40 %
MCH: 31.4 pg (ref 26.6–33.0)
MCHC: 33.3 g/dL (ref 31.5–35.7)
MCV: 94 fL (ref 79–97)
Monocytes Absolute: 0.6 10*3/uL (ref 0.1–0.9)
Monocytes: 10 %
Neutrophils Absolute: 2.8 10*3/uL (ref 1.4–7.0)
Neutrophils: 45 %
Platelets: 224 10*3/uL (ref 150–450)
RBC: 4.37 x10E6/uL (ref 3.77–5.28)
RDW: 12.5 % (ref 11.7–15.4)
WBC: 6.2 10*3/uL (ref 3.4–10.8)

## 2019-12-04 LAB — LIPID PANEL
Chol/HDL Ratio: 3.7 ratio (ref 0.0–4.4)
Cholesterol, Total: 145 mg/dL (ref 100–199)
HDL: 39 mg/dL — ABNORMAL LOW (ref >39)
LDL Chol Calc (NIH): 77 mg/dL (ref 0–99)
Triglycerides: 171 mg/dL — ABNORMAL HIGH (ref 0–149)
VLDL Cholesterol Cal: 29 mg/dL (ref 5–40)

## 2020-01-14 ENCOUNTER — Telehealth: Payer: Self-pay

## 2020-01-14 NOTE — Telephone Encounter (Signed)
Please tell patient she needs to bring in urine specimen

## 2020-01-14 NOTE — Telephone Encounter (Signed)
Patient feels like she has a urinary tract infection.  She is experiencing urinary frequency, dysuria, and some inability to hold her urine.  She would like to know if you can send in an antibiotic to [facility].  Please advise.

## 2020-01-15 ENCOUNTER — Other Ambulatory Visit: Payer: PPO

## 2020-01-15 ENCOUNTER — Other Ambulatory Visit: Payer: Self-pay

## 2020-01-15 DIAGNOSIS — R319 Hematuria, unspecified: Secondary | ICD-10-CM | POA: Diagnosis not present

## 2020-01-15 LAB — URINALYSIS, COMPLETE
Bilirubin, UA: NEGATIVE
Glucose, UA: NEGATIVE
Ketones, UA: NEGATIVE
Nitrite, UA: NEGATIVE
Specific Gravity, UA: 1.02 (ref 1.005–1.030)
Urobilinogen, Ur: 0.2 mg/dL (ref 0.2–1.0)
pH, UA: 7 (ref 5.0–7.5)

## 2020-01-15 LAB — MICROSCOPIC EXAMINATION: WBC, UA: >30 /hpf — AB (ref 0–5)

## 2020-01-15 NOTE — Telephone Encounter (Signed)
Patient stated that she will have a friend drop a urine specimen by today

## 2020-01-17 MED ORDER — AMOXICILLIN-POT CLAVULANATE 875-125 MG PO TABS: 1.0000 | ORAL_TABLET | Freq: Two times a day (BID) | ORAL | 0 refills | Status: DC

## 2020-01-21 ENCOUNTER — Telehealth: Payer: Self-pay

## 2020-01-21 LAB — URINE CULTURE

## 2020-01-21 NOTE — Telephone Encounter (Signed)
Pt has question about taking milk of magnesium while on abx. Please call back

## 2020-01-22 NOTE — Telephone Encounter (Signed)
Pt states, she took some last night and was fine, she couldn't wait for [place] to call her back.

## 2020-03-20 ENCOUNTER — Ambulatory Visit: Payer: PPO | Admitting: Nurse Practitioner

## 2020-03-22 ENCOUNTER — Other Ambulatory Visit: Payer: Self-pay | Admitting: Nurse Practitioner

## 2020-03-22 DIAGNOSIS — E785 Hyperlipidemia, unspecified: Secondary | ICD-10-CM

## 2020-05-25 ENCOUNTER — Other Ambulatory Visit: Payer: Self-pay | Admitting: Nurse Practitioner

## 2020-05-25 DIAGNOSIS — J301 Allergic rhinitis due to pollen: Secondary | ICD-10-CM

## 2020-05-25 DIAGNOSIS — J069 Acute upper respiratory infection, unspecified: Secondary | ICD-10-CM

## 2020-06-01 ENCOUNTER — Encounter: Payer: Self-pay | Admitting: Nurse Practitioner

## 2020-06-01 ENCOUNTER — Other Ambulatory Visit: Payer: Self-pay

## 2020-06-01 ENCOUNTER — Ambulatory Visit (INDEPENDENT_AMBULATORY_CARE_PROVIDER_SITE_OTHER): Payer: PPO | Admitting: Nurse Practitioner

## 2020-06-01 VITALS — BP 148/90 | HR 80 | Temp 98.6°F | Resp 20 | Ht 64.0 in | Wt 194.0 lb

## 2020-06-01 DIAGNOSIS — F5101 Primary insomnia: Secondary | ICD-10-CM

## 2020-06-01 DIAGNOSIS — M8588 Other specified disorders of bone density and structure, other site: Secondary | ICD-10-CM

## 2020-06-01 DIAGNOSIS — E785 Hyperlipidemia, unspecified: Secondary | ICD-10-CM

## 2020-06-01 DIAGNOSIS — G609 Hereditary and idiopathic neuropathy, unspecified: Secondary | ICD-10-CM

## 2020-06-01 DIAGNOSIS — I10 Essential (primary) hypertension: Secondary | ICD-10-CM

## 2020-06-01 DIAGNOSIS — Z6829 Body mass index (BMI) 29.0-29.9, adult: Secondary | ICD-10-CM | POA: Diagnosis not present

## 2020-06-01 LAB — CBC WITH DIFFERENTIAL/PLATELET
Basophils Absolute: 0.1 10*3/uL (ref 0.0–0.2)
Basos: 1 %
EOS (ABSOLUTE): 0.3 10*3/uL (ref 0.0–0.4)
Eos: 5 %
Hematocrit: 40.9 % (ref 34.0–46.6)
Hemoglobin: 14 g/dL (ref 11.1–15.9)
Immature Grans (Abs): 0 10*3/uL (ref 0.0–0.1)
Immature Granulocytes: 0 %
Lymphocytes Absolute: 2.1 10*3/uL (ref 0.7–3.1)
Lymphs: 35 %
MCH: 31.5 pg (ref 26.6–33.0)
MCHC: 34.2 g/dL (ref 31.5–35.7)
MCV: 92 fL (ref 79–97)
Monocytes Absolute: 0.5 10*3/uL (ref 0.1–0.9)
Monocytes: 8 %
Neutrophils Absolute: 3.2 10*3/uL (ref 1.4–7.0)
Neutrophils: 51 %
Platelets: 218 10*3/uL (ref 150–450)
RBC: 4.45 x10E6/uL (ref 3.77–5.28)
RDW: 12.6 % (ref 11.7–15.4)
WBC: 6.2 10*3/uL (ref 3.4–10.8)

## 2020-06-01 LAB — CMP14+EGFR
ALT: 25 IU/L (ref 0–32)
AST: 26 IU/L (ref 0–40)
Albumin/Globulin Ratio: 2.2 (ref 1.2–2.2)
Albumin: 5.1 g/dL — ABNORMAL HIGH (ref 3.6–4.6)
Alkaline Phosphatase: 51 IU/L (ref 44–121)
BUN/Creatinine Ratio: 31 — ABNORMAL HIGH (ref 12–28)
BUN: 19 mg/dL (ref 8–27)
Bilirubin Total: 0.4 mg/dL (ref 0.0–1.2)
CO2: 26 mmol/L (ref 20–29)
Calcium: 10.3 mg/dL (ref 8.7–10.3)
Chloride: 103 mmol/L (ref 96–106)
Creatinine, Ser: 0.62 mg/dL (ref 0.57–1.00)
Globulin, Total: 2.3 g/dL (ref 1.5–4.5)
Glucose: 102 mg/dL — ABNORMAL HIGH (ref 65–99)
Potassium: 4.5 mmol/L (ref 3.5–5.2)
Sodium: 145 mmol/L — ABNORMAL HIGH (ref 134–144)
Total Protein: 7.4 g/dL (ref 6.0–8.5)
eGFR: 85 mL/min/{1.73_m2} (ref >59)

## 2020-06-01 LAB — LIPID PANEL
Chol/HDL Ratio: 3.1 ratio (ref 0.0–4.4)
Cholesterol, Total: 132 mg/dL (ref 100–199)
HDL: 42 mg/dL (ref >39)
LDL Chol Calc (NIH): 62 mg/dL (ref 0–99)
Triglycerides: 167 mg/dL — ABNORMAL HIGH (ref 0–149)
VLDL Cholesterol Cal: 28 mg/dL (ref 5–40)

## 2020-06-01 NOTE — [facility] Notes (Signed)
 Subjective:    Patient ID: Wendy Robles, female    DOB: 05/23/1929, 87 y.o.   MRN: [id number]   Chief Complaint: Medical Management of Chronic Issues    HPI:  1. Primary hypertension No c/o chest pain, sob or headache. Does  check blood pressure at home. Runs in the 130-140's systolic BP Readings from Last 3 Encounters:  06/01/20 (!) 182/87  12/03/19 (!) 179/89  05/27/19 (!) 152/84     2. Hyperlipidemia with target LDL less than 100 Does try to watch diet. Does no dedicated exercise. Lab Results  Component Value Date   CHOL 145 12/03/2019   HDL 39 (L) 12/03/2019   LDLCALC 77 12/03/2019   TRIG 171 (H) 12/03/2019   CHOLHDL 3.7 12/03/2019     3. Hereditary and idiopathic peripheral neuropathy Has burning sensation in bil feet.  4. Primary insomnia Sleeps good but has to get up several times to void at night.  5. Osteopenia of lumbar spine Last dexascan ws done 10/12/17. t score was -0.3. She has aged out of dexascans  6. BMI 29.0-29.9,adult No recent weight changes Wt Readings from Last 3 Encounters:  06/01/20 194 lb (88 kg)  12/03/19 193 lb (87.5 kg)  05/27/19 195 lb 3.2 oz (88.5 kg)   BMI Readings from Last 3 Encounters:  06/01/20 33.30 kg/m  12/03/19 33.13 kg/m  05/27/19 33.51 kg/m  '    Outpatient Encounter Medications as of 06/01/2020  Medication Sig  . acetaminophen (TYLENOL) 650 MG CR tablet Take 650 mg by mouth every 8 (eight) hours as needed for pain.  . aspirin 81 MG tablet Take 81 mg by mouth daily.  . cetirizine (EQ ALLERGY RELIEF, CETIRIZINE,) 10 MG tablet Take 1/2 (one-half) tablet by mouth once daily  . Cholecalciferol (VITAMIN D) 2000 units CAPS Take 1 capsule 2 (two) times daily by mouth.   . Multiple Vitamin (MULTIVITAMIN) capsule Take 1 capsule by mouth daily.  . NONFORMULARY OR COMPOUNDED ITEM Apply 1 application topically as needed (Relaxing leg cream: Gnaphalium & polycephalum causticum).  . omega-3 acid ethyl esters (LOVAZA) 1 g  capsule Take 2 capsules by mouth twice daily  . polyethylene glycol (MIRALAX / GLYCOLAX) packet Take 17 g by mouth as needed.  . rosuvastatin (CRESTOR) 20 MG tablet Take 1 tablet (20 mg total) by mouth daily.  . [DISCONTINUED] amoxicillin-clavulanate (AUGMENTIN) 875-125 MG tablet Take 1 tablet by mouth 2 (two) times daily.   No facility-administered encounter medications on file as of 06/01/2020.    Past Surgical History:  Procedure Laterality Date  . CARPAL TUNNEL RELEASE    . HEMORRHOID SURGERY    . HEMORROIDECTOMY    . TOTAL ABDOMINAL HYSTERECTOMY      Family History  Problem Relation Age of Onset  . Coronary artery disease Father 82  . Neuropathy Father   . Diabetes Father   . Hypertension Father   . Heart attack Father   . Dementia Mother   . Healthy Sister   . COPD Brother   . COPD Brother   . COPD Brother   . Diabetes Sister   . Cancer Son        oral    New complaints: None today  Social history: Lives by herself  Controlled substance contract: n/a    Review of Systems  Constitutional: Negative for diaphoresis.  Eyes: Negative for pain.  Respiratory: Negative for shortness of breath.   Cardiovascular: Negative for chest pain, palpitations and leg swelling.  Gastrointestinal: Negative   for abdominal pain.  Endocrine: Negative for polydipsia.  Skin: Negative for rash.  Neurological: Negative for dizziness, weakness and headaches.  Hematological: Does not bruise/bleed easily.  All other systems reviewed and are negative.      Objective:   Physical Exam Vitals and nursing note reviewed.  Constitutional:      General: She is not in acute distress.    Appearance: Normal appearance. She is well-developed.  HENT:     Head: Normocephalic.     Nose: Nose normal.  Eyes:     Pupils: Pupils are equal, round, and reactive to light.  Neck:     Vascular: No carotid bruit or JVD.  Cardiovascular:     Rate and Rhythm: Normal rate and regular rhythm.      Heart sounds: Normal heart sounds.  Pulmonary:     Effort: Pulmonary effort is normal. No respiratory distress.     Breath sounds: Normal breath sounds. No wheezing or rales.  Chest:     Chest wall: No tenderness.  Abdominal:     General: Bowel sounds are normal. There is no distension or abdominal bruit.     Palpations: Abdomen is soft. There is no hepatomegaly, splenomegaly, mass or pulsatile mass.     Tenderness: There is no abdominal tenderness.  Musculoskeletal:        General: Normal range of motion.     Cervical back: Normal range of motion and neck supple.     Right lower leg: Edema (mild) present.     Left lower leg: Edema (mild) present.  Lymphadenopathy:     Cervical: No cervical adenopathy.  Skin:    General: Skin is warm and dry.  Neurological:     Mental Status: She is alert and oriented to person, place, and time.     Deep Tendon Reflexes: Reflexes are normal and symmetric.  Psychiatric:        Behavior: Behavior normal.        Thought Content: Thought content normal.        Judgment: Judgment normal.    BP (!) 148/90   Pulse 80   Temp 98.6 F (37 C) (Temporal)   Resp 20   Ht 5' 4" (1.626 m)   Wt 194 lb (88 kg)   SpO2 97%   BMI 33.30 kg/m          Assessment & Plan:  [name] comes in today with chief complaint of Medical Management of Chronic Issues   Diagnosis and orders addressed:  1. Primary hypertension Blood pressures are running good at home continueto check daily - CBC with Differential/Platelet - CMP14+EGFR  2. Hyperlipidemia with target LDL less than 100 Low fat diet - Lipid panel  3. Hereditary and idiopathic peripheral neuropathy Do not go barefooted  4. Primary insomnia Bedtime routine  5. Osteopenia of lumbar spine Weight bearing exercises No longer wants to do dexascans  6. BMI 29.0-29.9,adult Discussed diet and exercise for person with BMI >25 Will recheck weight in 3-6 months    Labs pending Health  Maintenance reviewed Diet and exercise encouraged  Follow up plan: 6 months   Mary-Margaret Martin, FNP  

## 2020-06-01 NOTE — Patient Instructions (Signed)
Fall Prevention in the Home, Adult Falls can cause injuries and can happen to people of all ages. There are many things you can do to make your home safe and to help prevent falls. Ask for help when making these changes. What actions can I take to prevent falls? General Instructions  Use good lighting in all rooms. Replace any light bulbs that burn out.  Turn on the lights in dark areas. Use night-lights.  Keep items that you use often in easy-to-reach places. Lower the shelves around your home if needed.  Set up your furniture so you have a clear path. Avoid moving your furniture around.  Do not have throw rugs or other things on the floor that can make you trip.  Avoid walking on wet floors.  If any of your floors are uneven, fix them.  Add color or contrast paint or tape to clearly mark and help you see: ? Grab bars or handrails. ? First and last steps of staircases. ? Where the edge of each step is.  If you use a stepladder: ? Make sure that it is fully opened. Do not climb a closed stepladder. ? Make sure the sides of the stepladder are locked in place. ? Ask someone to hold the stepladder while you use it.  Know where your pets are when moving through your home. What can I do in the bathroom?  Keep the floor dry. Clean up any water on the floor right away.  Remove soap buildup in the tub or shower.  Use nonskid mats or decals on the floor of the tub or shower.  Attach bath mats securely with double-sided, nonslip rug tape.  If you need to sit down in the shower, use a plastic, nonslip stool.  Install grab bars by the toilet and in the tub and shower. Do not use towel bars as grab bars.      What can I do in the bedroom?  Make sure that you have a light by your bed that is easy to reach.  Do not use any sheets or blankets for your bed that hang to the floor.  Have a firm chair with side arms that you can use for support when you get dressed. What can I do in  the kitchen?  Clean up any spills right away.  If you need to reach something above you, use a step stool with a grab bar.  Keep electrical cords out of the way.  Do not use floor polish or wax that makes floors slippery. What can I do with my stairs?  Do not leave any items on the stairs.  Make sure that you have a light switch at the top and the bottom of the stairs.  Make sure that there are handrails on both sides of the stairs. Fix handrails that are broken or loose.  Install nonslip stair treads on all your stairs.  Avoid having throw rugs at the top or bottom of the stairs.  Choose a carpet that does not hide the edge of the steps on the stairs.  Check carpeting to make sure that it is firmly attached to the stairs. Fix carpet that is loose or worn. What can I do on the outside of my home?  Use bright outdoor lighting.  Fix the edges of walkways and driveways and fix any cracks.  Remove anything that might make you trip as you walk through a door, such as a raised step or threshold.  Trim any   bushes or trees on paths to your home.  Check to see if handrails are loose or broken and that both sides of all steps have handrails.  Install guardrails along the edges of any raised decks and porches.  Clear paths of anything that can make you trip, such as tools or rocks.  Have leaves, snow, or ice cleared regularly.  Use sand or salt on paths during winter.  Clean up any spills in your garage right away. This includes grease or oil spills. What other actions can I take?  Wear shoes that: ? Have a low heel. Do not wear high heels. ? Have rubber bottoms. ? Feel good on your feet and fit well. ? Are closed at the toe. Do not wear open-toe sandals.  Use tools that help you move around if needed. These include: ? Canes. ? Walkers. ? Scooters. ? Crutches.  Review your medicines with your doctor. Some medicines can make you feel dizzy. This can increase your chance  of falling. Ask your doctor what else you can do to help prevent falls. Where to find more information  Centers for Disease Control and Prevention, [employer]: www.cdc.gov  National Institute on Aging: www.nia.nih.gov Contact a doctor if:  You are afraid of falling at home.  You feel weak, drowsy, or dizzy at home.  You fall at home. Summary  There are many simple things that you can do to make your home safe and to help prevent falls.  Ways to make your home safe include removing things that can make you trip and installing grab bars in the bathroom.  Ask for help when making these changes in your home. This information is not intended to replace advice given to you by your health care provider. Make sure you discuss any questions you have with your health care provider. Document Revised: 08/21/2019 Document Reviewed: 08/21/2019 Elsevier Patient Education  2021 [employer].  

## 2020-07-02 ENCOUNTER — Other Ambulatory Visit: Payer: Self-pay | Admitting: Nurse Practitioner

## 2020-07-02 DIAGNOSIS — E785 Hyperlipidemia, unspecified: Secondary | ICD-10-CM

## 2020-09-14 ENCOUNTER — Other Ambulatory Visit: Payer: Self-pay | Admitting: Nurse Practitioner

## 2020-09-14 DIAGNOSIS — J069 Acute upper respiratory infection, unspecified: Secondary | ICD-10-CM

## 2020-09-14 DIAGNOSIS — J301 Allergic rhinitis due to pollen: Secondary | ICD-10-CM

## 2020-10-13 ENCOUNTER — Ambulatory Visit (INDEPENDENT_AMBULATORY_CARE_PROVIDER_SITE_OTHER): Payer: PPO

## 2020-10-13 ENCOUNTER — Telehealth: Payer: Self-pay

## 2020-10-13 VITALS — Ht 64.0 in | Wt 194.0 lb

## 2020-10-13 DIAGNOSIS — Z0000 Encounter for general adult medical examination without abnormal findings: Secondary | ICD-10-CM | POA: Diagnosis not present

## 2020-10-13 DIAGNOSIS — Z748 Other problems related to care provider dependency: Secondary | ICD-10-CM | POA: Diagnosis not present

## 2020-10-13 NOTE — Telephone Encounter (Signed)
   Telephone encounter was:  Successful.  10/13/2020 Name: CHLORIS ZUKAUSKAS MRN: [id number] DOB: 05/17/1929  TARESHA BALUYOT is a 87 y.o. year old female who is a primary care patient of Chevis Pretty, [place] . The community resource team was consulted for assistance with Transportation Needs   Care guide performed the following interventions:  I was able to reach pt on her home number instead of cell phone. She advised, all she needs in Transportation to her [place] appointment. She is requesting a vehicle with a wheelchair lift due to her weakness and being unable to slide or bend in a car.  Follow Up Plan:  Care guide will follow up with patient by phone over the next few days after sending a request to [employer].  [place] management  [place], [place]  Main Phone: [phone]  E-mail: [email or web address]  Website: [email or web address]

## 2020-10-13 NOTE — Progress Notes (Signed)
Subjective:   Wendy Robles is a 87 y.o. female who presents for Medicare Annual (Subsequent) preventive examination.  Virtual Visit via Telephone Note  I connected with  Wendy Robles on 10/13/20 at 10:30 AM EDT by telephone and verified that I am speaking with the correct person using two identifiers.  Location: Patient: Home Provider: [facility] Persons participating in the virtual visit: patient/Nurse Health Advisor   I discussed the limitations, risks, security and privacy concerns of performing an evaluation and management service by telephone and the availability of in person appointments. The patient expressed understanding and agreed to proceed.  Interactive audio and video telecommunications were attempted between this nurse and patient, however failed, due to patient having technical difficulties OR patient did not have access to video capability.  We continued and completed visit with audio only.  Some vital signs may be absent or patient reported.   Wendy Manuelito E Kyston Gonce, LPN   Review of Systems     Cardiac Risk Factors include: advanced age (>44mn, >>42women);dyslipidemia;hypertension;obesity (BMI >30kg/m2);sedentary lifestyle     Objective:    Today's Vitals   10/13/20 1006  Weight: 194 lb (88 kg)  Height: '5\' 4"'$  (1.626 m)   Body mass index is 33.3 kg/m.  Advanced Directives 10/13/2020 04/29/2019  Does Patient Have a Medical Advance Directive? Yes Yes  Type of [place] Living will  Copy of [place] Chart? No - copy requested -    Current Medications (verified) Outpatient Encounter Medications as of 10/13/2020  Medication Sig   acetaminophen (TYLENOL) 650 MG CR tablet Take 650 mg by mouth every 8 (eight) hours as needed for pain.   aspirin 81 MG tablet Take 81 mg by mouth daily.   Cholecalciferol (VITAMIN D) 2000 units CAPS Take 1 capsule 2 (two) times daily by mouth.    EQ ALLERGY RELIEF,  CETIRIZINE, 10 MG tablet Take 1/2 (one-half) tablet by mouth once daily   Multiple Vitamin (MULTIVITAMIN) capsule Take 1 capsule by mouth daily.   NONFORMULARY OR COMPOUNDED ITEM Apply 1 application topically as needed (Relaxing leg cream: Gnaphalium & polycephalum causticum).   omega-3 acid ethyl esters (LOVAZA) 1 g capsule Take 2 capsules by mouth twice daily   polyethylene glycol (MIRALAX / GLYCOLAX) packet Take 17 g by mouth as needed.   rosuvastatin (CRESTOR) 20 MG tablet Take 1 tablet by mouth once daily   No facility-administered encounter medications on file as of 10/13/2020.    Allergies (verified) Neurontin [gabapentin] and Bactrim [sulfamethoxazole-trimethoprim]   History: Past Medical History:  Diagnosis Date   Arthritis    Colon polyps    Diverticulosis    Dyslipidemia    HTN (hypertension)    White coat   Hyperlipidemia    Osteoporosis    Past Surgical History:  Procedure Laterality Date   CARPAL TUNNEL RELEASE     HEMORRHOID SURGERY     HEMORROIDECTOMY     TOTAL ABDOMINAL HYSTERECTOMY     Family History  Problem Relation Age of Onset   Coronary artery disease Father 880  Neuropathy Father    Diabetes Father    Hypertension Father    Heart attack Father    Dementia Mother    Healthy Sister    COPD Brother    COPD Brother    COPD Brother    Diabetes Sister    Cancer Son        oral   Social History  Socioeconomic History   Marital status: Widowed    Spouse name: Not on file   Number of children: 2   Years of education: 7   Highest education level: 7th grade  Occupational History    Employer: RETIRED  Tobacco Use   Smoking status: Never   Smokeless tobacco: Never  Vaping Use   Vaping Use: Never used  Substance and Sexual Activity   Alcohol use: No   Drug use: No   Sexual activity: Not Currently  Other Topics Concern   Not on file  Social History Narrative   Son passed away from cancer in 05-May-2014   Daughter lives in [place], [place]    Sister in [place], but has lots of health problems   She has a friend who helps her clean her home and shops for her once per week   Her cousin lives next door and checks mail and checks on her daily.   Social Determinants of Health   Financial Resource Strain: Low Risk    Difficulty of Paying Living Expenses: Not hard at all  Food Insecurity: No Food Insecurity   Worried About Charity fundraiser in the Last Year: Never true   Arboriculturist in the Last Year: Never true  Transportation Needs: Unmet Transportation Needs   Lack of Transportation (Medical): Yes   Lack of Transportation (Non-Medical): Yes  Physical Activity: Inactive   Days of Exercise per Week: 0 days   Minutes of Exercise per Session: 0 min  Stress: Stress Concern Present   Feeling of Stress : To some extent  Social Connections: Socially Isolated   Frequency of Communication with Friends and Family: More than three times a week   Frequency of Social Gatherings with Friends and Family: Once a week   Attends Religious Services: Never   Marine scientist or Organizations: No   Attends Archivist Meetings: Never   Marital Status: Widowed    Tobacco Counseling Counseling given: Not Answered   Clinical Intake:  Pre-visit preparation completed: Yes  Pain : No/denies pain     BMI - recorded: 33.3 Nutritional Status: BMI > 30  Obese Nutritional Risks: None Diabetes: No  How often do you need to have someone help you when you read instructions, pamphlets, or other written materials from your doctor or pharmacy?: 1 - Never  Diabetic? No  Interpreter Needed?: No  Information entered by :: Wendy Disanti, LPN   Activities of Daily Living In your present state of health, do you have any difficulty performing the following activities: 10/13/2020  Hearing? Y  Comment mild - declines hearing difficulties  Vision? N  Difficulty concentrating or making decisions? N  Walking or climbing stairs? Y   Dressing or bathing? N  Doing errands, shopping? Y  Preparing Food and eating ? N  Using the Toilet? N  In the past six months, have you accidently leaked urine? Y  Do you have problems with loss of bowel control? N  Managing your Medications? N  Managing your Finances? N  Housekeeping or managing your Housekeeping? Y  Some recent data might be hidden    Patient Care Team: Chevis Pretty, FNP as PCP - General (Nurse Practitioner) Melina Schools, OD (Optometry)  Indicate any recent Medical Services you may have received from other than [facility] providers in the past year (date may be approximate).     Assessment:   This is a routine wellness examination for [place].  Hearing/Vision screen Hearing  Screening - Comments:: C/o mild hearing difficulties - declines hearing aids Vision Screening - Comments:: Wears eyeglasses - up to date with annual eye exams with [facility]  Dietary issues and exercise activities discussed: Current Exercise Habits: The patient does not participate in regular exercise at present, Exercise limited by: orthopedic condition(s)   Goals Addressed             This Visit's Progress    DIET - EAT MORE FRUITS AND VEGETABLES   On track    Exercise 150 minutes per week (moderate activity)   Not on track    Chair exercises daily (see handout)       Depression Screen PHQ 2/9 Scores 10/13/2020 12/03/2019 05/27/2019 11/23/2018 08/21/2018 05/17/2018 02/13/2018  PHQ - 2 Score 0 0 0 0 0 0 0    Fall Risk Fall Risk  10/13/2020 06/01/2020 12/03/2019 05/27/2019 04/29/2019  Falls in the past year? 1 1 0 0 0  Comment - - - - -  Number falls in past yr: 0 0 - 0 -  Injury with Fall? 0 0 - 0 -  Risk Factor Category  - - - - -  Risk for fall due to : History of fall(s);Impaired balance/gait;Impaired vision;Orthopedic patient History of fall(s) - Impaired balance/gait;Impaired mobility -  Follow up Education provided;Falls prevention discussed Education provided -  Falls evaluation completed -    FALL RISK PREVENTION PERTAINING TO THE HOME:  Any stairs in or around the home? No  If so, are there any without handrails? No  Home free of loose throw rugs in walkways, pet beds, electrical cords, etc? Yes  Adequate lighting in your home to reduce risk of falls? Yes   ASSISTIVE DEVICES UTILIZED TO PREVENT FALLS:  Life alert? No  Use of a cane, walker or w/c? Yes  Grab bars in the bathroom? Yes  Shower chair or bench in shower? Yes  Elevated toilet seat or a handicapped toilet? Yes   TIMED UP AND GO:  Was the test performed? No . Telephonic visit  Cognitive Function: Normal cognitive status assessed by direct observation by this Nurse Health Advisor. No abnormalities found.    MMSE - Mini Mental State Exam 09/13/2017 08/16/2016  Orientation to time 5 5  Orientation to Place 5 3  Registration 3 3  Attention/ Calculation 5 2  Recall 2 1  Language- name 2 objects 2 2  Language- repeat 1 0  Language- follow 3 step command 3 3  Language- read & follow direction 1 1  Write a sentence 1 1  Copy design 0 1  Total score 28 22     6CIT Screen 04/29/2019  What Year? 0 points  What month? 0 points  What time? 0 points  Count back from 20 0 points  Months in reverse 0 points  Repeat phrase 0 points  Total Score 0    Immunizations Immunization History  Administered Date(s) Administered   Fluad Quad(high Dose 65+) 12/03/2019   Influenza, High Dose Seasonal PF 11/02/2015, 11/08/2016, 12/05/2017   Influenza,inj,Quad PF,6+ Mos 11/04/2013, 12/08/2014   Influenza-Unspecified 10/31/2012, 12/11/2018   Moderna SARS-COV2 Booster Vaccination 01/02/2020   Moderna Sars-Covid-2 Vaccination 03/06/2019, 04/04/2019   Pneumococcal Conjugate-13 07/24/2013   Pneumococcal Polysaccharide-23 08/14/2014    TDAP status: Due, Education has been provided regarding the importance of this vaccine. Advised may receive this vaccine at local pharmacy or Health Dept.  Aware to provide a copy of the vaccination record if obtained from local pharmacy or Health  Dept. Verbalized acceptance and understanding.  Flu Vaccine status: Up to date  Pneumococcal vaccine status: Up to date  Covid-19 vaccine status: Completed vaccines  Qualifies for Shingles Vaccine? Yes   Zostavax completed No   Shingrix Completed?: No.    Education has been provided regarding the importance of this vaccine. Patient has been advised to call insurance company to determine out of pocket expense if they have not yet received this vaccine. Advised may also receive vaccine at local pharmacy or Health Dept. Verbalized acceptance and understanding.  Screening Tests Health Maintenance  Topic Date Due   Zoster Vaccines- Shingrix (1 of 2) Never done   COVID-19 Vaccine (4 - Booster for Moderna series) 05/02/2020   INFLUENZA VACCINE  08/31/2020   TETANUS/TDAP  12/02/2020 (Originally 05/15/1948)   DEXA SCAN  Completed   PNA vac Low Risk Adult  Completed   HPV VACCINES  Aged Out   MAMMOGRAM  Discontinued    Health Maintenance  Health Maintenance Due  Topic Date Due   Zoster Vaccines- Shingrix (1 of 2) Never done   COVID-19 Vaccine (4 - Booster for Moderna series) 05/02/2020   INFLUENZA VACCINE  08/31/2020    Colorectal cancer screening: No longer required.   Mammogram status: No longer required due to age.  Bone Density  Scan: Unable to get on table  Lung Cancer Screening: (Low Dose CT Chest recommended if Age 33-80 years, 30 pack-year currently smoking OR have quit w/in 15years.) does not qualify.   Additional Screening:  Hepatitis C Screening: does not qualify  Vision Screening: Recommended annual ophthalmology exams for early detection of glaucoma and other disorders of the eye. Is the patient up to date with their annual eye exam?  Yes  Who is the provider or what is the name of the office in which the patient attends annual eye exams? [place] If pt is not  established with a provider, would they like to be referred to a provider to establish care? No .   Dental Screening: Recommended annual dental exams for proper oral hygiene  Community Resource Referral / Chronic Care Management: CRR required this visit?  Yes   CCM required this visit?  No      Plan:     I have personally reviewed and noted the following in the patient's chart:   Medical and social history Use of alcohol, tobacco or illicit drugs  Current medications and supplements including opioid prescriptions.  Functional ability and status Nutritional status Physical activity Advanced directives List of other physicians Hospitalizations, surgeries, and ER visits in previous 12 months Vitals Screenings to include cognitive, depression, and falls Referrals and appointments  In addition, I have reviewed and discussed with patient certain preventive protocols, quality metrics, and best practice recommendations. A written personalized care plan for preventive services as well as general preventive health recommendations were provided to patient.     Wendy Hammond, LPN   [id number]   Nurse Notes: Fear of falling - increased weakness - cannot drive Sent referral for transportation assistance.

## 2020-10-13 NOTE — Patient Instructions (Signed)
Wendy Robles , Thank you for taking time to come for your Medicare Wellness Visit. I appreciate your ongoing commitment to your health goals. Please review the following plan we discussed and let me know if I can assist you in the future.   Screening recommendations/referrals: Colonoscopy: No longer required Mammogram:  No longer required Bone Density: No longer required Recommended yearly ophthalmology/optometry visit for glaucoma screening and checkup Recommended yearly dental visit for hygiene and checkup  Vaccinations: Influenza vaccine: Done 12/03/2019 - Repeat annually  Pneumococcal vaccine: Done 07/24/2013 & 08/14/2014 Tdap vaccine: Due. Every 10 years Shingles vaccine: Due. Shingrix discussed. Please contact your pharmacy for coverage information.     Covid-19: done 03/06/2019, 04/04/2019, & 01/02/2020  Advanced directives: Please bring a copy of your health care power of attorney and living will to the office to be added to your chart at your convenience.   Conditions/risks identified: Try strength and balance exercises and seated exercises. 5-10 minutes at a time 3-5 times per day. Aim for 6-8 glasses of water daily. Practice fall prevention.  Next appointment: Follow up in one year for your annual wellness visit    Preventive Care 65 Years and Older, Female Preventive care refers to lifestyle choices and visits with your health care provider that can promote health and wellness. What does preventive care include? A yearly physical exam. This is also called an annual well check. Dental exams once or twice a year. Routine eye exams. Ask your health care provider how often you should have your eyes checked. Personal lifestyle choices, including: Daily care of your teeth and gums. Regular physical activity. Eating a healthy diet. Avoiding tobacco and drug use. Limiting alcohol use. Practicing safe sex. Taking low-dose aspirin every day. Taking vitamin and mineral supplements as  recommended by your health care provider. What happens during an annual well check? The services and screenings done by your health care provider during your annual well check will depend on your age, overall health, lifestyle risk factors, and family history of disease. Counseling  Your health care provider may ask you questions about your: Alcohol use. Tobacco use. Drug use. Emotional well-being. Home and relationship well-being. Sexual activity. Eating habits. History of falls. Memory and ability to understand (cognition). Work and work Statistician. Reproductive health. Screening  You may have the following tests or measurements: Height, weight, and BMI. Blood pressure. Lipid and cholesterol levels. These may be checked every 5 years, or more frequently if you are over 11 years old. Skin check. Lung cancer screening. You may have this screening every year starting at age 8 if you have a 30-pack-year history of smoking and currently smoke or have quit within the past 15 years. Fecal occult blood test (FOBT) of the stool. You may have this test every year starting at age 42. Flexible sigmoidoscopy or colonoscopy. You may have a sigmoidoscopy every 5 years or a colonoscopy every 10 years starting at age 74. Hepatitis C blood test. Hepatitis B blood test. Sexually transmitted disease (STD) testing. Diabetes screening. This is done by checking your blood sugar (glucose) after you have not eaten for a while (fasting). You may have this done every 1-3 years. Bone density scan. This is done to screen for osteoporosis. You may have this done starting at age 62. Mammogram. This may be done every 1-2 years. Talk to your health care provider about how often you should have regular mammograms. Talk with your health care provider about your test results, treatment options, and if  necessary, the need for more tests. Vaccines  Your health care provider may recommend certain vaccines, such  as: Influenza vaccine. This is recommended every year. Tetanus, diphtheria, and acellular pertussis (Tdap, Td) vaccine. You may need a Td booster every 10 years. Zoster vaccine. You may need this after age 21. Pneumococcal 13-valent conjugate (PCV13) vaccine. One dose is recommended after age 65. Pneumococcal polysaccharide (PPSV23) vaccine. One dose is recommended after age 33. Talk to your health care provider about which screenings and vaccines you need and how often you need them. This information is not intended to replace advice given to you by your health care provider. Make sure you discuss any questions you have with your health care provider. Document Released: 02/13/2015 Document Revised: 10/07/2015 Document Reviewed: 11/18/2014 Elsevier Interactive Patient Education  2017 [place] Prevention in the Home Falls can cause injuries. They can happen to people of all ages. There are many things you can do to make your home safe and to help prevent falls. What can I do on the outside of my home? Regularly fix the edges of walkways and driveways and fix any cracks. Remove anything that might make you trip as you walk through a door, such as a raised step or threshold. Trim any bushes or trees on the path to your home. Use bright outdoor lighting. Clear any walking paths of anything that might make someone trip, such as rocks or tools. Regularly check to see if handrails are loose or broken. Make sure that both sides of any steps have handrails. Any raised decks and porches should have guardrails on the edges. Have any leaves, snow, or ice cleared regularly. Use sand or salt on walking paths during winter. Clean up any spills in your garage right away. This includes oil or grease spills. What can I do in the bathroom? Use night lights. Install grab bars by the toilet and in the tub and shower. Do not use towel bars as grab bars. Use non-skid mats or decals in the tub or  shower. If you need to sit down in the shower, use a plastic, non-slip stool. Keep the floor dry. Clean up any water that spills on the floor as soon as it happens. Remove soap buildup in the tub or shower regularly. Attach bath mats securely with double-sided non-slip rug tape. Do not have throw rugs and other things on the floor that can make you trip. What can I do in the bedroom? Use night lights. Make sure that you have a light by your bed that is easy to reach. Do not use any sheets or blankets that are too big for your bed. They should not hang down onto the floor. Have a firm chair that has side arms. You can use this for support while you get dressed. Do not have throw rugs and other things on the floor that can make you trip. What can I do in the kitchen? Clean up any spills right away. Avoid walking on wet floors. Keep items that you use a lot in easy-to-reach places. If you need to reach something above you, use a strong step stool that has a grab bar. Keep electrical cords out of the way. Do not use floor polish or wax that makes floors slippery. If you must use wax, use non-skid floor wax. Do not have throw rugs and other things on the floor that can make you trip. What can I do with my stairs? Do not leave any items  on the stairs. Make sure that there are handrails on both sides of the stairs and use them. Fix handrails that are broken or loose. Make sure that handrails are as long as the stairways. Check any carpeting to make sure that it is firmly attached to the stairs. Fix any carpet that is loose or worn. Avoid having throw rugs at the top or bottom of the stairs. If you do have throw rugs, attach them to the floor with carpet tape. Make sure that you have a light switch at the top of the stairs and the bottom of the stairs. If you do not have them, ask someone to add them for you. What else can I do to help prevent falls? Wear shoes that: Do not have high heels. Have  rubber bottoms. Are comfortable and fit you well. Are closed at the toe. Do not wear sandals. If you use a stepladder: Make sure that it is fully opened. Do not climb a closed stepladder. Make sure that both sides of the stepladder are locked into place. Ask someone to hold it for you, if possible. Clearly mark and make sure that you can see: Any grab bars or handrails. First and last steps. Where the edge of each step is. Use tools that help you move around (mobility aids) if they are needed. These include: Canes. Walkers. Scooters. Crutches. Turn on the lights when you go into a dark area. Replace any light bulbs as soon as they burn out. Set up your furniture so you have a clear path. Avoid moving your furniture around. If any of your floors are uneven, fix them. If there are any pets around you, be aware of where they are. Review your medicines with your doctor. Some medicines can make you feel dizzy. This can increase your chance of falling. Ask your doctor what other things that you can do to help prevent falls. This information is not intended to replace advice given to you by your health care provider. Make sure you discuss any questions you have with your health care provider. Document Released: 11/13/2008 Document Revised: 06/25/2015 Document Reviewed: 02/21/2014 Elsevier Interactive Patient Education  2017 [employer].

## 2020-11-26 ENCOUNTER — Telehealth: Payer: Self-pay

## 2020-11-26 NOTE — Telephone Encounter (Signed)
   Telephone encounter was:  Successful.  11/26/2020 Name: Wendy Robles MRN: [id number] DOB: 1930-01-20  Wendy Robles is a 87 y.o. year old female who is a primary care patient of Chevis Pretty, [place] . The community resource team was consulted for assistance with Transportation Needs   Care guide performed the following interventions:  Per [facility], pt ride has been scheduled for 11/2 appointment. Pt has been advised the same. Transportation will call 11/1 to advise pt what time her driver will be there. This has been advised to pt as well. Pt stated understanding . She will call [facility] for Medical appointment needs and 911 for emergency assistance.  Follow Up Plan:  No further follow up planned at this time. The patient has been provided with needed resources.  [place] management  [place], [place]  Main Phone: [phone]  E-mail: [email or web address]  Website: [email or web address]

## 2020-12-02 ENCOUNTER — Encounter: Payer: Self-pay | Admitting: Nurse Practitioner

## 2020-12-02 ENCOUNTER — Ambulatory Visit (INDEPENDENT_AMBULATORY_CARE_PROVIDER_SITE_OTHER): Payer: PPO | Admitting: Nurse Practitioner

## 2020-12-02 ENCOUNTER — Other Ambulatory Visit: Payer: Self-pay

## 2020-12-02 VITALS — BP 132/84 | HR 90 | Temp 98.4°F | Resp 20 | Ht 64.0 in | Wt 190.0 lb

## 2020-12-02 DIAGNOSIS — Z23 Encounter for immunization: Secondary | ICD-10-CM | POA: Diagnosis not present

## 2020-12-02 DIAGNOSIS — E785 Hyperlipidemia, unspecified: Secondary | ICD-10-CM | POA: Diagnosis not present

## 2020-12-02 DIAGNOSIS — M8588 Other specified disorders of bone density and structure, other site: Secondary | ICD-10-CM

## 2020-12-02 DIAGNOSIS — F5101 Primary insomnia: Secondary | ICD-10-CM

## 2020-12-02 DIAGNOSIS — I10 Essential (primary) hypertension: Secondary | ICD-10-CM | POA: Diagnosis not present

## 2020-12-02 DIAGNOSIS — G609 Hereditary and idiopathic neuropathy, unspecified: Secondary | ICD-10-CM | POA: Diagnosis not present

## 2020-12-02 DIAGNOSIS — Z6829 Body mass index (BMI) 29.0-29.9, adult: Secondary | ICD-10-CM | POA: Diagnosis not present

## 2020-12-02 MED ORDER — OMEGA-3-ACID ETHYL ESTERS 1 G PO CAPS: 2.0000 | ORAL_CAPSULE | Freq: Two times a day (BID) | ORAL | 1 refills | Status: DC

## 2020-12-02 MED ORDER — ROSUVASTATIN CALCIUM 20 MG PO TABS: 20.0000 mg | ORAL_TABLET | Freq: Every day | ORAL | 1 refills | Status: DC

## 2020-12-02 NOTE — Progress Notes (Signed)
Subjective:    Patient ID: Wendy Robles, female    DOB: 12-Jul-1929, 87 y.o.   MRN: [id number]  Chief Complaint: Medical Management of Chronic Issues    HPI:  1. Primary hypertension No c/o chest pain, sob or headache. Does check her blood pressure at home. Blood pressure at home [id number] systolic. BP Readings from Last 3 Encounters:  06/01/20 (!) 148/90  12/03/19 (!) 179/89  05/27/19 (!) 152/84     2. Hyperlipidemia with target LDL less than 100 She does try to watch her diet but is not able to do much exercise. Lab Results  Component Value Date   CHOL 132 06/01/2020   HDL 42 06/01/2020   LDLCALC 62 06/01/2020   TRIG 167 (H) 06/01/2020   CHOLHDL 3.1 06/01/2020     3. Hereditary and idiopathic peripheral neuropathy Cannot feel her feet very well. Makes her nervous because she is afraid she will fall.  4. Osteopenia of lumbar spine Last dexascan was done in 2019. Patient no longer wants to repeat.  5. Primary insomnia Sleeps good most nights 7-8 hours usually  6. BMI 29.0-29.9,adult Weigh is down 4 lbs Wt Readings from Last 3 Encounters:  12/02/20 190 lb (86.2 kg)  10/13/20 194 lb (88 kg)  06/01/20 194 lb (88 kg)   BMI Readings from Last 3 Encounters:  12/02/20 32.61 kg/m  10/13/20 33.30 kg/m  06/01/20 33.30 kg/m       Outpatient Encounter Medications as of 12/02/2020  Medication Sig   acetaminophen (TYLENOL) 650 MG CR tablet Take 650 mg by mouth every 8 (eight) hours as needed for pain.   aspirin 81 MG tablet Take 81 mg by mouth daily.   Cholecalciferol (VITAMIN D) 2000 units CAPS Take 1 capsule 2 (two) times daily by mouth.    EQ ALLERGY RELIEF, CETIRIZINE, 10 MG tablet Take 1/2 (one-half) tablet by mouth once daily   Multiple Vitamin (MULTIVITAMIN) capsule Take 1 capsule by mouth daily.   NONFORMULARY OR COMPOUNDED ITEM Apply 1 application topically as needed (Relaxing leg cream: Gnaphalium & polycephalum causticum).   omega-3 acid ethyl esters  (LOVAZA) 1 g capsule Take 2 capsules by mouth twice daily   polyethylene glycol (MIRALAX / GLYCOLAX) packet Take 17 g by mouth as needed.   rosuvastatin (CRESTOR) 20 MG tablet Take 1 tablet by mouth once daily   No facility-administered encounter medications on file as of 12/02/2020.    Past Surgical History:  Procedure Laterality Date   CARPAL TUNNEL RELEASE     HEMORRHOID SURGERY     HEMORROIDECTOMY     TOTAL ABDOMINAL HYSTERECTOMY      Family History  Problem Relation Age of Onset   Coronary artery disease Father 78   Neuropathy Father    Diabetes Father    Hypertension Father    Heart attack Father    Dementia Mother    Healthy Sister    COPD Brother    COPD Brother    COPD Brother    Diabetes Sister    Cancer Son        oral    New complaints: None today  Social history: Lives by herself. No longer drives so she does not get out much  Controlled substance contract: n/a     Review of Systems  Constitutional:  Negative for diaphoresis.  Eyes:  Negative for pain.  Respiratory:  Negative for shortness of breath.   Cardiovascular:  Negative for chest pain, palpitations and leg swelling.  Gastrointestinal:  Negative for abdominal pain.  Endocrine: Negative for polydipsia.  Skin:  Negative for rash.  Neurological:  Negative for dizziness, weakness and headaches.  Hematological:  Does not bruise/bleed easily.  All other systems reviewed and are negative.     Objective:   Physical Exam Vitals and nursing note reviewed.  Constitutional:      General: She is not in acute distress.    Appearance: Normal appearance. She is well-developed.  HENT:     Head: Normocephalic.     Right Ear: Tympanic membrane normal.     Left Ear: Tympanic membrane normal.     Nose: Nose normal.     Mouth/Throat:     Mouth: Mucous membranes are moist.  Eyes:     Pupils: Pupils are equal, round, and reactive to light.  Neck:     Vascular: No carotid bruit or JVD.   Cardiovascular:     Rate and Rhythm: Normal rate and regular rhythm.     Heart sounds: Normal heart sounds.  Pulmonary:     Effort: Pulmonary effort is normal. No respiratory distress.     Breath sounds: Normal breath sounds. No wheezing or rales.  Chest:     Chest wall: No tenderness.  Abdominal:     General: Bowel sounds are normal. There is no distension or abdominal bruit.     Palpations: Abdomen is soft. There is no hepatomegaly, splenomegaly, mass or pulsatile mass.     Tenderness: There is no abdominal tenderness.  Musculoskeletal:        General: Normal range of motion.     Cervical back: Normal range of motion and neck supple.     Comments: Sitting in wheel chair  Lymphadenopathy:     Cervical: No cervical adenopathy.  Skin:    General: Skin is warm and dry.  Neurological:     Mental Status: She is alert and oriented to person, place, and time.     Deep Tendon Reflexes: Reflexes are normal and symmetric.  Psychiatric:        Behavior: Behavior normal.        Thought Content: Thought content normal.        Judgment: Judgment normal.     BP 132/84 Comment: home  Pulse 90   Temp 98.4 F (36.9 C) (Temporal)   Resp 20   Ht $R'5\' 4"'Se$  (1.626 m)   Wt 190 lb (86.2 kg)   SpO2 96%   BMI 32.61 kg/m       Assessment & Plan:  Wendy Robles comes in today with chief complaint of Medical Management of Chronic Issues   Diagnosis and orders addressed:  1. Primary hypertension Low sodium diet  2. Hyperlipidemia with target LDL less than 100 Low fat diet - rosuvastatin (CRESTOR) 20 MG tablet; Take 1 tablet (20 mg total) by mouth daily.  Dispense: 90 tablet; Refill: 1 - omega-3 acid ethyl esters (LOVAZA) 1 g capsule; Take 2 capsules (2 g total) by mouth 2 (two) times daily.  Dispense: 360 capsule; Refill: 1  3. Hereditary and idiopathic peripheral neuropathy Do not go barefooted Check feet daily for any sores or lesions  4. Osteopenia of lumbar spine Weight bearing  exercise when feel safe to do so  5. Primary insomnia Bedtime routine  6. BMI 29.0-29.9,adult Discussed diet and exercise for person with BMI >25 Will recheck weight in 3-6 months  Orders Placed This Encounter  Procedures   CBC with Differential/Platelet   CMP14+EGFR   Lipid panel  Labs pending Health Maintenance reviewed Diet and exercise encouraged  Follow up plan: 6 months   Mary-Margaret Hassell Done, FNP

## 2020-12-02 NOTE — Patient Instructions (Signed)

## 2020-12-03 LAB — CMP14+EGFR
ALT: 18 IU/L (ref 0–32)
AST: 23 IU/L (ref 0–40)
Albumin/Globulin Ratio: 1.8 (ref 1.2–2.2)
Albumin: 4.6 g/dL (ref 3.6–4.6)
Alkaline Phosphatase: 48 IU/L (ref 44–121)
BUN/Creatinine Ratio: 31 — ABNORMAL HIGH (ref 12–28)
BUN: 19 mg/dL (ref 8–27)
Bilirubin Total: 0.3 mg/dL (ref 0.0–1.2)
CO2: 27 mmol/L (ref 20–29)
Calcium: 10 mg/dL (ref 8.7–10.3)
Chloride: 101 mmol/L (ref 96–106)
Creatinine, Ser: 0.61 mg/dL (ref 0.57–1.00)
Globulin, Total: 2.6 g/dL (ref 1.5–4.5)
Glucose: 103 mg/dL — ABNORMAL HIGH (ref 70–99)
Potassium: 4.3 mmol/L (ref 3.5–5.2)
Sodium: 143 mmol/L (ref 134–144)
Total Protein: 7.2 g/dL (ref 6.0–8.5)
eGFR: 85 mL/min/{1.73_m2} (ref >59)

## 2020-12-03 LAB — CBC WITH DIFFERENTIAL/PLATELET
Basophils Absolute: 0 10*3/uL (ref 0.0–0.2)
Basos: 1 %
EOS (ABSOLUTE): 0.2 10*3/uL (ref 0.0–0.4)
Eos: 4 %
Hematocrit: 40 % (ref 34.0–46.6)
Hemoglobin: 13.3 g/dL (ref 11.1–15.9)
Immature Grans (Abs): 0 10*3/uL (ref 0.0–0.1)
Immature Granulocytes: 0 %
Lymphocytes Absolute: 1.4 10*3/uL (ref 0.7–3.1)
Lymphs: 29 %
MCH: 30.4 pg (ref 26.6–33.0)
MCHC: 33.3 g/dL (ref 31.5–35.7)
MCV: 92 fL (ref 79–97)
Monocytes Absolute: 0.4 10*3/uL (ref 0.1–0.9)
Monocytes: 9 %
Neutrophils Absolute: 2.8 10*3/uL (ref 1.4–7.0)
Neutrophils: 57 %
Platelets: 213 10*3/uL (ref 150–450)
RBC: 4.37 x10E6/uL (ref 3.77–5.28)
RDW: 12 % (ref 11.7–15.4)
WBC: 4.8 10*3/uL (ref 3.4–10.8)

## 2020-12-03 LAB — LIPID PANEL
Chol/HDL Ratio: 3.2 ratio (ref 0.0–4.4)
Cholesterol, Total: 126 mg/dL (ref 100–199)
HDL: 40 mg/dL (ref >39)
LDL Chol Calc (NIH): 58 mg/dL (ref 0–99)
Triglycerides: 168 mg/dL — ABNORMAL HIGH (ref 0–149)
VLDL Cholesterol Cal: 28 mg/dL (ref 5–40)

## 2021-02-19 ENCOUNTER — Ambulatory Visit (INDEPENDENT_AMBULATORY_CARE_PROVIDER_SITE_OTHER): Payer: PPO | Admitting: Nurse Practitioner

## 2021-02-19 ENCOUNTER — Encounter: Payer: Self-pay | Admitting: Nurse Practitioner

## 2021-02-19 DIAGNOSIS — N3000 Acute cystitis without hematuria: Secondary | ICD-10-CM

## 2021-02-19 DIAGNOSIS — R300 Dysuria: Secondary | ICD-10-CM

## 2021-02-19 LAB — MICROSCOPIC EXAMINATION
Epithelial Cells (non renal): NONE SEEN /hpf (ref 0–10)
WBC, UA: >30 /hpf — AB (ref 0–5)

## 2021-02-19 LAB — URINALYSIS, COMPLETE
Bilirubin, UA: NEGATIVE
Glucose, UA: NEGATIVE
Ketones, UA: NEGATIVE
Nitrite, UA: NEGATIVE
Specific Gravity, UA: 1.02 (ref 1.005–1.030)
Urobilinogen, Ur: 0.2 mg/dL (ref 0.2–1.0)
pH, UA: 6.5 (ref 5.0–7.5)

## 2021-02-19 MED ORDER — CEPHALEXIN 500 MG PO CAPS: 500.0000 mg | ORAL_CAPSULE | Freq: Two times a day (BID) | ORAL | 0 refills | Status: DC

## 2021-02-19 NOTE — Progress Notes (Signed)
Virtual Visit  Note Due to COVID-19 pandemic this visit was conducted virtually. This visit type was conducted due to national recommendations for restrictions regarding the COVID-19 Pandemic (e.g. social distancing, sheltering in place) in an effort to limit this patient's exposure and mitigate transmission in our community. All issues noted in this document were discussed and addressed.  A physical exam was not performed with this format.  I connected with Wendy Robles on 02/19/21 at 11:16 by telephone and verified that I am speaking with the correct person using two identifiers. Wendy Robles is currently located at home and no one is currently with her during visit. The provider, Mary-Margaret Hassell Done, FNP is located in their office at time of visit.  I discussed the limitations, risks, security and privacy concerns of performing an evaluation and management service by telephone and the availability of in person appointments. I also discussed with the patient that there may be a patient responsible charge related to this service. The patient expressed understanding and agreed to proceed.   History and Present Illness:  Urinary Tract Infection  This is a new problem. The current episode started 1 to 4 weeks ago. The problem occurs intermittently. The problem has been unchanged. The quality of the pain is described as burning. The pain is at a severity of 6/10. The pain is moderate. There has been no fever. She is Not sexually active. There is No history of pyelonephritis. Associated symptoms include frequency, hesitancy and urgency. Pertinent negatives include no discharge or flank pain. She has tried nothing for the symptoms. The treatment provided no relief.     Review of Systems  Genitourinary:  Positive for frequency, hesitancy and urgency. Negative for flank pain.    Observations/Objective: Alert and oriented- answers all questions appropriately No distress UA- leuk- 2+   WBC>30  Assessment and Plan: Wendy Robles in today with chief complaint of Urinary Tract Infection   1. Dysuria - Urinalysis, Complete  2. Acute cystitis without hematuria Take medication as prescribe Cotton underwear Take shower not bath Cranberry juice, yogurt Force fluids AZO over the counter X2 days Culture pending RTO prn  - Urine Culture  Meds ordered this encounter  Medications   cephALEXin (KEFLEX) 500 MG capsule    Sig: Take 1 capsule (500 mg total) by mouth 2 (two) times daily.    Dispense:  14 capsule    Refill:  0    Order Specific Question:   Supervising Provider    Answer:   Caryl Pina A [[id number]]      Follow Up Instructions: prn    I discussed the assessment and treatment plan with the patient. The patient was provided an opportunity to ask questions and all were answered. The patient agreed with the plan and demonstrated an understanding of the instructions.   The patient was advised to call back or seek an in-person evaluation if the symptoms worsen or if the condition fails to improve as anticipated.  The above assessment and management plan was discussed with the patient. The patient verbalized understanding of and has agreed to the management plan. Patient is aware to call the clinic if symptoms persist or worsen. Patient is aware when to return to the clinic for a follow-up visit. Patient educated on when it is appropriate to go to the emergency department.   Time call ended:  11:28  I provided 12 minutes of  non face-to-face time during this encounter.    Mary-Margaret Hassell Done, FNP

## 2021-02-25 LAB — URINE CULTURE

## 2021-02-26 ENCOUNTER — Telehealth: Payer: Self-pay | Admitting: Nurse Practitioner

## 2021-02-26 NOTE — Telephone Encounter (Signed)
Pt aware.

## 2021-04-02 ENCOUNTER — Ambulatory Visit (INDEPENDENT_AMBULATORY_CARE_PROVIDER_SITE_OTHER): Payer: PPO | Admitting: Family Medicine

## 2021-04-02 ENCOUNTER — Encounter: Payer: Self-pay | Admitting: Family Medicine

## 2021-04-02 DIAGNOSIS — R102 Pelvic and perineal pain: Secondary | ICD-10-CM | POA: Diagnosis not present

## 2021-04-02 NOTE — Progress Notes (Signed)
? ?Virtual Visit via telephone Note ? ?I connected with Wendy Robles on 04/02/21 at 0755 by telephone and verified that I am speaking with the correct person using two identifiers. Wendy Robles is currently located at home and patient are currently with her during visit. The provider, Fransisca Kaufmann Cosima Prentiss, MD is located in their office at time of visit. ? ?Call ended at 0806 ? ?I discussed the limitations, risks, security and privacy concerns of performing an evaluation and management service by telephone and the availability of in person appointments. I also discussed with the patient that there may be a patient responsible charge related to this service. The patient expressed understanding and agreed to proceed. ? ? ?History and Present Illness: ?Patient had a  fall 1 week ago and she was straightening pillow and fell backwards onto floor and back and head. EMS came and helped her up and assessed her and she opted not to go into ER.  She is having tailbone pain on right and then left.  It doesn't hurt when sitting but hurts a lot when she stands.  She says the pain is very bad when she stands.  She was not able to sleep tonight.  ? ?1. Pelvic pain   ? ? ?Outpatient Encounter Medications as of 04/02/2021  ?Medication Sig  ? acetaminophen (TYLENOL) 650 MG CR tablet Take 650 mg by mouth every 8 (eight) hours as needed for pain.  ? aspirin 81 MG tablet Take 81 mg by mouth daily.  ? cephALEXin (KEFLEX) 500 MG capsule Take 1 capsule (500 mg total) by mouth 2 (two) times daily.  ? Cholecalciferol (VITAMIN D) 2000 units CAPS Take 1 capsule 2 (two) times daily by mouth.   ? EQ ALLERGY RELIEF, CETIRIZINE, 10 MG tablet Take 1/2 (one-half) tablet by mouth once daily  ? Multiple Vitamin (MULTIVITAMIN) capsule Take 1 capsule by mouth daily.  ? NONFORMULARY OR COMPOUNDED ITEM Apply 1 application topically as needed (Relaxing leg cream: Gnaphalium & polycephalum causticum).  ? omega-3 acid ethyl esters (LOVAZA) 1 g capsule  Take 2 capsules (2 g total) by mouth 2 (two) times daily.  ? polyethylene glycol (MIRALAX / GLYCOLAX) packet Take 17 g by mouth as needed.  ? rosuvastatin (CRESTOR) 20 MG tablet Take 1 tablet (20 mg total) by mouth daily.  ? ?No facility-administered encounter medications on file as of 04/02/2021.  ? ? ?Review of Systems  ?Constitutional:  Negative for chills and fever.  ?Eyes:  Negative for visual disturbance.  ?Respiratory:  Negative for chest tightness and shortness of breath.   ?Cardiovascular:  Negative for chest pain and leg swelling.  ?Musculoskeletal:  Positive for arthralgias and gait problem. Negative for back pain.  ?Skin:  Negative for rash.  ?Neurological:  Negative for light-headedness and headaches.  ?Psychiatric/Behavioral:  Negative for agitation and behavioral problems.   ?All other systems reviewed and are negative. ? ?Observations/Objective: ?Patient sounds comfortable and in no acute distress ? ?Assessment and Plan: ?Problem List Items Addressed This Visit   ?None ?Visit Diagnoses   ? ? Pelvic pain    -  Primary  ? Relevant Orders  ? DG HIP UNILAT W OR W/O PELVIS 2-3 VIEWS LEFT  ? ?  ?  ?Concern for fracture vs nerve or muscle injury, told the patient that I would do a short course of steroids if we know it is not a fracture but I cannot do that if it is a fracture.  We need an x-ray first.  She says she is going to find a ride, if she cannot find a ride between now and Monday I instructed her that she could call EMS again and have her taken into make sure that there is no issue at the emergency department.  Need to make sure its not a fracture first ?Follow up plan: ?Return if symptoms worsen or fail to improve. ? ? ?  ?I discussed the assessment and treatment plan with the patient. The patient was provided an opportunity to ask questions and all were answered. The patient agreed with the plan and demonstrated an understanding of the instructions. ?  ?The patient was advised to call back or seek  an in-person evaluation if the symptoms worsen or if the condition fails to improve as anticipated. ? ?The above assessment and management plan was discussed with the patient. The patient verbalized understanding of and has agreed to the management plan. Patient is aware to call the clinic if symptoms persist or worsen. Patient is aware when to return to the clinic for a follow-up visit. Patient educated on when it is appropriate to go to the emergency department.  ? ? ?I provided 11 minutes of non-face-to-face time during this encounter. ? ? ? ?Worthy Rancher, MD ?   ?

## 2021-04-05 ENCOUNTER — Other Ambulatory Visit: Payer: Self-pay

## 2021-04-05 ENCOUNTER — Ambulatory Visit (INDEPENDENT_AMBULATORY_CARE_PROVIDER_SITE_OTHER): Payer: PPO

## 2021-04-05 DIAGNOSIS — M1612 Unilateral primary osteoarthritis, left hip: Secondary | ICD-10-CM | POA: Diagnosis not present

## 2021-04-05 DIAGNOSIS — R102 Pelvic and perineal pain: Secondary | ICD-10-CM | POA: Diagnosis not present

## 2021-04-05 MED ORDER — PREDNISONE 20 MG PO TABS: 40.0000 mg | ORAL_TABLET | Freq: Every day | ORAL | 0 refills | Status: DC

## 2021-04-06 ENCOUNTER — Telehealth: Payer: Self-pay

## 2021-04-06 NOTE — Telephone Encounter (Signed)
? ?  Telephone encounter was:  Successful.  ?04/06/2021 ?Name: Wendy Robles MRN: [id number] DOB: [birth year] ? ?Wendy Robles is a 88 y.o. year old female who is a primary care patient of Chevis Pretty, [place] . The community resource team was consulted for assistance with Transportation Needs  ? ?Care guide performed the following interventions:  Pt called in for more education on transportation. Pt has been advised going to and from [facility] facil[facility] are covered by [facility] as [employer] does not cover transportation.   Pt has been educated on [place] as well and [place] [phone]. ? ?Follow Up Plan:  No further follow up planned at this time. The patient has been provided with needed resources. ? ?Cameron Proud ?Care Guide, Embedded Care Coordination ?[facility]  Care management  ?[place], [place]  ?Main Phone: [phone]  E-mail: [email or web address]  ?Website: [email or web address] ? ? ? ?

## 2021-05-17 ENCOUNTER — Other Ambulatory Visit: Payer: Self-pay | Admitting: Nurse Practitioner

## 2021-05-17 DIAGNOSIS — J069 Acute upper respiratory infection, unspecified: Secondary | ICD-10-CM

## 2021-05-17 DIAGNOSIS — J301 Allergic rhinitis due to pollen: Secondary | ICD-10-CM

## 2021-05-27 ENCOUNTER — Telehealth: Payer: Self-pay | Admitting: Nurse Practitioner

## 2021-05-27 NOTE — Telephone Encounter (Signed)
sure

## 2021-05-27 NOTE — Telephone Encounter (Signed)
Patient notified and appt changed to televisit on 06/01/21 ?

## 2021-06-01 ENCOUNTER — Ambulatory Visit (INDEPENDENT_AMBULATORY_CARE_PROVIDER_SITE_OTHER): Payer: PPO | Admitting: Nurse Practitioner

## 2021-06-01 ENCOUNTER — Encounter: Payer: Self-pay | Admitting: Nurse Practitioner

## 2021-06-01 DIAGNOSIS — E785 Hyperlipidemia, unspecified: Secondary | ICD-10-CM

## 2021-06-01 DIAGNOSIS — Z6829 Body mass index (BMI) 29.0-29.9, adult: Secondary | ICD-10-CM

## 2021-06-01 DIAGNOSIS — I10 Essential (primary) hypertension: Secondary | ICD-10-CM

## 2021-06-01 DIAGNOSIS — F5101 Primary insomnia: Secondary | ICD-10-CM

## 2021-06-01 DIAGNOSIS — M8588 Other specified disorders of bone density and structure, other site: Secondary | ICD-10-CM

## 2021-06-01 DIAGNOSIS — G609 Hereditary and idiopathic neuropathy, unspecified: Secondary | ICD-10-CM

## 2021-06-01 MED ORDER — OMEGA-3-ACID ETHYL ESTERS 1 G PO CAPS: 2.0000 | ORAL_CAPSULE | Freq: Two times a day (BID) | ORAL | 1 refills | Status: DC

## 2021-06-01 MED ORDER — ROSUVASTATIN CALCIUM 20 MG PO TABS: 20.0000 mg | ORAL_TABLET | Freq: Every day | ORAL | 1 refills | Status: DC

## 2021-06-01 NOTE — Patient Instructions (Signed)

## 2021-06-01 NOTE — Progress Notes (Addendum)
? ?Virtual Visit  Note ?Due to COVID-19 pandemic this visit was conducted virtually. This visit type was conducted due to national recommendations for restrictions regarding the COVID-19 Pandemic (e.g. social distancing, sheltering in place) in an effort to limit this patient's exposure and mitigate transmission in our community. All issues noted in this document were discussed and addressed.  A physical exam was not performed with this format. ? ?I connected with [name] on 06/01/21 at 1:52 by telephone and verified that I am speaking with the correct person using two identifiers. Wendy Robles is currently located at home and no one is currently with her during visit. The provider, Mary-Margaret Hassell Done, FNP is located in their office at time of visit. ? ?I discussed the limitations, risks, security and privacy concerns of performing an evaluation and management service by telephone and the availability of in person appointments. I also discussed with the patient that there may be a patient responsible charge related to this service. The patient expressed understanding and agreed to proceed. ? ? ?History and Present Illness: ? ? ?Chief Complaint: No chief complaint on file. ?  ? ?HPI: ? ?Wendy Robles is a 88 y.o. who identifies as a female who was assigned female at birth.  ? ?Social history: ?Lives with: by herself ?Work history: family checks on her daily ? ? ?Comes in today for follow up of the following chronic medical issues: ? ?1. Primary hypertension ?No c/o chest pain, sob or headache. Does not check blood pressure at home. ?BP Readings from Last 3 Encounters:  ?12/02/20 132/84  ?06/01/20 (!) 148/90  ?12/03/19 (!) 179/89  ? ? ?2. Hyperlipidemia with target LDL less than 100 ?Does try to watch diet. No dedicated exercise. ?Lab Results  ?Component Value Date  ? CHOL 126 12/02/2020  ? HDL 40 12/02/2020  ? [place] 58 12/02/2020  ? TRIG 168 (H) 12/02/2020  ? CHOLHDL 3.2 12/02/2020  ? ?The ASCVD  Risk score (Arnett DK, et al., 2019) failed to calculate for the following reasons: ?  The 2019 ASCVD risk score is only valid for ages 60 to 73 ? ? ?3. Hereditary and idiopathic peripheral neuropathy ?He says she is doing fine with it right now. ? ?4. Osteopenia of lumbar spine ?Last bone density was done in 2019. We will repeat at next face to face visit. ? ?5. Primary insomnia ?Sleeps about 6-7 hours a night. Usually feels rested in morning ? ?6. BMI 29.0-29.9,adult ?No recent weight changes according to her scales. ?Wt Readings from Last 3 Encounters:  ?12/02/20 190 lb (86.2 kg)  ?10/13/20 194 lb (88 kg)  ?06/01/20 194 lb (88 kg)  ? ?BMI Readings from Last 3 Encounters:  ?12/02/20 32.61 kg/m?  ?10/13/20 33.30 kg/m?  ?06/01/20 33.30 kg/m?  ? ? ? ? ?New complaints: ?None today ? ?Allergies  ?Allergen Reactions  ? Neurontin [Gabapentin]   ? Bactrim [Sulfamethoxazole-Trimethoprim] Rash  ? ?Outpatient Encounter Medications as of 06/01/2021  ?Medication Sig  ? acetaminophen (TYLENOL) 650 MG CR tablet Take 650 mg by mouth every 8 (eight) hours as needed for pain.  ? aspirin 81 MG tablet Take 81 mg by mouth daily.  ? cetirizine (ZYRTEC) 10 MG tablet Take 1/2 (one-half) tablet by mouth once daily  ? Cholecalciferol (VITAMIN D) 2000 units CAPS Take 1 capsule 2 (two) times daily by mouth.   ? Multiple Vitamin (MULTIVITAMIN) capsule Take 1 capsule by mouth daily.  ? NONFORMULARY OR COMPOUNDED ITEM Apply 1 application topically as  needed (Relaxing leg cream: Gnaphalium & polycephalum causticum).  ? omega-3 acid ethyl esters (LOVAZA) 1 g capsule Take 2 capsules (2 g total) by mouth 2 (two) times daily.  ? polyethylene glycol (MIRALAX / GLYCOLAX) packet Take 17 g by mouth as needed.  ? rosuvastatin (CRESTOR) 20 MG tablet Take 1 tablet (20 mg total) by mouth daily.  ? [DISCONTINUED] cephALEXin (KEFLEX) 500 MG capsule Take 1 capsule (500 mg total) by mouth 2 (two) times daily.  ? [DISCONTINUED] omega-3 acid ethyl esters (LOVAZA) 1  g capsule Take 2 capsules (2 g total) by mouth 2 (two) times daily.  ? [DISCONTINUED] predniSONE (DELTASONE) 20 MG tablet Take 2 tablets (40 mg total) by mouth daily with breakfast.  ? [DISCONTINUED] rosuvastatin (CRESTOR) 20 MG tablet Take 1 tablet (20 mg total) by mouth daily.  ? ?No facility-administered encounter medications on file as of 06/01/2021.  ? ? ?Past Surgical History:  ?Procedure Laterality Date  ? CARPAL TUNNEL RELEASE    ? HEMORRHOID SURGERY    ? HEMORROIDECTOMY    ? TOTAL ABDOMINAL HYSTERECTOMY    ? ? ?Family History  ?Problem Relation Age of Onset  ? Coronary artery disease Father 48  ? Neuropathy Father   ? Diabetes Father   ? Hypertension Father   ? Heart attack Father   ? Dementia Mother   ? Healthy Sister   ? COPD Brother   ? COPD Brother   ? COPD Brother   ? Diabetes Sister   ? Cancer Son   ?     oral  ? ? ? ? ?Controlled substance contract: n/a ? ? ? ? ?Review of Systems  ?Constitutional:  Negative for diaphoresis and weight loss.  ?Eyes:  Negative for blurred vision, double vision and pain.  ?Respiratory:  Negative for shortness of breath.   ?Cardiovascular:  Negative for chest pain, palpitations, orthopnea and leg swelling.  ?Gastrointestinal:  Negative for abdominal pain.  ?Skin:  Negative for rash.  ?Neurological:  Negative for dizziness, sensory change, loss of consciousness, weakness and headaches.  ?Endo/Heme/Allergies:  Negative for polydipsia. Does not bruise/bleed easily.  ?Psychiatric/Behavioral:  Negative for memory loss. The patient does not have insomnia.   ?All other systems reviewed and are negative. ? ? ?Observations/Objective: ?Alert and oriented- answers all questions appropriately ?No distress ? ? ?Assessment and Plan: ?LUCYLLE FOULKES comes in today with chief complaint of No chief complaint on file. ? ? ?Diagnosis and orders addressed: ? ?1. Primary hypertension ?Low sodium diet ? ?2. Hyperlipidemia with target LDL less than 100 ?Low fat diet ?- rosuvastatin (CRESTOR) 20  MG tablet; Take 1 tablet (20 mg total) by mouth daily.  Dispense: 90 tablet; Refill: 1 ?- omega-3 acid ethyl esters (LOVAZA) 1 g capsule; Take 2 capsules (2 g total) by mouth 2 (two) times daily.  Dispense: 360 capsule; Refill: 1 ? ?3. Hereditary and idiopathic peripheral neuropathy ?Do not go barefooted ?Check feet daily ? ?4. Osteopenia of lumbar spine ?Weight bearing exercises if can do safely ? ?5. Primary insomnia ?Bedtime routine ? ?6. BMI 29.0-29.9,adult ?Discussed diet and exercise for person with BMI >25 ?Will recheck weight in 3-6 months ? ? ?Will draw labs at next visit- patient has difficulty with transportation ?Health Maintenance reviewed ?Diet and exercise encouraged ? ?Follow up plan: ?6 months ? ? ?I discussed the assessment and treatment plan with the patient. The patient was provided an opportunity to ask questions and all were answered. The patient agreed with the plan and  demonstrated an understanding of the instructions. ?  ?The patient was advised to call back or seek an in-person evaluation if the symptoms worsen or if the condition fails to improve as anticipated. ? ?The above assessment and management plan was discussed with the patient. The patient verbalized understanding of and has agreed to the management plan. Patient is aware to call the clinic if symptoms persist or worsen. Patient is aware when to return to the clinic for a follow-up visit. Patient educated on when it is appropriate to go to the emergency department.  ? ?Time call ended:  2:17 ? ?I provided 25 minutes of  non face-to-face time during this encounter. ? ? ? ?Mary-Margaret Hassell Done, FNP ? ? ?

## 2021-06-04 ENCOUNTER — Ambulatory Visit: Payer: PPO | Admitting: Nurse Practitioner

## 2021-08-11 ENCOUNTER — Telehealth: Payer: Self-pay

## 2021-10-12 ENCOUNTER — Encounter: Payer: Self-pay | Admitting: Nurse Practitioner

## 2021-10-12 ENCOUNTER — Ambulatory Visit (INDEPENDENT_AMBULATORY_CARE_PROVIDER_SITE_OTHER): Payer: PPO | Admitting: Nurse Practitioner

## 2021-10-12 VITALS — BP 136/67 | HR 71 | Temp 98.0°F | Resp 20

## 2021-10-12 DIAGNOSIS — E785 Hyperlipidemia, unspecified: Secondary | ICD-10-CM | POA: Diagnosis not present

## 2021-10-12 DIAGNOSIS — M79621 Pain in right upper arm: Secondary | ICD-10-CM | POA: Diagnosis not present

## 2021-10-12 DIAGNOSIS — F5101 Primary insomnia: Secondary | ICD-10-CM | POA: Diagnosis not present

## 2021-10-12 DIAGNOSIS — G609 Hereditary and idiopathic neuropathy, unspecified: Secondary | ICD-10-CM

## 2021-10-12 DIAGNOSIS — R300 Dysuria: Secondary | ICD-10-CM | POA: Diagnosis not present

## 2021-10-12 DIAGNOSIS — M8588 Other specified disorders of bone density and structure, other site: Secondary | ICD-10-CM | POA: Diagnosis not present

## 2021-10-12 DIAGNOSIS — I10 Essential (primary) hypertension: Secondary | ICD-10-CM

## 2021-10-12 DIAGNOSIS — Z6829 Body mass index (BMI) 29.0-29.9, adult: Secondary | ICD-10-CM

## 2021-10-12 DIAGNOSIS — N3000 Acute cystitis without hematuria: Secondary | ICD-10-CM

## 2021-10-12 LAB — MICROSCOPIC EXAMINATION
RBC, Urine: NONE SEEN /hpf (ref 0–2)
Renal Epithel, UA: NONE SEEN /hpf

## 2021-10-12 LAB — URINALYSIS, COMPLETE
Bilirubin, UA: NEGATIVE
Glucose, UA: NEGATIVE
Ketones, UA: NEGATIVE
Nitrite, UA: NEGATIVE
Protein,UA: NEGATIVE
Specific Gravity, UA: 1.01 (ref 1.005–1.030)
Urobilinogen, Ur: 0.2 mg/dL (ref 0.2–1.0)
pH, UA: 6 (ref 5.0–7.5)

## 2021-10-12 MED ORDER — ROSUVASTATIN CALCIUM 20 MG PO TABS: 20.0000 mg | ORAL_TABLET | Freq: Every day | ORAL | 1 refills | Status: DC

## 2021-10-12 MED ORDER — OMEGA-3-ACID ETHYL ESTERS 1 G PO CAPS: 2.0000 | ORAL_CAPSULE | Freq: Two times a day (BID) | ORAL | 1 refills | Status: DC

## 2021-10-12 MED ORDER — CEPHALEXIN 500 MG PO CAPS: 500.0000 mg | ORAL_CAPSULE | Freq: Two times a day (BID) | ORAL | 0 refills | Status: DC

## 2021-10-12 MED ORDER — PREDNISONE 20 MG PO TABS: 40.0000 mg | ORAL_TABLET | Freq: Every day | ORAL | 0 refills | Status: AC

## 2021-10-12 NOTE — Addendum Note (Signed)
Addended by: Chevis Pretty on: 10/12/2021 11:42 AM   Modules accepted: Orders

## 2021-10-12 NOTE — Progress Notes (Signed)
Subjective:    Patient ID: Wendy Robles, female    DOB: December 29, 1929, 88 y.o.   MRN: [id number]   Chief Complaint: medical management of chronic issues     HPI:  Wendy Robles is a 88 y.o. who identifies as a female who was assigned female at birth.   Social history: Lives with: by herself Work history: retired   Water engineer in today for follow up of the following chronic medical issues:  1. Primary hypertension No c/o chets pain, sob or headache. Does  check blood pressure at home.systolic runs 130 systolic. BP Readings from Last 3 Encounters:  10/12/21 136/67  12/02/20 132/84  06/01/20 (!) 148/90       2. Hyperlipidemia with target LDL less than 100 Does not watch diet nor does not exercise. Lab Results  Component Value Date   CHOL 126 12/02/2020   HDL 40 12/02/2020   LDLCALC 58 12/02/2020   TRIG 168 (H) 12/02/2020   CHOLHDL 3.2 12/02/2020     3. Hereditary and idiopathic peripheral neuropathy Burning and tingling in bil feet. Her heels are stating to burn now.  4. Osteopenia of lumbar spine Last dexascan was done on 10/12/17. Her t score was -0.3. she does no weight bearing exercises. She has aged out of dexascans.  5. Primary insomnia On no prescription meds. Sleeps about 3-4 hours. Takes tylenol at times.  6. BMI 29.0-29.9,adult No recent weight changes Wt Readings from Last 3 Encounters:  12/02/20 190 lb (86.2 kg)  10/13/20 194 lb (88 kg)  06/01/20 194 lb (88 kg)   BMI Readings from Last 3 Encounters:  12/02/20 32.61 kg/m  10/13/20 33.30 kg/m  06/01/20 33.30 kg/m     New complaints: Right arm pain. Started 3 weeks ago. She thinks she hurt it pushing her self up from chair. Pain radiates down to elbow. Laying down increases pain.  Allergies  Allergen Reactions   Neurontin [Gabapentin]    Bactrim [Sulfamethoxazole-Trimethoprim] Rash   Outpatient Encounter Medications as of 10/12/2021  Medication Sig   acetaminophen (TYLENOL) 650 MG CR  tablet Take 650 mg by mouth every 8 (eight) hours as needed for pain.   aspirin 81 MG tablet Take 81 mg by mouth daily.   cetirizine (ZYRTEC) 10 MG tablet Take 1/2 (one-half) tablet by mouth once daily   Cholecalciferol (VITAMIN D) 2000 units CAPS Take 1 capsule 2 (two) times daily by mouth.    Multiple Vitamin (MULTIVITAMIN) capsule Take 1 capsule by mouth daily.   NONFORMULARY OR COMPOUNDED ITEM Apply 1 application topically as needed (Relaxing leg cream: Gnaphalium & polycephalum causticum).   omega-3 acid ethyl esters (LOVAZA) 1 g capsule Take 2 capsules (2 g total) by mouth 2 (two) times daily.   polyethylene glycol (MIRALAX / GLYCOLAX) packet Take 17 g by mouth as needed.   rosuvastatin (CRESTOR) 20 MG tablet Take 1 tablet (20 mg total) by mouth daily.   No facility-administered encounter medications on file as of 10/12/2021.    Past Surgical History:  Procedure Laterality Date   CARPAL TUNNEL RELEASE     HEMORRHOID SURGERY     HEMORROIDECTOMY     TOTAL ABDOMINAL HYSTERECTOMY      Family History  Problem Relation Age of Onset   Coronary artery disease Father 62   Neuropathy Father    Diabetes Father    Hypertension Father    Heart attack Father    Dementia Mother    Healthy Sister    COPD Brother  COPD Brother    COPD Brother    Diabetes Sister    Cancer Son        oral      Controlled substance contract: n/a     Review of Systems  Constitutional:  Negative for diaphoresis.  Eyes:  Negative for pain.  Respiratory:  Negative for shortness of breath.   Cardiovascular:  Negative for chest pain, palpitations and leg swelling.  Gastrointestinal:  Negative for abdominal pain.  Endocrine: Negative for polydipsia.  Musculoskeletal:  Positive for arthralgias (right upper arm).  Skin:  Negative for rash.  Neurological:  Negative for dizziness, weakness and headaches.  Hematological:  Does not bruise/bleed easily.  All other systems reviewed and are negative.       Objective:   Physical Exam Vitals and nursing note reviewed.  Constitutional:      General: She is not in acute distress.    Appearance: Normal appearance. She is well-developed.  HENT:     Head: Normocephalic.     Right Ear: Tympanic membrane normal.     Left Ear: Tympanic membrane normal.     Nose: Nose normal.     Mouth/Throat:     Mouth: Mucous membranes are moist.  Eyes:     Pupils: Pupils are equal, round, and reactive to light.  Neck:     Vascular: No carotid bruit or JVD.  Cardiovascular:     Rate and Rhythm: Normal rate and regular rhythm.     Heart sounds: Normal heart sounds.  Pulmonary:     Effort: Pulmonary effort is normal. No respiratory distress.     Breath sounds: Normal breath sounds. No wheezing or rales.  Chest:     Chest wall: No tenderness.  Abdominal:     General: Bowel sounds are normal. There is no distension or abdominal bruit.     Palpations: Abdomen is soft. There is no hepatomegaly, splenomegaly, mass or pulsatile mass.     Tenderness: There is no abdominal tenderness.  Musculoskeletal:        General: Normal range of motion.     Cervical back: Normal range of motion and neck supple.     Right lower leg: Edema (1+) present.     Left lower leg: Edema (1+) present.     Comments: Right upper arm pain on palpation FROM of right shoulder Grips equal bil.  Lymphadenopathy:     Cervical: No cervical adenopathy.  Skin:    General: Skin is warm and dry.  Neurological:     Mental Status: She is alert and oriented to person, place, and time.     Deep Tendon Reflexes: Reflexes are normal and symmetric.  Psychiatric:        Behavior: Behavior normal.        Thought Content: Thought content normal.        Judgment: Judgment normal.     BP 136/67   Pulse 71   Temp 98 F (36.7 C) (Temporal)   Resp 20   SpO2 96%        Assessment & Plan:   SCOUT GUYETT comes in today with chief complaint of Medical Management of Chronic Issues (Right  arm pain)   Diagnosis and orders addressed:  1. Primary hypertension Low sodium diet - CBC with Differential/Platelet - CMP14+EGFR  2. Hyperlipidemia with target LDL less than 100 Low fat diet - rosuvastatin (CRESTOR) 20 MG tablet; Take 1 tablet (20 mg total) by mouth daily.  Dispense: 90 tablet; Refill:  1 - omega-3 acid ethyl esters (LOVAZA) 1 g capsule; Take 2 capsules (2 g total) by mouth 2 (two) times daily.  Dispense: 360 capsule; Refill: 1 - Lipid panel  3. Hereditary and idiopathic peripheral neuropathy Do not go barefooted  4. Osteopenia of lumbar spine Weight bearing exercises when can tolerate  5. Primary insomnia Bedtime routine  6. BMI 29.0-29.9,adult Discussed diet and exercise for person with BMI >25 Will recheck weight in 3-6 months   7. Dysuria  - Urinalysis, Complete  8. Acute cystitis Keflex $RemoveBeforeD'500mg'[id number]$  1 po bid for 7 days  9. Pain in right upper arm TENS unit to upper arm Ice if helps - predniSONE (DELTASONE) 20 MG tablet; Take 2 tablets (40 mg total) by mouth daily with breakfast for 5 days. 2 po daily for 5 days  Dispense: 10 tablet; Refill: 0   Labs pending Health Maintenance reviewed Diet and exercise encouraged  Follow up plan: 6 months   Mary-Margaret Hassell Done, FNP

## 2021-10-13 LAB — CBC WITH DIFFERENTIAL/PLATELET
Basophils Absolute: 0 10*3/uL (ref 0.0–0.2)
Basos: 1 %
EOS (ABSOLUTE): 0.2 10*3/uL (ref 0.0–0.4)
Eos: 3 %
Hematocrit: 41.5 % (ref 34.0–46.6)
Hemoglobin: 13.7 g/dL (ref 11.1–15.9)
Immature Grans (Abs): 0 10*3/uL (ref 0.0–0.1)
Immature Granulocytes: 0 %
Lymphocytes Absolute: 2 10*3/uL (ref 0.7–3.1)
Lymphs: 33 %
MCH: 30.6 pg (ref 26.6–33.0)
MCHC: 33 g/dL (ref 31.5–35.7)
MCV: 93 fL (ref 79–97)
Monocytes Absolute: 0.5 10*3/uL (ref 0.1–0.9)
Monocytes: 8 %
Neutrophils Absolute: 3.3 10*3/uL (ref 1.4–7.0)
Neutrophils: 55 %
Platelets: 205 10*3/uL (ref 150–450)
RBC: 4.47 x10E6/uL (ref 3.77–5.28)
RDW: 12.6 % (ref 11.7–15.4)
WBC: 5.9 10*3/uL (ref 3.4–10.8)

## 2021-10-13 LAB — CMP14+EGFR
ALT: 17 IU/L (ref 0–32)
AST: 19 IU/L (ref 0–40)
Albumin/Globulin Ratio: 1.9 (ref 1.2–2.2)
Albumin: 4.7 g/dL — ABNORMAL HIGH (ref 3.6–4.6)
Alkaline Phosphatase: 48 IU/L (ref 44–121)
BUN/Creatinine Ratio: 28 (ref 12–28)
BUN: 15 mg/dL (ref 10–36)
Bilirubin Total: 0.3 mg/dL (ref 0.0–1.2)
CO2: 25 mmol/L (ref 20–29)
Calcium: 9.9 mg/dL (ref 8.7–10.3)
Chloride: 100 mmol/L (ref 96–106)
Creatinine, Ser: 0.53 mg/dL — ABNORMAL LOW (ref 0.57–1.00)
Globulin, Total: 2.5 g/dL (ref 1.5–4.5)
Glucose: 98 mg/dL (ref 70–99)
Potassium: 4 mmol/L (ref 3.5–5.2)
Sodium: 141 mmol/L (ref 134–144)
Total Protein: 7.2 g/dL (ref 6.0–8.5)
eGFR: 88 mL/min/{1.73_m2} (ref >59)

## 2021-10-13 LAB — LIPID PANEL
Chol/HDL Ratio: 2.8 ratio (ref 0.0–4.4)
Cholesterol, Total: 128 mg/dL (ref 100–199)
HDL: 46 mg/dL (ref >39)
LDL Chol Calc (NIH): 60 mg/dL (ref 0–99)
Triglycerides: 127 mg/dL (ref 0–149)
VLDL Cholesterol Cal: 22 mg/dL (ref 5–40)

## 2021-10-15 LAB — URINE CULTURE

## 2021-11-10 ENCOUNTER — Encounter: Payer: Self-pay | Admitting: Nurse Practitioner

## 2021-11-10 ENCOUNTER — Ambulatory Visit (INDEPENDENT_AMBULATORY_CARE_PROVIDER_SITE_OTHER): Payer: PPO | Admitting: Nurse Practitioner

## 2021-11-10 DIAGNOSIS — R600 Localized edema: Secondary | ICD-10-CM

## 2021-11-10 MED ORDER — FUROSEMIDE 20 MG PO TABS: 10.0000 mg | ORAL_TABLET | Freq: Every day | ORAL | 0 refills | Status: DC | PRN

## 2021-11-10 NOTE — Progress Notes (Signed)
   Virtual Visit  Note Due to COVID-19 pandemic this visit was conducted virtually. This visit type was conducted due to national recommendations for restrictions regarding the COVID-19 Pandemic (e.g. social distancing, sheltering in place) in an effort to limit this patient's exposure and mitigate transmission in our community. All issues noted in this document were discussed and addressed.  A physical exam was not performed with this format.  I connected with Wendy Robles on 11/10/21 at 12;45 am  by telephone and verified that I am speaking with the correct person using two identifiers. Wendy Robles is currently located at home during visit. The provider, Ivy Lynn, NP is located in their office at time of visit.  I discussed the limitations, risks, security and privacy concerns of performing an evaluation and management service by telephone and the availability of in person appointments. I also discussed with the patient that there may be a patient responsible charge related to this service. The patient expressed understanding and agreed to proceed.   History and Present Illness:  HPI Edema: Patient complains of edema. The location of the edema is lower leg(s) bilateral, feet bilateral.  The edema has been severe.  Onset of symptoms was a few weeks ago, unchanged since that time. The edema is present all day. The patient states never.  The swelling has been aggravated by nothing, relieved by nothing, and been associated with nothing. Cardiac risk factors include advanced age (older than 39 for men, 83 for women).    Review of Systems  Constitutional: Negative.  Negative for chills and fever.  HENT: Negative.    Eyes: Negative.   Respiratory: Negative.    Cardiovascular:  Positive for leg swelling.  Genitourinary: Negative.   Musculoskeletal:  Positive for joint pain.       Ankle swelling and pain  Skin: Negative.   All other systems reviewed and are  negative.    Observations/Objective: Televisit patient not in distress.  Assessment and Plan: Patient presents with edema bilateral lower extremity.  Advised patient to come to clinic for assessment but she is unable to come to clinic.  Patient reports legs are swollen and almost weeping.  Moderate to severe pain.  Advised patient to elevate foot, compression socks, 10 mg furosemide daily as needed for 3 to 5 days.  Follow-up with worsening or unresolved symptoms.  Follow Up Instructions: Follow-up for worsening or resolved symptoms    I discussed the assessment and treatment plan with the patient. The patient was provided an opportunity to ask questions and all were answered. The patient agreed with the plan and demonstrated an understanding of the instructions.   The patient was advised to call back or seek an in-person evaluation if the symptoms worsen or if the condition fails to improve as anticipated.  The above assessment and management plan was discussed with the patient. The patient verbalized understanding of and has agreed to the management plan. Patient is aware to call the clinic if symptoms persist or worsen. Patient is aware when to return to the clinic for a follow-up visit. Patient educated on when it is appropriate to go to the emergency department.   Time call ended:  12:5  I provided 13 minutes of  non face-to-face time during this encounter.    Ivy Lynn, NP

## 2021-11-18 ENCOUNTER — Telehealth: Payer: Self-pay | Admitting: Nurse Practitioner

## 2021-11-18 DIAGNOSIS — Z748 Other problems related to care provider dependency: Secondary | ICD-10-CM

## 2021-11-18 DIAGNOSIS — M25511 Pain in right shoulder: Secondary | ICD-10-CM

## 2021-11-18 NOTE — Telephone Encounter (Signed)
Please do referral for ortho

## 2021-11-18 NOTE — Telephone Encounter (Signed)
Patient notified and verbalized understanding. Referral placed to ortho and also to CCM to assist with transportation needs

## 2021-11-23 ENCOUNTER — Other Ambulatory Visit: Payer: Self-pay | Admitting: Nurse Practitioner

## 2021-11-23 DIAGNOSIS — R600 Localized edema: Secondary | ICD-10-CM

## 2021-11-25 ENCOUNTER — Telehealth: Payer: Self-pay | Admitting: Nurse Practitioner

## 2021-11-25 NOTE — Telephone Encounter (Signed)
Ortho will do her imaging when she is seen

## 2021-11-26 ENCOUNTER — Telehealth: Payer: Self-pay | Admitting: *Deleted

## 2021-11-26 NOTE — Telephone Encounter (Signed)
   Telephone encounter was:  Successful.  11/26/2021 Name: [name] MRN: [id number] DOB: Aug 09, 1929  Wendy Robles is a 88 y.o. year old female who is a primary care patient of Chevis Pretty, [place] . The community resource team was consulted for assistance with Transportation Needs   Care guide performed the following interventions: Patient provided with information about care guide support team and interviewed to confirm resource needs. [facility] called back and confirmed appt for 12/01/2021 Follow Up Plan:  No further follow up planned at this time. The patient has been provided with needed resources. [place] [phone] [address] , [place] [address] Email : Ashby Dawes. [email or web address]

## 2021-11-26 NOTE — Telephone Encounter (Signed)
   Telephone encounter was:  Successful.  11/26/2021 Name: [name] MRN: [id number] DOB: Sep 04, 1929  Wendy Robles is a 88 y.o. year old female who is a primary care patient of Chevis Pretty, [place] . The community resource team was consulted for assistance with Transportation Needs  talked with patient and she  is registered with [facility] in [place] will try to schedule through them but last resort will use thn  Care guide performed the following interventions: Patient provided with information about care guide support team and interviewed to confirm resource needs Discussed resources to assist with Transportation .  Follow Up Plan:  Care guide will follow up with patient by phone over the next day [place] [phone] [address] , [place] [address] Email : Ashby Dawes. [email or web address]-moran [email or web address]

## 2021-11-26 NOTE — Telephone Encounter (Signed)
Please let patient know we are trying to help her with transportation. Ortho will order the test they need ofr her shoulder

## 2021-11-26 NOTE — Telephone Encounter (Signed)
Pt states that she has an appt next Wed and has transportation.

## 2021-11-26 NOTE — Patient Outreach (Addendum)
  Care Coordination   Multidisciplinary Case Review Note    11/26/2021 Name: Wendy Robles MRN: [id number] DOB: 1929/03/14  Wendy Robles is a 88 y.o. year old female who sees Chevis Pretty, FNP for primary care.    Goals Addressed             This Visit's Progress    Care Coordination Services       Care Coordination Interventions: Outreached by [facility] Triage nurse regarding transportation needs. Patient has an ortho appt on 12/01/21 in [place] and needs transportation assistance. Patient called the office this morning concerned that she may need to cancel the appt.  Chart reviewed including upcoming appointments. Appt with Dr Luna Glasgow on 12/01/21. Appt notes stated that patient has transportation arranged through [facility]. No chart notes indicating transportation was arranged and it seems patient is unaware.  Reviewed referrals and reached out to scheduling care guide, Noreene Larsson, regarding referral placed on 11/18/21 or [facility]CM services for transportation assistance. Collaborated with [employer] and she will forward to community care guides for assistance with verifying transportation.  Patient would benefit from [place]. Will ask scheduling care guide to reach out to patient and schedule a telephone visit with Jackelyn Poling, RN Care Coordinator  or Nat Christen, LCSW       SDOH assessments and interventions completed:  No     Care Coordination Interventions Activated:  Yes   Care Coordination Interventions:  Yes, provided   Follow up plan: Referral made to [place]    Multidisciplinary Team Attendees:   Felicity Coyer, LPN [facility]) Noreene Larsson, [place] (Care Guide)  Scribe for Multidisciplinary Case Review:   Chong Sicilian, BSN, RN-BC RN Care Coordinator [place]: [phone] Main #: [phone]

## 2021-11-26 NOTE — Telephone Encounter (Signed)
Patient is aware.  She is concerned about getting to the appointment because she cannot drive there.  Her daughter lives in [place] and is unable to come up to help because her husband is sick now and she cannot leave him.  At one time, [facility] had a program that helped patients like her with transportation to their appointments, but they have stopped it.  I have reached out to [facility] to see if there is anything [facility] can do to help her get to the appointment and she is going to find out if they can help her.

## 2021-11-29 ENCOUNTER — Telehealth: Payer: Self-pay | Admitting: *Deleted

## 2021-11-29 ENCOUNTER — Other Ambulatory Visit: Payer: Self-pay | Admitting: Nurse Practitioner

## 2021-11-29 MED ORDER — DICLOFENAC SODIUM 1 % EX GEL: 4.0000 g | Freq: Four times a day (QID) | CUTANEOUS | 1 refills | Status: AC

## 2021-11-29 NOTE — Telephone Encounter (Signed)
   Telephone encounter was:  Successful.  11/29/2021 Name: [name] MRN: [id number] DOB: 04-Dec-1929  GAIGE FUSSNER is a 88 y.o. year old female who is a primary care patient of Chevis Pretty, [place] . The community resource team was consulted for assistance with  Patient called and was concerned about approval with ct of shoulder explained appt established with Dr Luna Glasgow on 12/01/2021, patient is in pain and not sleeping with shoulder discomfort and so explained ortho visit will order ct if needed , patient provided a gel from Dr after reaching out through secure chat for symptom relief    Care guide performed the following interventions: Follow up call placed to the patient to discuss status of referral.  Follow Up Plan:  No further follow up planned at this time. The patient has been provided with needed resources.  [place] [phone] [address] , [place] [address] Email : Ashby [email or web address]

## 2021-11-29 NOTE — Progress Notes (Signed)
voltaren

## 2021-12-01 ENCOUNTER — Ambulatory Visit (INDEPENDENT_AMBULATORY_CARE_PROVIDER_SITE_OTHER): Payer: PPO

## 2021-12-01 ENCOUNTER — Ambulatory Visit (INDEPENDENT_AMBULATORY_CARE_PROVIDER_SITE_OTHER): Payer: PPO | Admitting: Orthopaedic Surgery

## 2021-12-01 ENCOUNTER — Encounter: Payer: Self-pay | Admitting: Orthopaedic Surgery

## 2021-12-01 VITALS — BP 221/114 | HR 85 | Ht 64.0 in | Wt 190.0 lb

## 2021-12-01 DIAGNOSIS — M25511 Pain in right shoulder: Secondary | ICD-10-CM

## 2021-12-01 DIAGNOSIS — G8929 Other chronic pain: Secondary | ICD-10-CM | POA: Diagnosis not present

## 2021-12-01 MED ORDER — METHYLPREDNISOLONE ACETATE 40 MG/ML IJ SUSP
40.0000 mg | Freq: Once | INTRAMUSCULAR | Status: AC
  Administered 2021-12-01: 40 mg via INTRA_ARTICULAR

## 2021-12-01 NOTE — Patient Instructions (Signed)
Try icing your shoulder. Ice will work better than heat.   As the weather changes and gets cooler, you may notice you are affected more. You may have more pain in your joints. This is normal. Dress warmly and make sure that area is covered well.

## 2021-12-01 NOTE — Progress Notes (Signed)
Subjective:    Patient ID: Wendy Robles, female    DOB: 1929-03-01, 88 y.o.   MRN: [id number]  HPI She has had right shoulder pain for about three months getting progressively worse.  She has no trauma. She has no swelling.  She has pain when lifting her arm over head and now she has great difficulty in even trying that.  She has occasional numbness of both hands.  She has no neck pain and no left shoulder pain.  She uses a wheelchair often.   Review of Systems  Constitutional:  Positive for activity change.  Musculoskeletal:  Positive for arthralgias and myalgias.  All other systems reviewed and are negative. For Review of Systems, all other systems reviewed and are negative.  The following is a summary of the past history medically, past history surgically, known current medicines, social history and family history.  This information is gathered electronically by the computer from prior information and documentation.  I review this each visit and have found including this information at this point in the chart is beneficial and informative.   Past Medical History:  Diagnosis Date   Arthritis    Colon polyps    Diverticulosis    Dyslipidemia    HTN (hypertension)    White coat   Hyperlipidemia    Osteoporosis     Past Surgical History:  Procedure Laterality Date   CARPAL TUNNEL RELEASE     HEMORRHOID SURGERY     HEMORROIDECTOMY     TOTAL ABDOMINAL HYSTERECTOMY      Current Outpatient Medications on File Prior to Visit  Medication Sig Dispense Refill   acetaminophen (TYLENOL) 650 MG CR tablet Take 650 mg by mouth every 8 (eight) hours as needed for pain.     aspirin 81 MG tablet Take 81 mg by mouth daily.     CALCIUM PO Take by mouth.     cetirizine (ZYRTEC) 10 MG tablet Take 1/2 (one-half) tablet by mouth once daily 45 tablet 1   Cholecalciferol (VITAMIN D) 2000 units CAPS Take 1 capsule 2 (two) times daily by mouth.      diclofenac Sodium (VOLTAREN) 1 % GEL Apply 4 g  topically 4 (four) times daily. 350 g 1   furosemide (LASIX) 20 MG tablet TAKE 1/2 (ONE-HALF) TABLET BY MOUTH ONCE DAILY AS NEEDED FOR EDEMA 5 tablet 0   Multiple Vitamin (MULTIVITAMIN) capsule Take 1 capsule by mouth daily.     NONFORMULARY OR COMPOUNDED ITEM Apply 1 application topically as needed (Relaxing leg cream: Gnaphalium & polycephalum causticum).     omega-3 acid ethyl esters (LOVAZA) 1 g capsule Take 2 capsules (2 g total) by mouth 2 (two) times daily. 360 capsule 1   polyethylene glycol (MIRALAX / GLYCOLAX) packet Take 17 g by mouth as needed.     rosuvastatin (CRESTOR) 20 MG tablet Take 1 tablet (20 mg total) by mouth daily. 90 tablet 1   cephALEXin (KEFLEX) 500 MG capsule Take 1 capsule (500 mg total) by mouth 2 (two) times daily. (Patient not taking: Reported on 12/01/2021) 14 capsule 0   No current facility-administered medications on file prior to visit.    Social History   Socioeconomic History   Marital status: Widowed    Spouse name: Not on file   Number of children: 2   Years of education: 7   Highest education level: 7th grade  Occupational History    Employer: RETIRED  Tobacco Use   Smoking status: Never  Smokeless tobacco: Never  Vaping Use   Vaping Use: Never used  Substance and Sexual Activity   Alcohol use: No   Drug use: No   Sexual activity: Not Currently  Other Topics Concern   Not on file  Social History Narrative   Son passed away from cancer in 04-18-14   Daughter lives in [place], [place]   Sister in [place], but has lots of health problems   She has a friend who helps her clean her home and shops for her once per week   Her cousin lives next door and checks mail and checks on her daily.   Social Determinants of Health   Financial Resource Strain: Low Risk  (10/13/2020)   Overall Financial Resource Strain (CARDIA)    Difficulty of Paying Living Expenses: Not hard at all  Food Insecurity: No Food Insecurity (10/13/2020)   Hunger Vital Sign     Worried About Running Out of Food in the Last Year: Never true    Ran Out of Food in the Last Year: Never true  Transportation Needs: Unmet Transportation Needs (10/13/2020)   PRAPARE - Transportation    Lack of Transportation (Medical): Yes    Lack of Transportation (Non-Medical): Yes  Physical Activity: Inactive (10/13/2020)   Exercise Vital Sign    Days of Exercise per Week: 0 days    Minutes of Exercise per Session: 0 min  Stress: Stress Concern Present (10/13/2020)   [place]    Feeling of Stress : To some extent  Social Connections: Socially Isolated (10/13/2020)   Social Connection and Isolation Panel [NHANES]    Frequency of Communication with Friends and Family: More than three times a week    Frequency of Social Gatherings with Friends and Family: Once a week    Attends Religious Services: Never    Marine scientist or Organizations: No    Attends Archivist Meetings: Never    Marital Status: Widowed  Intimate Partner Violence: Not At Risk (10/13/2020)   Humiliation, Afraid, Rape, and Kick questionnaire    Fear of Current or Ex-Partner: No    Emotionally Abused: No    Physically Abused: No    Sexually Abused: No    Family History  Problem Relation Age of Onset   Coronary artery disease Father 48   Neuropathy Father    Diabetes Father    Hypertension Father    Heart attack Father    Dementia Mother    Healthy Sister    COPD Brother    COPD Brother    COPD Brother    Diabetes Sister    Cancer Son        oral    BP (!) 221/114 Comment: has much better readings at home  Pulse 85   Ht '5\' 4"'$  (1.626 m)   Wt 190 lb (86.2 kg)   BMI 32.61 kg/m   Body mass index is 32.61 kg/m.      Objective:   Physical Exam Vitals and nursing note reviewed. Exam conducted with a chaperone present.  Constitutional:      Appearance: She is well-developed.  HENT:     Head: Normocephalic and  atraumatic.  Eyes:     Conjunctiva/sclera: Conjunctivae normal.     Pupils: Pupils are equal, round, and reactive to light.  Cardiovascular:     Rate and Rhythm: Normal rate and regular rhythm.  Pulmonary:     Effort: Pulmonary effort is normal.  Abdominal:     Palpations: Abdomen is soft.  Musculoskeletal:       Arms:     Cervical back: Normal range of motion and neck supple.  Skin:    General: Skin is warm and dry.  Neurological:     Mental Status: She is alert and oriented to person, place, and time.     Cranial Nerves: No cranial nerve deficit.     Motor: No abnormal muscle tone.     Coordination: Coordination normal.     Deep Tendon Reflexes: Reflexes are normal and symmetric. Reflexes normal.  Psychiatric:        Behavior: Behavior normal.        Thought Content: Thought content normal.        Judgment: Judgment normal.   X-rays were done of the right shoulder, reported separately.        Assessment & Plan:   Encounter Diagnosis  Name Primary?   Chronic right shoulder pain Yes   PROCEDURE NOTE:  The patient request injection, verbal consent was obtained.  The right shoulder was prepped appropriately after time out was performed.   Sterile technique was observed and injection of 1 cc of DepoMedrol '40mg'$  with several cc's of plain xylocaine. Anesthesia was provided by ethyl chloride and a 20-gauge needle was used to inject the shoulder area. A posterior approach was used.  The injection was tolerated well.  A band aid dressing was applied.  The patient was advised to apply ice later today and tomorrow to the injection sight as needed.  Return in two weeks.  Call if any problem.  Precautions discussed.  Electronically Signed Sanjuana Kava, MD 11/1/20239:53 AM

## 2021-12-07 ENCOUNTER — Encounter: Payer: Self-pay | Admitting: *Deleted

## 2021-12-07 ENCOUNTER — Ambulatory Visit: Payer: Self-pay | Admitting: *Deleted

## 2021-12-07 NOTE — Patient Instructions (Signed)
Visit Information  Thank you for taking time to visit with me today. Please don't hesitate to contact me if I can be of assistance to you.   Following are the goals we discussed today:   Goals Addressed               This Visit's Progress     Find Help in My Community. (pt-stated)   On track     Care Coordination Interventions:  Assessed Social Determinant of Health Barriers. Discussed Plans with Patient for Ongoing Care Management Follow Up. Provided Patient with Direct Contact Information for Care Management Team. Screened Patient for Signs & Symptoms of Depression Related to Chronic Disease State.  WFU9/NAT5 Depression Screen Completed & Results Reviewed with Patient.  Suicidal Ideation/Homicidal Ideation Assessed - None Present. Solution-Focused Strategies Employed. Active Listening/Reflection Utilized.  Verbalization of Feelings Encouraged.  Problem Solving/Task-Centered Solutions Developed.   Reviewed Prescription Medications & Discussed Importance of Compliance. Quality of Sleep Assessed & Sleep Hygiene Techniques Promoted. Referral Placed to [facility] Guide to Provide Transportation Assistance and Resources. CSW Collaboration with Primary Care Provider, Chevis Pretty to Request Prescription Refill for Lasix 20 MG, PO, Daily.       Our next appointment is by telephone on 12/21/2021 at 12:45 pm.  Please call the care guide team at [phone] if you need to cancel or reschedule your appointment.   If you are experiencing a Mental Health or [place] or need someone to talk to, please call the Suicide and Crisis Lifeline: 988 call the [place] National Suicide Prevention Lifeline: 484-469-3891 or TTY: 458 586 0086 TTY (618) 315-0235) to talk to a trained counselor call 1-800-273-TALK (toll free, 24 hour hotline) go to [facility] [address] [place] [phone]) call the [place]:  [phone] call 911  Patient verbalizes understanding of instructions and care plan provided today and agrees to view in [place]. Active MyChart status and patient understanding of how to access instructions and care plan via MyChart confirmed with patient.     Telephone follow up appointment with care management team member scheduled for:  12/21/2021 at 12:45 pm.  Nat Christen, BSW, MSW, [place]  Licensed Clinical Social Worker  [place]  Mailing [place]. [address] [place], [place] [address] Physical Address[address], [place], [place] [address] Toll Free Main # [phone] Fax # [phone] Cell # [phone] [email or web address]

## 2021-12-07 NOTE — Patient Outreach (Signed)
  Care Coordination   Initial Visit Note   12/07/2021  Name: ELIN FENLEY MRN: [id number] DOB: 01/21/1930  ILIYAH BUI is a 88 y.o. year old female who sees Chevis Pretty, [place] for primary care. I spoke with [name] by phone today.  What matters to the patients health and wellness today?  Find Help in My Community.    Goals Addressed               This Visit's Progress     Find Help in My Community. (pt-stated)   On track     Care Coordination Interventions:  Assessed Social Determinant of Health Barriers. Discussed Plans with Patient for Ongoing Care Management Follow Up. Provided Patient with Direct Contact Information for Care Management Team. Screened Patient for Signs & Symptoms of Depression Related to Chronic Disease State.  HYI5/OYD7 Depression Screen Completed & Results Reviewed with Patient.  Suicidal Ideation/Homicidal Ideation Assessed - None Present. Solution-Focused Strategies Employed. Active Listening/Reflection Utilized.  Verbalization of Feelings Encouraged.  Problem Solving/Task-Centered Solutions Developed.   Reviewed Prescription Medications & Discussed Importance of Compliance. Quality of Sleep Assessed & Sleep Hygiene Techniques Promoted. Referral Placed to [facility] Guide to Provide Transportation Assistance and Resources. CSW Collaboration with Primary Care Provider, Chevis Pretty to Request Prescription Refill for Lasix 20 MG, PO, Daily.         SDOH assessments and interventions completed:  Yes.  SDOH Interventions Today    Flowsheet Row Most Recent Value  SDOH Interventions   Food Insecurity Interventions Intervention Not Indicated  Housing Interventions Intervention Not Indicated  Transportation Interventions Intervention Not Indicated  Utilities Interventions Intervention Not Indicated  Alcohol Usage Interventions Intervention Not Indicated (Score <7)  Financial Strain Interventions Intervention Not  Indicated  Physical Activity Interventions Patient Refused  Stress Interventions Intervention Not Indicated  Social Connections Interventions Intervention Not Indicated     Care Coordination Interventions Activated:  Yes.   Care Coordination Interventions:  Yes, provided.   Follow up plan: Follow up call scheduled for 12/21/2021 at 12:45 pm.  Encounter Outcome:  Pt. Visit Completed.   Nat Christen, BSW, MSW, LCSW  Licensed Education officer, environmental Health System  Mailing [place] [address] [place], [place] [address] Physical Address[address] [place], [place] [address] Toll Free Main # [phone] Fax # [phone] Cell # [phone] [email or web address]

## 2021-12-14 ENCOUNTER — Other Ambulatory Visit: Payer: Self-pay | Admitting: Nurse Practitioner

## 2021-12-14 ENCOUNTER — Encounter: Payer: Self-pay | Admitting: Orthopaedic Surgery

## 2021-12-14 ENCOUNTER — Ambulatory Visit (INDEPENDENT_AMBULATORY_CARE_PROVIDER_SITE_OTHER): Payer: PPO | Admitting: Orthopaedic Surgery

## 2021-12-14 DIAGNOSIS — R600 Localized edema: Secondary | ICD-10-CM

## 2021-12-14 DIAGNOSIS — G8929 Other chronic pain: Secondary | ICD-10-CM | POA: Diagnosis not present

## 2021-12-14 DIAGNOSIS — M25511 Pain in right shoulder: Secondary | ICD-10-CM

## 2021-12-14 MED ORDER — MELOXICAM 7.5 MG PO TABS: ORAL_TABLET | ORAL | 2 refills | Status: DC

## 2021-12-14 NOTE — Patient Instructions (Addendum)
Aspercreme, Biofreeze, Blue Emu or Voltaren Gel over the counter 2-3 times daily. Rub into area well each use for best results.  

## 2021-12-14 NOTE — Progress Notes (Signed)
My shoulder still hurts  The injection to the right shoulder only worked about three days.  She has chronic pain in the shoulder worse in the evening and at night. She has no trauma, no swelling, no numbness.  ROM of the right shoulder is decreased.  NV intact. ROM of neck is full.  Grips normal.  Encounter Diagnosis  Name Primary?   Chronic right shoulder pain Yes   I have recommended Voltaren gel.  I have recommended ice.  I will give Mobic 7.5 po pc  Return in three weeks.  Call if any problem.  Precautions discussed.  Electronically Signed Sanjuana Kava, MD 11/14/20231:40 PM

## 2021-12-21 ENCOUNTER — Encounter: Payer: Self-pay | Admitting: *Deleted

## 2021-12-21 ENCOUNTER — Ambulatory Visit: Payer: Self-pay | Admitting: *Deleted

## 2021-12-21 NOTE — Patient Instructions (Signed)
Visit Information  Thank you for taking time to visit with me today. Please don't hesitate to contact me if I can be of assistance to you.   Following are the goals we discussed today:   Goals Addressed               This Visit's Progress     COMPLETED: Find Help in My Community. (pt-stated)   On track     Care Coordination Interventions:  Active Listening Utilized.  Verbalization of Feelings Encouraged.  Emotional Support Provided. Task-Centered Solutions Employed.  Thorough Review and Explanation of Services Offered.  Please Accept All Calls from [place], in [place] to [employer], Services and Resources. Please Contact CSW Directly (# [phone]), if You Change Your Mind About Wanting to Receive Services, or If Additional Social Work Needs Are Identified in the Near Future.       Please call the care guide team at [phone] if you need to cancel or reschedule your appointment.   If you are experiencing a Mental Health or [place] or need someone to talk to, please call the Suicide and Crisis Lifeline: 988 call the [place] National Suicide Prevention Lifeline: 701-135-5006 or TTY: (332)823-0955 TTY (412)841-3604) to talk to a trained counselor call 1-800-273-TALK (toll free, 24 hour hotline) go to [facility] [address] [place] [phone]) call the [place]: [phone] call 911  Patient verbalizes understanding of instructions and care plan provided today and agrees to view in [place]. Active MyChart status and patient understanding of how to access instructions and care plan via MyChart confirmed with patient.     No further follow up required.  Nat Christen, BSW, MSW, LCSW  Licensed Education officer, environmental Health System  Mailing [place] [address], [place], [place] [address] Physical Address-[address] [place], [place] [address] Toll Free Main # [phone] Fax # [phone] Cell # [phone] [email or web address]

## 2021-12-21 NOTE — Patient Outreach (Signed)
  Care Coordination   Follow Up Visit Note   12/21/2021  Name: NOVALI VOLLMAN MRN: [id number] DOB: Jun 29, 1929  Wendy Robles is a 88 y.o. year old female who sees Chevis Pretty, [place] for primary care. I spoke with [name] by phone today.  What matters to the patients health and wellness today?  Find Help in My Community.    Goals Addressed               This Visit's Progress     COMPLETED: Find Help in My Community. (pt-stated)   On track     Care Coordination Interventions:  Active Listening Utilized.  Verbalization of Feelings Encouraged.  Emotional Support Provided. Task-Centered Solutions Employed.  Thorough Review and Explanation of Services Offered.  Please Accept All Calls from [place], in [place] to [employer], Services and Resources. Please Contact CSW Directly (# [phone]), if You Change Your Mind About Wanting to Receive Services, or If Additional Social Work Needs Are Identified in the Near Future.       SDOH assessments and interventions completed:  Yes.   Care Coordination Interventions Activated:  Yes.    Care Coordination Interventions:  Yes, Provided.    Follow up plan: No Further Intervention Required.   Encounter Outcome:  Pt. Visit Completed.    Nat Christen, BSW, MSW, LCSW  Licensed Education officer, environmental Health System  Mailing [place] [address] [place], [place] [address] Physical Address[address] [place], [place] [address] Toll Free Main # [phone] Fax # [phone] Cell # [phone] [email or web address]

## 2022-01-04 ENCOUNTER — Ambulatory Visit: Payer: PPO | Admitting: Orthopaedic Surgery

## 2022-01-17 ENCOUNTER — Ambulatory Visit (INDEPENDENT_AMBULATORY_CARE_PROVIDER_SITE_OTHER): Payer: PPO

## 2022-01-17 ENCOUNTER — Telehealth: Payer: Self-pay

## 2022-01-17 VITALS — Ht 64.0 in | Wt 190.0 lb

## 2022-01-17 DIAGNOSIS — Z0000 Encounter for general adult medical examination without abnormal findings: Secondary | ICD-10-CM | POA: Diagnosis not present

## 2022-01-17 DIAGNOSIS — R600 Localized edema: Secondary | ICD-10-CM

## 2022-01-17 MED ORDER — FUROSEMIDE 20 MG PO TABS: 20.0000 mg | ORAL_TABLET | Freq: Every day | ORAL | 1 refills | Status: DC

## 2022-01-17 NOTE — Patient Instructions (Signed)
[name] , Thank you for taking time to come for your Medicare Wellness Visit. I appreciate your ongoing commitment to your health goals. Please review the following plan we discussed and let me know if I can assist you in the future.   These are the goals we discussed:  Goals      Care Coordination Services     Care Coordination Interventions: Outreached by [facility] Triage nurse regarding transportation needs. Patient has an ortho appt on 12/01/21 in [place] and needs transportation assistance. Patient called the office this morning concerned that she may need to cancel the appt.  Chart reviewed including upcoming appointments. Appt with Dr Luna Glasgow on 12/01/21. Appt notes stated that patient has transportation arranged through [facility]. No chart notes indicating transportation was arranged and it seems patient is unaware.  Reviewed referrals and reached out to scheduling care guide, Noreene Larsson, regarding referral placed on 11/18/21 or CCM services for transportation assistance. Collaborated with [employer] and she will forward to community care guides for assistance with verifying transportation.  Patient would benefit from [facility]. Will ask scheduling care guide to reach out to patient and schedule a telephone visit with Jackelyn Poling, RN Care Coordinator or Nat Christen, LCSW      DIET - EAT MORE FRUITS AND VEGETABLES     Exercise 150 minutes per week (moderate activity)     Chair exercises daily (see handout)     Prevent falls        This is a list of the screening recommended for you and due dates:  Health Maintenance  Topic Date Due   DTaP/Tdap/Td vaccine (1 - Tdap) Never done   Zoster (Shingles) Vaccine (1 of 2) Never done   COVID-19 Vaccine (4 - 2023-24 season) 10/01/2021   Flu Shot  05/01/2022*   Medicare Annual Wellness Visit  01/18/2023   Pneumonia Vaccine  Completed   DEXA scan (bone density measurement)  Completed   HPV Vaccine  Aged Out   Mammogram   Discontinued  *Topic was postponed. The date shown is not the original due date.    Advanced directives: Please bring a copy of your health care power of attorney and living will to the office to be added to your chart at your convenience.   Conditions/risks identified: Aim for 30 minutes of exercise or brisk walking, 6-8 glasses of water, and 5 servings of fruits and vegetables each day.   Next appointment: Follow up in one year for your annual wellness visit    Preventive Care 65 Years and Older, Female Preventive care refers to lifestyle choices and visits with your health care provider that can promote health and wellness. What does preventive care include? A yearly physical exam. This is also called an annual well check. Dental exams once or twice a year. Routine eye exams. Ask your health care provider how often you should have your eyes checked. Personal lifestyle choices, including: Daily care of your teeth and gums. Regular physical activity. Eating a healthy diet. Avoiding tobacco and drug use. Limiting alcohol use. Practicing safe sex. Taking low-dose aspirin every day. Taking vitamin and mineral supplements as recommended by your health care provider. What happens during an annual well check? The services and screenings done by your health care provider during your annual well check will depend on your age, overall health, lifestyle risk factors, and family history of disease. Counseling  Your health care provider may ask you questions about your: Alcohol use. Tobacco use. Drug use.  Emotional well-being. Home and relationship well-being. Sexual activity. Eating habits. History of falls. Memory and ability to understand (cognition). Work and work Statistician. Reproductive health. Screening  You may have the following tests or measurements: Height, weight, and BMI. Blood pressure. Lipid and cholesterol levels. These may be checked every 5 years, or more  frequently if you are over 86 years old. Skin check. Lung cancer screening. You may have this screening every year starting at age 86 if you have a 30-pack-year history of smoking and currently smoke or have quit within the past 15 years. Fecal occult blood test (FOBT) of the stool. You may have this test every year starting at age 86. Flexible sigmoidoscopy or colonoscopy. You may have a sigmoidoscopy every 5 years or a colonoscopy every 10 years starting at age 86. Hepatitis C blood test. Hepatitis B blood test. Sexually transmitted disease (STD) testing. Diabetes screening. This is done by checking your blood sugar (glucose) after you have not eaten for a while (fasting). You may have this done every 1-3 years. Bone density scan. This is done to screen for osteoporosis. You may have this done starting at age 86. Mammogram. This may be done every 1-2 years. Talk to your health care provider about how often you should have regular mammograms. Talk with your health care provider about your test results, treatment options, and if necessary, the need for more tests. Vaccines  Your health care provider may recommend certain vaccines, such as: Influenza vaccine. This is recommended every year. Tetanus, diphtheria, and acellular pertussis (Tdap, Td) vaccine. You may need a Td booster every 10 years. Zoster vaccine. You may need this after age 86. Pneumococcal 13-valent conjugate (PCV13) vaccine. One dose is recommended after age 86. Pneumococcal polysaccharide (PPSV23) vaccine. One dose is recommended after age 86. Talk to your health care provider about which screenings and vaccines you need and how often you need them. This information is not intended to replace advice given to you by your health care provider. Make sure you discuss any questions you have with your health care provider. Document Released: 02/13/2015 Document Revised: 10/07/2015 Document Reviewed: 11/18/2014 Elsevier Interactive  Patient Education  2017 [place] Prevention in the Home Falls can cause injuries. They can happen to people of all ages. There are many things you can do to make your home safe and to help prevent falls. What can I do on the outside of my home? Regularly fix the edges of walkways and driveways and fix any cracks. Remove anything that might make you trip as you walk through a door, such as a raised step or threshold. Trim any bushes or trees on the path to your home. Use bright outdoor lighting. Clear any walking paths of anything that might make someone trip, such as rocks or tools. Regularly check to see if handrails are loose or broken. Make sure that both sides of any steps have handrails. Any raised decks and porches should have guardrails on the edges. Have any leaves, snow, or ice cleared regularly. Use sand or salt on walking paths during winter. Clean up any spills in your garage right away. This includes oil or grease spills. What can I do in the bathroom? Use night lights. Install grab bars by the toilet and in the tub and shower. Do not use towel bars as grab bars. Use non-skid mats or decals in the tub or shower. If you need to sit down in the shower, use a plastic, non-slip stool.  Keep the floor dry. Clean up any water that spills on the floor as soon as it happens. Remove soap buildup in the tub or shower regularly. Attach bath mats securely with double-sided non-slip rug tape. Do not have throw rugs and other things on the floor that can make you trip. What can I do in the bedroom? Use night lights. Make sure that you have a light by your bed that is easy to reach. Do not use any sheets or blankets that are too big for your bed. They should not hang down onto the floor. Have a firm chair that has side arms. You can use this for support while you get dressed. Do not have throw rugs and other things on the floor that can make you trip. What can I do in the  kitchen? Clean up any spills right away. Avoid walking on wet floors. Keep items that you use a lot in easy-to-reach places. If you need to reach something above you, use a strong step stool that has a grab bar. Keep electrical cords out of the way. Do not use floor polish or wax that makes floors slippery. If you must use wax, use non-skid floor wax. Do not have throw rugs and other things on the floor that can make you trip. What can I do with my stairs? Do not leave any items on the stairs. Make sure that there are handrails on both sides of the stairs and use them. Fix handrails that are broken or loose. Make sure that handrails are as long as the stairways. Check any carpeting to make sure that it is firmly attached to the stairs. Fix any carpet that is loose or worn. Avoid having throw rugs at the top or bottom of the stairs. If you do have throw rugs, attach them to the floor with carpet tape. Make sure that you have a light switch at the top of the stairs and the bottom of the stairs. If you do not have them, ask someone to add them for you. What else can I do to help prevent falls? Wear shoes that: Do not have high heels. Have rubber bottoms. Are comfortable and fit you well. Are closed at the toe. Do not wear sandals. If you use a stepladder: Make sure that it is fully opened. Do not climb a closed stepladder. Make sure that both sides of the stepladder are locked into place. Ask someone to hold it for you, if possible. Clearly mark and make sure that you can see: Any grab bars or handrails. First and last steps. Where the edge of each step is. Use tools that help you move around (mobility aids) if they are needed. These include: Canes. Walkers. Scooters. Crutches. Turn on the lights when you go into a dark area. Replace any light bulbs as soon as they burn out. Set up your furniture so you have a clear path. Avoid moving your furniture around. If any of your floors are  uneven, fix them. If there are any pets around you, be aware of where they are. Review your medicines with your doctor. Some medicines can make you feel dizzy. This can increase your chance of falling. Ask your doctor what other things that you can do to help prevent falls. This information is not intended to replace advice given to you by your health care provider. Make sure you discuss any questions you have with your health care provider. Document Released: 11/13/2008 Document Revised: 06/25/2015 Document Reviewed: 02/21/2014  Chartered certified accountant Patient Education  [employer].

## 2022-01-17 NOTE — Progress Notes (Signed)
Subjective:   Wendy Robles is a 88 y.o. female who presents for Medicare Annual (Subsequent) preventive examination.  I connected with  [name] on 01/17/22 by a audio enabled telemedicine application and verified that I am speaking with the correct person using two identifiers.  Patient Location: Home  Provider Location: Home Office  I discussed the limitations of evaluation and management by telemedicine. The patient expressed understanding and agreed to proceed.  Review of Systems     Cardiac Risk Factors include: advanced age (>30mn, >>92women);dyslipidemia;hypertension     Objective:    Today's Vitals   01/17/22 1333  Weight: 190 lb (86.2 kg)  Height: '5\' 4"'$  (1.626 m)   Body mass index is 32.61 kg/m.     01/17/2022    1:38 PM 12/07/2021   12:00 PM 10/13/2020   10:19 AM 04/29/2019   11:05 AM  Advanced Directives  Does Patient Have a Medical Advance Directive? Yes Yes Yes Yes  Type of Advance Directive [place] Living will  Does patient want to make changes to medical advance directive?  No - Patient declined    Copy of [place] Chart?  No - copy requested No - copy requested     Current Medications (verified) Outpatient Encounter Medications as of 01/17/2022  Medication Sig   aspirin 81 MG tablet Take 81 mg by mouth daily.   CALCIUM PO Take by mouth.   cetirizine (ZYRTEC) 10 MG tablet Take 1/2 (one-half) tablet by mouth once daily   Cholecalciferol (VITAMIN D) 2000 units CAPS Take 1 capsule 2 (two) times daily by mouth.    diclofenac Sodium (VOLTAREN) 1 % GEL Apply 4 g topically 4 (four) times daily.   furosemide (LASIX) 20 MG tablet TAKE 1/2 (ONE-HALF) TABLET BY MOUTH ONCE DAILY AS NEEDED FOR EDEMA   ibuprofen (ADVIL) 200 MG tablet Take 400 mg by mouth every 8 (eight) hours as needed (pain).   meloxicam (MOBIC) 7.5 MG tablet Take one tablet once  after eating.   Multiple Vitamin (MULTIVITAMIN) capsule Take 1 capsule by mouth daily.   NONFORMULARY OR COMPOUNDED ITEM Apply 1 application topically as needed (Relaxing leg cream: Gnaphalium & polycephalum causticum).   omega-3 acid ethyl esters (LOVAZA) 1 g capsule Take 2 capsules (2 g total) by mouth 2 (two) times daily.   polyethylene glycol (MIRALAX / GLYCOLAX) packet Take 17 g by mouth as needed.   rosuvastatin (CRESTOR) 20 MG tablet Take 1 tablet (20 mg total) by mouth daily.   [DISCONTINUED] acetaminophen (TYLENOL) 650 MG CR tablet Take 650 mg by mouth every 8 (eight) hours as needed for pain.   No facility-administered encounter medications on file as of 01/17/2022.    Allergies (verified) Neurontin [gabapentin] and Bactrim [sulfamethoxazole-trimethoprim]   History: Past Medical History:  Diagnosis Date   Arthritis    Colon polyps    Diverticulosis    Dyslipidemia    HTN (hypertension)    White coat   Hyperlipidemia    Osteoporosis    Past Surgical History:  Procedure Laterality Date   CARPAL TUNNEL RELEASE     HEMORRHOID SURGERY     HEMORROIDECTOMY     TOTAL ABDOMINAL HYSTERECTOMY     Family History  Problem Relation Age of Onset   Coronary artery disease Father 85  Neuropathy Father    Diabetes Father    Hypertension Father    Heart attack Father  Dementia Mother    Healthy Sister    COPD Brother    COPD Brother    COPD Brother    Diabetes Sister    Cancer Son        oral   Social History   Socioeconomic History   Marital status: Widowed    Spouse name: Not on file   Number of children: 2   Years of education: 7   Highest education level: 7th grade  Occupational History    Employer: RETIRED  Tobacco Use   Smoking status: Never    Passive exposure: Never   Smokeless tobacco: Never  Vaping Use   Vaping Use: Never used  Substance and Sexual Activity   Alcohol use: No   Drug use: No   Sexual activity: Not Currently    Partners: Male   Other Topics Concern   Not on file  Social History Narrative   Son passed away from cancer in 04/27/2014   Daughter lives in [place], [place]   Sister in [place], but has lots of health problems   She has a friend who helps her clean her home and shops for her once per week   Her cousin lives next door and checks mail and checks on her daily.   Social Determinants of Health   Financial Resource Strain: Low Risk  (01/17/2022)   Overall Financial Resource Strain (CARDIA)    Difficulty of Paying Living Expenses: Not hard at all  Food Insecurity: No Food Insecurity (01/17/2022)   Hunger Vital Sign    Worried About Running Out of Food in the Last Year: Never true    Ran Out of Food in the Last Year: Never true  Transportation Needs: No Transportation Needs (01/17/2022)   PRAPARE - Hydrologist (Medical): No    Lack of Transportation (Non-Medical): No  Physical Activity: Inactive (01/17/2022)   Exercise Vital Sign    Days of Exercise per Week: 0 days    Minutes of Exercise per Session: 0 min  Stress: No Stress Concern Present (01/17/2022)   [place]    Feeling of Stress : Not at all  Social Connections: Moderately Integrated (01/17/2022)   Social Connection and Isolation Panel [NHANES]    Frequency of Communication with Friends and Family: More than three times a week    Frequency of Social Gatherings with Friends and Family: Three times a week    Attends Religious Services: More than 4 times per year    Active Member of Clubs or Organizations: Yes    Attends Archivist Meetings: Never    Marital Status: Widowed    Tobacco Counseling Counseling given: Not Answered   Clinical Intake:  Pre-visit preparation completed: Yes  Pain : No/denies pain  Diabetes: No  How often do you need to have someone help you when you read instructions, pamphlets, or other written materials  from your doctor or pharmacy?: 1 - Never  Diabetic?No  Interpreter Needed?: No  Information entered by :: Denman George LPN   Activities of Daily Living    01/17/2022    1:38 PM 12/07/2021   12:00 PM  In your present state of health, do you have any difficulty performing the following activities:  Hearing? 0 0  Vision? 0 0  Difficulty concentrating or making decisions? 0 0  Walking or climbing stairs? 1 0  Dressing or bathing? 0 0  Doing errands, shopping? 1 0  Preparing Food and eating ? N N  Using the Toilet? N N  In the past six months, have you accidently leaked urine? N N  Do you have problems with loss of bowel control? N N  Managing your Medications? N N  Managing your Finances? N N  Housekeeping or managing your Housekeeping? Y N    Patient Care Team: Chevis Pretty, FNP as PCP - General (Nurse Practitioner) Melina Schools, OD (Optometry) Sanjuana Kava, MD as Consulting Physician (Orthopedic Surgery)  Indicate any recent Medical Services you may have received from other than [facility] providers in the past year (date may be approximate).     Assessment:   This is a routine wellness examination for Kaz[place].  Hearing/Vision screen Hearing Screening - Comments:: No concerns at this time  Vision Screening - Comments:: Due for routine eye exam; plans to schedule with [facility] Dr. Debe Coder in the upcoming year   Dietary issues and exercise activities discussed: Current Exercise Habits: The patient does not participate in regular exercise at present   Goals Addressed             This Visit's Progress    Prevent falls        Depression Screen    01/17/2022    1:40 PM 12/07/2021   11:57 AM 12/02/2020   10:06 AM 10/13/2020   10:15 AM 12/03/2019   12:21 PM 05/27/2019   10:08 AM 11/23/2018   10:05 AM  PHQ 2/9 Scores  PHQ - 2 Score 0 0 0 0 0 0 0  PHQ- 9 Score   0        Fall Risk    01/17/2022    1:37 PM 12/07/2021   11:59 AM 12/02/2020   10:06 AM  10/13/2020   10:07 AM 06/01/2020   10:21 AM  [place] in the past year? 0 0 0 1 1  Number falls in past yr: 0 0  0 0  Injury with Fall? 0 0  0 0  Risk for fall due to : Impaired balance/gait;Impaired mobility No Fall Risks  History of fall(s);Impaired balance/gait;Impaired vision;Orthopedic patient History of fall(s)  Follow up Falls evaluation completed;Education provided;Falls prevention discussed Falls prevention discussed;Education provided;Falls evaluation completed  Education provided;Falls prevention discussed Education provided    FALL RISK PREVENTION PERTAINING TO THE HOME:  Any stairs in or around the home? No  If so, are there any without handrails? No  Home free of loose throw rugs in walkways, pet beds, electrical cords, etc? Yes  Adequate lighting in your home to reduce risk of falls? Yes   ASSISTIVE DEVICES UTILIZED TO PREVENT FALLS:  Life alert? No  Use of a cane, walker or w/c? Yes  Grab bars in the bathroom? Yes  Shower chair or bench in shower? Yes  Elevated toilet seat or a handicapped toilet? Yes   TIMED UP AND GO:  Was the test performed? No . Telephonic visit    Cognitive Function:    09/13/2017   11:09 AM 08/16/2016   10:49 AM  MMSE - Mini Mental State Exam  Orientation to time 5 5  Orientation to Place 5 3  Registration 3 3  Attention/ Calculation 5 2  Recall 2 1  Language- name 2 objects 2 2  Language- repeat 1 0  Language- follow 3 step command 3 3  Language- read & follow direction 1 1  Write a sentence 1 1  Copy design 0 1  Total score 28  22        01/17/2022    1:39 PM 04/29/2019   11:08 AM  6CIT Screen  What Year? 0 points 0 points  What month? 0 points 0 points  What time? 0 points 0 points  Count back from 20 0 points 0 points  Months in reverse 0 points 0 points  Repeat phrase 0 points 0 points  Total Score 0 points 0 points    Immunizations Immunization History  Administered Date(s) Administered   Fluad  Quad(high Dose 65+) 12/03/2019, 12/02/2020   Influenza, High Dose Seasonal PF 11/02/2015, 11/08/2016, 12/05/2017   Influenza,inj,Quad PF,6+ Mos 11/04/2013, 12/08/2014   Influenza-Unspecified 10/31/2012, 12/11/2018   Moderna SARS-COV2 Booster Vaccination 01/02/2020   Moderna Sars-Covid-2 Vaccination 03/06/2019, 04/04/2019   Pneumococcal Conjugate-13 07/24/2013   Pneumococcal Polysaccharide-23 08/14/2014    TDAP status: Due, Education has been provided regarding the importance of this vaccine. Advised may receive this vaccine at local pharmacy or Health Dept. Aware to provide a copy of the vaccination record if obtained from local pharmacy or Health Dept. Verbalized acceptance and understanding.  Flu Vaccine status: Due, Education has been provided regarding the importance of this vaccine. Advised may receive this vaccine at local pharmacy or Health Dept. Aware to provide a copy of the vaccination record if obtained from local pharmacy or Health Dept. Verbalized acceptance and understanding.  Pneumococcal vaccine status: Up to date  Covid-19 vaccine status: Information provided on how to obtain vaccines.   Qualifies for Shingles Vaccine? Yes   Zostavax completed No   Shingrix Completed?: No.    Education has been provided regarding the importance of this vaccine. Patient has been advised to call insurance company to determine out of pocket expense if they have not yet received this vaccine. Advised may also receive vaccine at local pharmacy or Health Dept. Verbalized acceptance and understanding.  Screening Tests Health Maintenance  Topic Date Due   DTaP/Tdap/Td (1 - Tdap) Never done   Zoster Vaccines- Shingrix (1 of 2) Never done   COVID-19 Vaccine (4 - 2023-24 season) 10/01/2021   INFLUENZA VACCINE  05/01/2022 (Originally 08/31/2021)   Medicare Annual Wellness (AWV)  01/18/2023   Pneumonia Vaccine 62+ Years old  Completed   DEXA SCAN  Completed   HPV VACCINES  Aged Out   MAMMOGRAM   Discontinued    Health Maintenance  Health Maintenance Due  Topic Date Due   DTaP/Tdap/Td (1 - Tdap) Never done   Zoster Vaccines- Shingrix (1 of 2) Never done   COVID-19 Vaccine (4 - 2023-24 season) 10/01/2021    Colorectal cancer screening: No longer required.   Mammogram status: No longer required due to age .  Bone Density status: Completed 10/12/17. Results reflect: Bone density results: OSTEOPENIA. Repeat every unable to repeat due to mobility years.  Lung Cancer Screening: (Low Dose CT Chest recommended if Age 30-80 years, 30 pack-year currently smoking OR have quit w/in 15years.) does not qualify.   Lung Cancer Screening Referral: n/a  Additional Screening:  Hepatitis C Screening: does not qualify  Vision Screening: Recommended annual ophthalmology exams for early detection of glaucoma and other disorders of the eye. Is the patient up to date with their annual eye exam?  No  Who is the provider or what is the name of the office in which the patient attends annual eye exams? Plans to establish with [facility] Dr. Debe Coder in the upcoming year If pt is not established with a provider, would they like to be referred to  a provider to establish care? No .   Dental Screening: Recommended annual dental exams for proper oral hygiene  Community Resource Referral / Chronic Care Management: CRR required this visit?  No   CCM required this visit?  No      Plan:     I have personally reviewed and noted the following in the patient's chart:   Medical and social history Use of alcohol, tobacco or illicit drugs  Current medications and supplements including opioid prescriptions. Patient is not currently taking opioid prescriptions. Functional ability and status Nutritional status Physical activity Advanced directives List of other physicians Hospitalizations, surgeries, and ER visits in previous 12 months Vitals Screenings to include cognitive, depression, and falls Referrals  and appointments  In addition, I have reviewed and discussed with patient certain preventive protocols, quality metrics, and best practice recommendations. A written personalized care plan for preventive services as well as general preventive health recommendations were provided to patient.     Denman George [place], [place]   [id number]   Nurse Notes: Patient would like to know if she can take Furosemide '20mg'$  daily instead of the 1/2 dose due to bilateral lower extremity swelling.  Will route as a telephone call.

## 2022-01-17 NOTE — Telephone Encounter (Signed)
Patient seen for AWV today and would like to know if prescription for Furosemide can be changed to '20mg'$  a whole tablet as needed.  She states that she has been taking a whole tablet recently because of lower extremity swelling.  Please advise.

## 2022-03-31 ENCOUNTER — Encounter: Payer: Self-pay | Admitting: Radiology

## 2022-04-12 ENCOUNTER — Ambulatory Visit: Payer: Medicare HMO | Admitting: Nurse Practitioner

## 2022-04-12 ENCOUNTER — Ambulatory Visit (INDEPENDENT_AMBULATORY_CARE_PROVIDER_SITE_OTHER): Payer: Medicare HMO | Admitting: Nurse Practitioner

## 2022-04-12 ENCOUNTER — Encounter: Payer: Self-pay | Admitting: Nurse Practitioner

## 2022-04-12 VITALS — BP 133/62 | HR 81 | Temp 97.2°F | Resp 20

## 2022-04-12 DIAGNOSIS — E785 Hyperlipidemia, unspecified: Secondary | ICD-10-CM

## 2022-04-12 DIAGNOSIS — N3001 Acute cystitis with hematuria: Secondary | ICD-10-CM

## 2022-04-12 DIAGNOSIS — R69 Illness, unspecified: Secondary | ICD-10-CM | POA: Diagnosis not present

## 2022-04-12 DIAGNOSIS — R300 Dysuria: Secondary | ICD-10-CM | POA: Diagnosis not present

## 2022-04-12 DIAGNOSIS — I10 Essential (primary) hypertension: Secondary | ICD-10-CM | POA: Diagnosis not present

## 2022-04-12 DIAGNOSIS — M8588 Other specified disorders of bone density and structure, other site: Secondary | ICD-10-CM | POA: Diagnosis not present

## 2022-04-12 DIAGNOSIS — G609 Hereditary and idiopathic neuropathy, unspecified: Secondary | ICD-10-CM | POA: Diagnosis not present

## 2022-04-12 DIAGNOSIS — R600 Localized edema: Secondary | ICD-10-CM

## 2022-04-12 DIAGNOSIS — F5101 Primary insomnia: Secondary | ICD-10-CM

## 2022-04-12 DIAGNOSIS — R2681 Unsteadiness on feet: Secondary | ICD-10-CM

## 2022-04-12 DIAGNOSIS — Z6829 Body mass index (BMI) 29.0-29.9, adult: Secondary | ICD-10-CM | POA: Diagnosis not present

## 2022-04-12 LAB — URINALYSIS, COMPLETE
Bilirubin, UA: NEGATIVE
Glucose, UA: NEGATIVE
Ketones, UA: NEGATIVE
Nitrite, UA: NEGATIVE
Protein,UA: NEGATIVE
RBC, UA: NEGATIVE
Specific Gravity, UA: 1.015 (ref 1.005–1.030)
Urobilinogen, Ur: 0.2 mg/dL (ref 0.2–1.0)
pH, UA: 7 (ref 5.0–7.5)

## 2022-04-12 LAB — MICROSCOPIC EXAMINATION
RBC, Urine: NONE SEEN /hpf (ref 0–2)
Renal Epithel, UA: NONE SEEN /hpf

## 2022-04-12 MED ORDER — ROSUVASTATIN CALCIUM 20 MG PO TABS: 20.0000 mg | ORAL_TABLET | Freq: Every day | ORAL | 1 refills | Status: AC

## 2022-04-12 MED ORDER — OMEGA-3-ACID ETHYL ESTERS 1 G PO CAPS: 2.0000 | ORAL_CAPSULE | Freq: Two times a day (BID) | ORAL | 1 refills | Status: AC

## 2022-04-12 MED ORDER — DOXYCYCLINE HYCLATE 100 MG PO TABS: 100.0000 mg | ORAL_TABLET | Freq: Two times a day (BID) | ORAL | 0 refills | Status: DC

## 2022-04-12 MED ORDER — FUROSEMIDE 20 MG PO TABS: 20.0000 mg | ORAL_TABLET | Freq: Every day | ORAL | 1 refills | Status: DC

## 2022-04-12 NOTE — Progress Notes (Deleted)
Subjective:    Patient ID: Wendy Robles, female    DOB: 1929-03-13, 89 y.o.   MRN: [id number]   Chief Complaint: annual physical   HPI:  Wendy Robles is a 89 y.o. who identifies as a female who was assigned female at birth.   Social history: Lives with: by herself Work history: rtired   Comes in today for follow up of the following chronic medical issues:  1. Annual physical exam   2. Primary hypertension No c/o chest pain, sob or headache. Does not check blood pressure at home. BP Readings from Last 3 Encounters:  12/01/21 (!) 221/114  10/12/21 136/67  12/02/20 132/84     3. Hyperlipidemia with target LDL less than 100 Does not watch diet and does no dedicated exercise. Lab Results  Component Value Date   CHOL 128 10/12/2021   HDL 46 10/12/2021   LDLCALC 60 10/12/2021   TRIG 127 10/12/2021   CHOLHDL 2.8 10/12/2021     4. Hereditary and idiopathic peripheral neuropathy ***  5. Primary insomnia ***  6. Osteopenia of lumbar spine Last dexascan was done on 10/12/17. Needs repeating. Does no dedicated weight bearing exercises.  7. BMI 29.0-29.9,adult No recent weight changes   New complaints: ***  Allergies  Allergen Reactions   Neurontin [Gabapentin]    Bactrim [Sulfamethoxazole-Trimethoprim] Rash   Outpatient Encounter Medications as of 04/12/2022  Medication Sig   aspirin 81 MG tablet Take 81 mg by mouth daily.   CALCIUM PO Take by mouth.   cetirizine (ZYRTEC) 10 MG tablet Take 1/2 (one-half) tablet by mouth once daily   Cholecalciferol (VITAMIN D) 2000 units CAPS Take 1 capsule 2 (two) times daily by mouth.    diclofenac Sodium (VOLTAREN) 1 % GEL Apply 4 g topically 4 (four) times daily.   furosemide (LASIX) 20 MG tablet Take 1 tablet (20 mg total) by mouth daily.   ibuprofen (ADVIL) 200 MG tablet Take 400 mg by mouth every 8 (eight) hours as needed (pain).   meloxicam (MOBIC) 7.5 MG tablet Take one tablet once after eating.   Multiple  Vitamin (MULTIVITAMIN) capsule Take 1 capsule by mouth daily.   NONFORMULARY OR COMPOUNDED ITEM Apply 1 application topically as needed (Relaxing leg cream: Gnaphalium & polycephalum causticum).   omega-3 acid ethyl esters (LOVAZA) 1 g capsule Take 2 capsules (2 g total) by mouth 2 (two) times daily.   polyethylene glycol (MIRALAX / GLYCOLAX) packet Take 17 g by mouth as needed.   rosuvastatin (CRESTOR) 20 MG tablet Take 1 tablet (20 mg total) by mouth daily.   No facility-administered encounter medications on file as of 04/12/2022.    Past Surgical History:  Procedure Laterality Date   CARPAL TUNNEL RELEASE     HEMORRHOID SURGERY     HEMORROIDECTOMY     TOTAL ABDOMINAL HYSTERECTOMY      Family History  Problem Relation Age of Onset   Coronary artery disease Father 53   Neuropathy Father    Diabetes Father    Hypertension Father    Heart attack Father    Dementia Mother    Healthy Sister    COPD Brother    COPD Brother    COPD Brother    Diabetes Sister    Cancer Son        oral      Controlled substance contract: ***     Review of Systems     Objective:   Physical Exam  Assessment & Plan:

## 2022-04-12 NOTE — Progress Notes (Signed)
Subjective:    Patient ID: Wendy Robles, female    DOB: Oct 20, 1929, 89 y.o.   MRN: [id number]   Chief Complaint: Medical Management of Chronic Issues    HPI:  Wendy Robles is a 89 y.o. who identifies as a female who was assigned female at birth.   Social history: Lives with: by herself- family checks on her daily Work history: retired   Scientist, forensic in today for follow up of the following chronic medical issues:  1. Dysuria Has been going on for 69month ago. Urgency and frequency. No fever and no back pain.  2. Primary hypertension No c/o chest pain, sob or headache. Doe snot check blood pressure at home. BP Readings from Last 3 Encounters:  04/12/22 133/62  12/01/21 (!) 221/114  10/12/21 136/67     3. Hyperlipidemia with target LDL less than 100 Eats whatever family brings her to eat. Lab Results  Component Value Date   CHOL 128 10/12/2021   HDL 46 10/12/2021   LDLCALC 60 10/12/2021   TRIG 127 10/12/2021   CHOLHDL 2.8 10/12/2021     4. Hereditary and idiopathic peripheral neuropathy Numbness on bottom of both feet. They burn at night.  5. Primary insomnia Wakes up a lot to o to the restroom.  6. Osteopenia of lumbar spine Not able to do any weight bearing exercise.  7. BMI 29.0-29.9,adult No recent weight changes- patient was not able to weight today because she has trouble getting up and down.   New complaints: Is very weak. Has trouble getting up and down. Trouble walking.  Allergies  Allergen Reactions   Neurontin [Gabapentin]    Bactrim [Sulfamethoxazole-Trimethoprim] Rash   Outpatient Encounter Medications as of 04/12/2022  Medication Sig   aspirin 81 MG tablet Take 81 mg by mouth daily.   CALCIUM PO Take by mouth.   cetirizine (ZYRTEC) 10 MG tablet Take 1/2 (one-half) tablet by mouth once daily   Cholecalciferol (VITAMIN D) 2000 units CAPS Take 1 capsule 2 (two) times daily by mouth.    diclofenac Sodium (VOLTAREN) 1 % GEL Apply 4 g  topically 4 (four) times daily.   furosemide (LASIX) 20 MG tablet Take 1 tablet (20 mg total) by mouth daily.   ibuprofen (ADVIL) 200 MG tablet Take 400 mg by mouth every 8 (eight) hours as needed (pain).   meloxicam (MOBIC) 7.5 MG tablet Take one tablet once after eating.   Multiple Vitamin (MULTIVITAMIN) capsule Take 1 capsule by mouth daily.   NONFORMULARY OR COMPOUNDED ITEM Apply 1 application topically as needed (Relaxing leg cream: Gnaphalium & polycephalum causticum).   omega-3 acid ethyl esters (LOVAZA) 1 g capsule Take 2 capsules (2 g total) by mouth 2 (two) times daily.   polyethylene glycol (MIRALAX / GLYCOLAX) packet Take 17 g by mouth as needed.   rosuvastatin (CRESTOR) 20 MG tablet Take 1 tablet (20 mg total) by mouth daily.   No facility-administered encounter medications on file as of 04/12/2022.    Past Surgical History:  Procedure Laterality Date   CARPAL TUNNEL RELEASE     HEMORRHOID SURGERY     HEMORROIDECTOMY     TOTAL ABDOMINAL HYSTERECTOMY      Family History  Problem Relation Age of Onset   Coronary artery disease Father 864  Neuropathy Father    Diabetes Father    Hypertension Father    Heart attack Father    Dementia Mother    Healthy Sister    COPD Brother  COPD Brother    COPD Brother    Diabetes Sister    Cancer Son        oral      Controlled substance contract: n/a     Review of Systems  Constitutional:  Negative for diaphoresis.  Eyes:  Negative for pain.  Respiratory:  Negative for shortness of breath.   Cardiovascular:  Negative for chest pain, palpitations and leg swelling.  Gastrointestinal:  Negative for abdominal pain.  Endocrine: Negative for polydipsia.  Skin:  Negative for rash.  Neurological:  Negative for dizziness, weakness and headaches.  Hematological:  Does not bruise/bleed easily.  All other systems reviewed and are negative.      Objective:   Physical Exam Vitals and nursing note reviewed.  Constitutional:       General: She is not in acute distress.    Appearance: Normal appearance. She is well-developed.  Neck:     Vascular: No carotid bruit or JVD.  Cardiovascular:     Rate and Rhythm: Normal rate and regular rhythm.     Heart sounds: Murmur (2/6) heard.  Pulmonary:     Effort: Pulmonary effort is normal. No respiratory distress.     Breath sounds: Normal breath sounds. No wheezing or rales.  Chest:     Chest wall: No tenderness.  Abdominal:     General: Bowel sounds are normal. There is no distension or abdominal bruit.     Palpations: Abdomen is soft. There is no hepatomegaly, splenomegaly, mass or pulsatile mass.     Tenderness: There is no abdominal tenderness.  Musculoskeletal:        General: Normal range of motion.     Cervical back: Normal range of motion and neck supple.     Right lower leg: Right lower leg edema: 2+.     Left lower leg: Edema (2+) present.     Comments: Unable to get out of wheel chair  Lymphadenopathy:     Cervical: No cervical adenopathy.  Skin:    General: Skin is warm and dry.  Neurological:     Mental Status: She is alert and oriented to person, place, and time.     Deep Tendon Reflexes: Reflexes are normal and symmetric.  Psychiatric:        Behavior: Behavior normal.        Thought Content: Thought content normal.        Judgment: Judgment normal.    BP 133/62   Pulse 81   Temp (!) 97.2 F (36.2 C) (Temporal)   Resp 20   SpO2 96%      Wendy Robles comes in today with chief complaint of Medical Management of Chronic Issues   Diagnosis and orders addressed:  1. Primary hypertension Low sodium diet  2. Hyperlipidemia with target LDL less than 100 Low fat diet - omega-3 acid ethyl esters (LOVAZA) 1 g capsule; Take 2 capsules (2 g total) by mouth 2 (two) times daily.  Dispense: 360 capsule; Refill: 1 - rosuvastatin (CRESTOR) 20 MG tablet; Take 1 tablet (20 mg total) by mouth daily.  Dispense: 90 tablet; Refill: 1  3.  Hereditary and idiopathic peripheral neuropathy  4. Primary insomnia Bedtime routine  5. Osteopenia of lumbar spine Cannot do weight bearing exercises  6. BMI 29.0-29.9,adult Discussed diet and exercise for person with BMI >25 Will recheck weight in 3-6 months   7. Unsteady gait Ref home health PT - Ambulatory referral to [facility] - DME Wheelchair manual  8. Dysuria  - Urinalysis, Complete - Urine Culture  9. Acute cystitis with hematuria Take medication as prescribe Cotton underwear Take shower not bath Cranberry juice, yogurt Force fluids AZO over the counter X2 days Culture pending RTO prn Doxycycline 100 po BID  10. Edema, lower extremity Elevate legs when sitting - furosemide (LASIX) 20 MG tablet; Take 1 tablet (20 mg total) by mouth daily.  Dispense: 90 tablet; Refill: 1   Labs pending Health Maintenance reviewed Diet and exercise encouraged  Follow up plan: 6 months   Mary-Margaret Hassell Done, FNP

## 2022-04-12 NOTE — Patient Instructions (Signed)
Fall Prevention in the Home, Adult Falls can cause injuries and can happen to people of all ages. There are many things you can do to make your home safer and to help prevent falls. What actions can I take to prevent falls? General information Use good lighting in all rooms. Make sure to: Replace any light bulbs that burn out. Turn on the lights in dark areas and use night-lights. Keep items that you use often in easy-to-reach places. Lower the shelves around your home if needed. Move furniture so that there are clear paths around it. Do not use throw rugs or other things on the floor that can make you trip. If any of your floors are uneven, fix them. Add color or contrast paint or tape to clearly mark and help you see: Grab bars or handrails. First and last steps of staircases. Where the edge of each step is. If you use a ladder or stepladder: Make sure that it is fully opened. Do not climb a closed ladder. Make sure the sides of the ladder are locked in place. Have someone hold the ladder while you use it. Know where your pets are as you move through your home. What can I do in the bathroom?     Keep the floor dry. Clean up any water on the floor right away. Remove soap buildup in the bathtub or shower. Buildup makes bathtubs and showers slippery. Use non-skid mats or decals on the floor of the bathtub or shower. Attach bath mats securely with double-sided, non-slip rug tape. If you need to sit down in the shower, use a non-slip stool. Install grab bars by the toilet and in the bathtub and shower. Do not use towel bars as grab bars. What can I do in the bedroom? Make sure that you have a light by your bed that is easy to reach. Do not use any sheets or blankets on your bed that hang to the floor. Have a firm chair or bench with side arms that you can use for support when you get dressed. What can I do in the kitchen? Clean up any spills right away. If you need to reach something  above you, use a step stool with a grab bar. Keep electrical cords out of the way. Do not use floor polish or wax that makes floors slippery. What can I do with my stairs? Do not leave anything on the stairs. Make sure that you have a light switch at the top and the bottom of the stairs. Make sure that there are handrails on both sides of the stairs. Fix handrails that are broken or loose. Install non-slip stair treads on all your stairs if they do not have carpet. Avoid having throw rugs at the top or bottom of the stairs. Choose a carpet that does not hide the edge of the steps on the stairs. Make sure that the carpet is firmly attached to the stairs. Fix carpet that is loose or worn. What can I do on the outside of my home? Use bright outdoor lighting. Fix the edges of walkways and driveways and fix any cracks. Clear paths of anything that can make you trip, such as tools or rocks. Add color or contrast paint or tape to clearly mark and help you see anything that might make you trip as you walk through a door, such as a raised step or threshold. Trim any bushes or trees on paths to your home. Check to see if handrails are loose   or broken and that both sides of all steps have handrails. Install guardrails along the edges of any raised decks and porches. Have leaves, snow, or ice cleared regularly. Use sand, salt, or ice melter on paths if you live where there is ice and snow during the winter. Clean up any spills in your garage right away. This includes grease or oil spills. What other actions can I take? Review your medicines with your doctor. Some medicines can cause dizziness or changes in blood pressure, which increase your risk of falling. Wear shoes that: Have a low heel. Do not wear high heels. Have rubber bottoms and are closed at the toe. Feel good on your feet and fit well. Use tools that help you move around if needed. These include: Canes. Walkers. Scooters. Crutches. Ask  your doctor what else you can do to help prevent falls. This may include seeing a physical therapist to learn to do exercises to move better and get stronger. Where to find more information Centers for Disease Control and Prevention, [employer]: cdc.gov National Institute on Aging: nia.nih.gov National Institute on Aging: nia.nih.gov Contact a doctor if: You are afraid of falling at home. You feel weak, drowsy, or dizzy at home. You fall at home. Get help right away if you: Lose consciousness or have trouble moving after a fall. Have a fall that causes a head injury. These symptoms may be an emergency. Get help right away. Call 911. Do not wait to see if the symptoms will go away. Do not drive yourself to the hospital. This information is not intended to replace advice given to you by your health care provider. Make sure you discuss any questions you have with your health care provider. Document Revised: 09/20/2021 Document Reviewed: 09/20/2021 Elsevier Patient Education  2023 Elsevier Inc.  

## 2022-04-13 LAB — CMP14+EGFR
ALT: 19 IU/L (ref 0–32)
AST: 23 IU/L (ref 0–40)
Albumin/Globulin Ratio: 1.9 (ref 1.2–2.2)
Albumin: 4.6 g/dL (ref 3.6–4.6)
Alkaline Phosphatase: 56 IU/L (ref 44–121)
BUN/Creatinine Ratio: 33 — ABNORMAL HIGH (ref 12–28)
BUN: 20 mg/dL (ref 10–36)
Bilirubin Total: 0.4 mg/dL (ref 0.0–1.2)
CO2: 25 mmol/L (ref 20–29)
Calcium: 10.1 mg/dL (ref 8.7–10.3)
Chloride: 102 mmol/L (ref 96–106)
Creatinine, Ser: 0.6 mg/dL (ref 0.57–1.00)
Globulin, Total: 2.4 g/dL (ref 1.5–4.5)
Glucose: 96 mg/dL (ref 70–99)
Potassium: 4.3 mmol/L (ref 3.5–5.2)
Sodium: 142 mmol/L (ref 134–144)
Total Protein: 7 g/dL (ref 6.0–8.5)
eGFR: 85 mL/min/{1.73_m2} (ref >59)

## 2022-04-13 LAB — LIPID PANEL
Chol/HDL Ratio: 2.9 ratio (ref 0.0–4.4)
Cholesterol, Total: 134 mg/dL (ref 100–199)
HDL: 46 mg/dL (ref >39)
LDL Chol Calc (NIH): 70 mg/dL (ref 0–99)
Triglycerides: 99 mg/dL (ref 0–149)
VLDL Cholesterol Cal: 18 mg/dL (ref 5–40)

## 2022-04-13 LAB — CBC WITH DIFFERENTIAL/PLATELET
Basophils Absolute: 0.1 10*3/uL (ref 0.0–0.2)
Basos: 1 %
EOS (ABSOLUTE): 0.1 10*3/uL (ref 0.0–0.4)
Eos: 1 %
Hematocrit: 43.1 % (ref 34.0–46.6)
Hemoglobin: 13.6 g/dL (ref 11.1–15.9)
Immature Grans (Abs): 0 10*3/uL (ref 0.0–0.1)
Immature Granulocytes: 0 %
Lymphocytes Absolute: 1.8 10*3/uL (ref 0.7–3.1)
Lymphs: 31 %
MCH: 28.8 pg (ref 26.6–33.0)
MCHC: 31.6 g/dL (ref 31.5–35.7)
MCV: 91 fL (ref 79–97)
Monocytes Absolute: 0.5 10*3/uL (ref 0.1–0.9)
Monocytes: 8 %
Neutrophils Absolute: 3.5 10*3/uL (ref 1.4–7.0)
Neutrophils: 59 %
Platelets: 221 10*3/uL (ref 150–450)
RBC: 4.73 x10E6/uL (ref 3.77–5.28)
RDW: 12.2 % (ref 11.7–15.4)
WBC: 5.9 10*3/uL (ref 3.4–10.8)

## 2022-04-15 LAB — URINE CULTURE

## 2022-05-12 ENCOUNTER — Telehealth: Payer: Self-pay | Admitting: *Deleted

## 2022-05-12 NOTE — Telephone Encounter (Signed)
TC from [place] PT w/ [facility] Are there any wgt bearing restrictions or as tolerated w/ all activities. Verbal per PCP PT weight bearing as tolerated w/ all activities, she is weak

## 2022-05-14 ENCOUNTER — Telehealth: Payer: Self-pay | Admitting: Family Medicine

## 2022-05-14 NOTE — Telephone Encounter (Signed)
**  [facility] Family Medicine After Hours/ Emergency Line Call**  Patient: Wendy Robles.  PCP: Bennie Pierini, FNP  Patient's PT calls to inform that she faxed over information re: BP being elevated to 151/88 on Thursday.  Apparently, they are always elevated if someone other than herself/ family checks them.  PT calls back today to make sure no further interventions needed for this 89 year old patient.  I reviewed the patient's records.  Her BP just last month was WNL with PCP.  Given advanced age, I hesitate to adjust BP meds on this patient given risk of precipitating an orthostatic hypotension.  She is asymptomatic, so recommended ongoing monitoring of BPs over the weekend. Follow up with PCP if BPs persistently elevated or she becomes symptomatic.  Treasure Ingrum M. Nadine Counts, DO

## 2022-05-16 ENCOUNTER — Telehealth: Payer: Self-pay

## 2022-05-16 NOTE — Telephone Encounter (Signed)
Wendy Robles (Key: [id number]) PA Case ID #: [id number] Rx #: [id number] Need Help? Call us at [phone] Status sent iconSent to Plan today Drug Omega-3-acid Ethyl Esters 1GM capsules ePA cloud logo Form [employer] Electronic PA Form [phone] NCPDP)

## 2022-05-17 NOTE — Telephone Encounter (Signed)
NOTICE OF DENIAL OF MEDICARE PART D PRESCRIPTION DRUG COVERAGE Date: 05/16/2022 Enrollee's Name: Wendy Robles Member Number: [id number] Your request was denied We have denied coverage or payment under your Medicare Part D benefit for the following prescription drug(s)  that you or your prescriber requested: OMEGA-3-ACID ETHYL ESTERS Capsule Why did we deny your request? We denied this request under Medicare Part D because: The information provided by your prescriber did not  meet the requirements for covering this medication (prior authorization).  Your plan does not allow coverage of this medication based on your prescriber answering No to the following  question(s): Prior to the start of treatment with a triglyceride lowering drug, has/had the patient's pretreatment triglyceride  level been greater than or equal to 500 mg/dL? You should share a copy of this decision with your prescriber so you and your prescriber can discuss next steps.  If your prescriber requested coverage on your behalf, we have shared this decision with your prescribe

## 2022-05-17 NOTE — Telephone Encounter (Signed)
Tell patient she can take OTC omega vitamin- insurance denied coverage for prescription med

## 2022-05-18 NOTE — Telephone Encounter (Signed)
Patient aware and verbalizes understanding. 

## 2022-09-13 ENCOUNTER — Ambulatory Visit (INDEPENDENT_AMBULATORY_CARE_PROVIDER_SITE_OTHER): Payer: Medicare HMO

## 2022-09-13 DIAGNOSIS — F5101 Primary insomnia: Secondary | ICD-10-CM

## 2022-09-13 DIAGNOSIS — R300 Dysuria: Secondary | ICD-10-CM

## 2022-09-13 DIAGNOSIS — Z791 Long term (current) use of non-steroidal anti-inflammatories (NSAID): Secondary | ICD-10-CM | POA: Diagnosis not present

## 2022-09-13 DIAGNOSIS — R600 Localized edema: Secondary | ICD-10-CM | POA: Diagnosis not present

## 2022-09-13 DIAGNOSIS — E785 Hyperlipidemia, unspecified: Secondary | ICD-10-CM

## 2022-09-13 DIAGNOSIS — Z87440 Personal history of urinary (tract) infections: Secondary | ICD-10-CM

## 2022-09-13 DIAGNOSIS — G609 Hereditary and idiopathic neuropathy, unspecified: Secondary | ICD-10-CM

## 2022-09-13 DIAGNOSIS — Z7982 Long term (current) use of aspirin: Secondary | ICD-10-CM | POA: Diagnosis not present

## 2022-09-13 DIAGNOSIS — I10 Essential (primary) hypertension: Secondary | ICD-10-CM | POA: Diagnosis not present

## 2022-09-13 DIAGNOSIS — M8588 Other specified disorders of bone density and structure, other site: Secondary | ICD-10-CM

## 2022-10-11 ENCOUNTER — Ambulatory Visit: Payer: Medicare HMO | Admitting: Nurse Practitioner

## 2022-10-14 ENCOUNTER — Encounter: Payer: Self-pay | Admitting: Nurse Practitioner

## 2022-10-28 ENCOUNTER — Ambulatory Visit: Payer: Medicare HMO | Admitting: Nurse Practitioner

## 2022-11-09 ENCOUNTER — Emergency Department (HOSPITAL_COMMUNITY): Payer: Medicare HMO

## 2022-11-09 ENCOUNTER — Other Ambulatory Visit: Payer: Self-pay

## 2022-11-09 ENCOUNTER — Encounter (HOSPITAL_COMMUNITY): Payer: Self-pay | Admitting: Emergency Medicine

## 2022-11-09 ENCOUNTER — Observation Stay (HOSPITAL_COMMUNITY)
Admission: EM | Admit: 2022-11-09 | Discharge: 2022-11-11 | Disposition: A | Discharge status: Skilled Nursing Facility | Payer: Medicare HMO | Attending: Family Medicine | Admitting: Family Medicine

## 2022-11-09 DIAGNOSIS — E669 Obesity, unspecified: Secondary | ICD-10-CM | POA: Insufficient documentation

## 2022-11-09 DIAGNOSIS — I10 Essential (primary) hypertension: Secondary | ICD-10-CM | POA: Insufficient documentation

## 2022-11-09 DIAGNOSIS — R9389 Abnormal findings on diagnostic imaging of other specified body structures: Secondary | ICD-10-CM | POA: Diagnosis not present

## 2022-11-09 DIAGNOSIS — I6782 Cerebral ischemia: Secondary | ICD-10-CM | POA: Diagnosis not present

## 2022-11-09 DIAGNOSIS — R2681 Unsteadiness on feet: Secondary | ICD-10-CM | POA: Insufficient documentation

## 2022-11-09 DIAGNOSIS — N3000 Acute cystitis without hematuria: Secondary | ICD-10-CM | POA: Diagnosis not present

## 2022-11-09 DIAGNOSIS — R531 Weakness: Secondary | ICD-10-CM | POA: Diagnosis not present

## 2022-11-09 DIAGNOSIS — R2689 Other abnormalities of gait and mobility: Secondary | ICD-10-CM | POA: Diagnosis not present

## 2022-11-09 DIAGNOSIS — Z7982 Long term (current) use of aspirin: Secondary | ICD-10-CM | POA: Insufficient documentation

## 2022-11-09 DIAGNOSIS — S0990XA Unspecified injury of head, initial encounter: Secondary | ICD-10-CM | POA: Diagnosis not present

## 2022-11-09 DIAGNOSIS — Z79899 Other long term (current) drug therapy: Secondary | ICD-10-CM | POA: Insufficient documentation

## 2022-11-09 DIAGNOSIS — M160 Bilateral primary osteoarthritis of hip: Secondary | ICD-10-CM | POA: Diagnosis not present

## 2022-11-09 DIAGNOSIS — Z6832 Body mass index (BMI) 32.0-32.9, adult: Secondary | ICD-10-CM | POA: Insufficient documentation

## 2022-11-09 DIAGNOSIS — M8580 Other specified disorders of bone density and structure, unspecified site: Secondary | ICD-10-CM | POA: Diagnosis not present

## 2022-11-09 DIAGNOSIS — M79605 Pain in left leg: Secondary | ICD-10-CM | POA: Diagnosis not present

## 2022-11-09 DIAGNOSIS — N390 Urinary tract infection, site not specified: Principal | ICD-10-CM | POA: Insufficient documentation

## 2022-11-09 DIAGNOSIS — E876 Hypokalemia: Secondary | ICD-10-CM | POA: Diagnosis not present

## 2022-11-09 DIAGNOSIS — R030 Elevated blood-pressure reading, without diagnosis of hypertension: Secondary | ICD-10-CM | POA: Insufficient documentation

## 2022-11-09 DIAGNOSIS — W19XXXA Unspecified fall, initial encounter: Secondary | ICD-10-CM | POA: Diagnosis not present

## 2022-11-09 DIAGNOSIS — E785 Hyperlipidemia, unspecified: Secondary | ICD-10-CM | POA: Diagnosis present

## 2022-11-09 DIAGNOSIS — M11262 Other chondrocalcinosis, left knee: Secondary | ICD-10-CM | POA: Diagnosis not present

## 2022-11-09 DIAGNOSIS — M6281 Muscle weakness (generalized): Secondary | ICD-10-CM | POA: Diagnosis not present

## 2022-11-09 DIAGNOSIS — Z743 Need for continuous supervision: Secondary | ICD-10-CM | POA: Diagnosis not present

## 2022-11-09 DIAGNOSIS — S80812A Abrasion, left lower leg, initial encounter: Secondary | ICD-10-CM | POA: Diagnosis not present

## 2022-11-09 DIAGNOSIS — D500 Iron deficiency anemia secondary to blood loss (chronic): Secondary | ICD-10-CM | POA: Insufficient documentation

## 2022-11-09 DIAGNOSIS — I672 Cerebral atherosclerosis: Secondary | ICD-10-CM | POA: Diagnosis not present

## 2022-11-09 DIAGNOSIS — R21 Rash and other nonspecific skin eruption: Secondary | ICD-10-CM | POA: Diagnosis not present

## 2022-11-09 LAB — CBC
HCT: 40.6 % (ref 36.0–46.0)
Hemoglobin: 13.2 g/dL (ref 12.0–15.0)
MCH: 30.7 pg (ref 26.0–34.0)
MCHC: 32.5 g/dL (ref 30.0–36.0)
MCV: 94.4 fL (ref 80.0–100.0)
Platelets: 155 10*3/uL (ref 150–400)
RBC: 4.3 MIL/uL (ref 3.87–5.11)
RDW: 13.2 % (ref 11.5–15.5)
WBC: 12.8 10*3/uL — ABNORMAL HIGH (ref 4.0–10.5)
nRBC: 0 % (ref 0.0–0.2)

## 2022-11-09 LAB — BASIC METABOLIC PANEL
Anion gap: 11 (ref 5–15)
BUN: 25 mg/dL — ABNORMAL HIGH (ref 8–23)
CO2: 26 mmol/L (ref 22–32)
Calcium: 9.4 mg/dL (ref 8.9–10.3)
Chloride: 101 mmol/L (ref 98–111)
Creatinine, Ser: 0.62 mg/dL (ref 0.44–1.00)
GFR, Estimated: >60 mL/min (ref >60)
Glucose, Bld: 116 mg/dL — ABNORMAL HIGH (ref 70–99)
Potassium: 3.8 mmol/L (ref 3.5–5.1)
Sodium: 138 mmol/L (ref 135–145)

## 2022-11-09 LAB — HEPATIC FUNCTION PANEL
ALT: 22 U/L (ref 0–44)
AST: 24 U/L (ref 15–41)
Albumin: 3.5 g/dL (ref 3.5–5.0)
Alkaline Phosphatase: 44 U/L (ref 38–126)
Bilirubin, Direct: 0.3 mg/dL — ABNORMAL HIGH (ref 0.0–0.2)
Indirect Bilirubin: 0.9 mg/dL (ref 0.3–0.9)
Total Bilirubin: 1.2 mg/dL (ref 0.3–1.2)
Total Protein: 6.6 g/dL (ref 6.5–8.1)

## 2022-11-09 LAB — URINALYSIS, ROUTINE W REFLEX MICROSCOPIC
Bilirubin Urine: NEGATIVE
Glucose, UA: NEGATIVE mg/dL
Ketones, ur: 5 mg/dL — AB
Nitrite: NEGATIVE
Protein, ur: 100 mg/dL — AB
Specific Gravity, Urine: 1.017 (ref 1.005–1.030)
WBC, UA: >50 WBC/hpf (ref 0–5)
pH: 7 (ref 5.0–8.0)

## 2022-11-09 LAB — TROPONIN I (HIGH SENSITIVITY)
Troponin I (High Sensitivity): 22 ng/L — ABNORMAL HIGH (ref <18)
Troponin I (High Sensitivity): 21 ng/L — ABNORMAL HIGH (ref <18)

## 2022-11-09 LAB — LIPASE, BLOOD: Lipase: 21 U/L (ref 11–51)

## 2022-11-09 MED ORDER — ROSUVASTATIN CALCIUM 20 MG PO TABS
20.0000 mg | ORAL_TABLET | Freq: Every day | ORAL | Status: DC
  Filled 2022-11-09: qty 1

## 2022-11-09 MED ORDER — ASPIRIN 81 MG PO CHEW
81.0000 mg | CHEWABLE_TABLET | Freq: Every day | ORAL | Status: DC
  Administered 2022-11-09 – 2022-11-10 (×2): 81 mg via ORAL
  Filled 2022-11-09 (×2): qty 1

## 2022-11-09 MED ORDER — HEPARIN SODIUM (PORCINE) 5000 UNIT/ML IJ SOLN
5000.0000 [IU] | Freq: Three times a day (TID) | INTRAMUSCULAR | Status: DC
  Administered 2022-11-10 – 2022-11-11 (×5): 5000 [IU] via SUBCUTANEOUS
  Filled 2022-11-09 (×5): qty 1

## 2022-11-09 MED ORDER — FOSFOMYCIN TROMETHAMINE 3 G PO PACK
3.0000 g | PACK | Freq: Once | ORAL | Status: AC
  Administered 2022-11-09: 3 g via ORAL
  Filled 2022-11-09: qty 3

## 2022-11-09 MED ORDER — POLYETHYLENE GLYCOL 3350 17 G PO PACK
17.0000 g | PACK | Freq: Once | ORAL | Status: AC
  Administered 2022-11-09: 17 g via ORAL
  Filled 2022-11-09: qty 1

## 2022-11-09 MED ORDER — ROSUVASTATIN CALCIUM 20 MG PO TABS
20.0000 mg | ORAL_TABLET | Freq: Every day | ORAL | Status: DC
  Administered 2022-11-10: 20 mg via ORAL
  Filled 2022-11-09: qty 1

## 2022-11-09 MED ORDER — ACETAMINOPHEN 325 MG PO TABS
650.0000 mg | ORAL_TABLET | Freq: Once | ORAL | Status: AC
  Administered 2022-11-09: 650 mg via ORAL
  Filled 2022-11-09: qty 2

## 2022-11-09 MED ORDER — SODIUM CHLORIDE 0.9 % IV SOLN
1.0000 g | INTRAVENOUS | Status: DC
  Administered 2022-11-09 – 2022-11-10 (×2): 1 g via INTRAVENOUS
  Filled 2022-11-09: qty 10

## 2022-11-09 MED ORDER — POLYETHYLENE GLYCOL 3350 17 G PO PACK
17.0000 g | PACK | ORAL | Status: DC | PRN
  Administered 2022-11-10: 17 g via ORAL
  Filled 2022-11-09: qty 1

## 2022-11-09 MED ORDER — ALBUTEROL SULFATE (2.5 MG/3ML) 0.083% IN NEBU: 2.5000 mg | INHALATION_SOLUTION | RESPIRATORY_TRACT | Status: DC | PRN

## 2022-11-09 MED ORDER — OMEGA-3-ACID ETHYL ESTERS 1 G PO CAPS
2.0000 | ORAL_CAPSULE | Freq: Two times a day (BID) | ORAL | Status: DC
  Administered 2022-11-10 – 2022-11-11 (×4): 2 g via ORAL
  Filled 2022-11-09 (×5): qty 2

## 2022-11-09 MED ORDER — ACETAMINOPHEN 325 MG PO TABS
650.0000 mg | ORAL_TABLET | Freq: Four times a day (QID) | ORAL | Status: DC | PRN
  Administered 2022-11-10: 650 mg via ORAL
  Filled 2022-11-09: qty 2

## 2022-11-09 MED ORDER — HYDRALAZINE HCL 20 MG/ML IJ SOLN: 5.0000 mg | INTRAMUSCULAR | Status: DC | PRN

## 2022-11-09 MED ORDER — FUROSEMIDE 40 MG PO TABS
20.0000 mg | ORAL_TABLET | Freq: Every day | ORAL | Status: DC
  Filled 2022-11-09: qty 1

## 2022-11-09 NOTE — ED Notes (Signed)
Pt states rash on left flank/side hurts only with movement. Pt able to roll side to side. No other bruising/deformities noted.

## 2022-11-09 NOTE — NC FL2 (Deleted)
[place] MEDICAID FL2 LEVEL OF CARE FORM     IDENTIFICATION  Patient Name: JAMIELEE MCHALE Birthdate: 07-05-1929 Sex: female Admission Date (Current Location): 11/09/2022  [facility] and [place] Number:  [employer] and Address:  [facility],  [address] [place] [address]      Provider Number: [id number]  Attending Physician Name and Address:  Coral Spikes, DO  Relative Name and Phone Number:       Current Level of Care: Hospital Recommended Level of Care: Skilled Nursing Facility Prior Approval Number:    Date Approved/Denied:   PASRR Number:    Discharge Plan: SNF    Current Diagnoses: Patient Active Problem List   Diagnosis Date Noted   Hypertension 05/27/2019   BMI 29.0-29.9,adult 12/04/2014   Hereditary and idiopathic peripheral neuropathy 02/03/2014   Osteopenia 01/15/2014   Insomnia 10/17/2012   Hyperlipidemia with target LDL less than 100 10/17/2012   Abnormal EKG 07/28/2010    Orientation RESPIRATION BLADDER Height & Weight     Self, Time, Situation, Place  Normal Continent Weight: 190 lb (86.2 kg) Height:  5\' 4"  (162.6 cm)  BEHAVIORAL SYMPTOMS/MOOD NEUROLOGICAL BOWEL NUTRITION STATUS      Continent Diet (Regular)  AMBULATORY STATUS COMMUNICATION OF NEEDS Skin   Extensive Assist Verbally Normal                       Personal Care Assistance Level of Assistance  Bathing, Feeding, Dressing Bathing Assistance: Limited assistance Feeding assistance: Independent Dressing Assistance: Limited assistance     Functional Limitations Info  Sight, Hearing, Speech Sight Info: Adequate Hearing Info: Adequate Speech Info: Adequate    SPECIAL CARE FACTORS FREQUENCY  PT (By licensed PT), OT (By licensed OT)     PT Frequency: 5x week OT Frequency: 5x week            Contractures Contractures Info: Not present    Additional Factors Info  Code Status, Allergies Code Status Info: Full Allergies Info: Neurontin,  Bactrim           Current Medications (11/09/2022):  This is the current hospital active medication list No current facility-administered medications for this encounter.   Current Outpatient Medications  Medication Sig Dispense Refill   aspirin 81 MG tablet Take 81 mg by mouth daily.     CALCIUM PO Take by mouth.     cetirizine (ZYRTEC) 10 MG tablet Take 1/2 (one-half) tablet by mouth once daily 45 tablet 1   Cholecalciferol (VITAMIN D) 2000 units CAPS Take 1 capsule 2 (two) times daily by mouth.      diclofenac Sodium (VOLTAREN) 1 % GEL Apply 4 g topically 4 (four) times daily. 350 g 1   doxycycline (VIBRA-TABS) 100 MG tablet Take 1 tablet (100 mg total) by mouth 2 (two) times daily. 1 po bid 20 tablet 0   furosemide (LASIX) 20 MG tablet Take 1 tablet (20 mg total) by mouth daily. 90 tablet 1   ibuprofen (ADVIL) 200 MG tablet Take 400 mg by mouth every 8 (eight) hours as needed (pain).     meloxicam (MOBIC) 7.5 MG tablet Take one tablet once after eating. 30 tablet 2   Multiple Vitamin (MULTIVITAMIN) capsule Take 1 capsule by mouth daily.     NONFORMULARY OR COMPOUNDED ITEM Apply 1 application topically as needed (Relaxing leg cream: Gnaphalium & polycephalum causticum).     omega-3 acid ethyl esters (LOVAZA) 1 g capsule Take 2 capsules (2  g total) by mouth 2 (two) times daily. 360 capsule 1   polyethylene glycol (MIRALAX / GLYCOLAX) packet Take 17 g by mouth as needed.     rosuvastatin (CRESTOR) 20 MG tablet Take 1 tablet (20 mg total) by mouth daily. 90 tablet 1     Discharge Medications: Please see discharge summary for a list of discharge medications.  Relevant Imaging Results:  Relevant Lab Results:   Additional Information SSN: [id number], LCSW

## 2022-11-09 NOTE — NC FL2 (Signed)
[place] MEDICAID FL2 LEVEL OF CARE FORM     IDENTIFICATION  Patient Name: Wendy Robles Birthdate: Jun 10, 1929 Sex: female Admission Date (Current Location): 11/09/2022  [facility] and [place] Number:  [employer] and Address:  [facility],  [address] [place] [address]      Provider Number: [id number]  Attending Physician Name and Address:  Coral Spikes, DO  Relative Name and Phone Number:       Current Level of Care: Hospital Recommended Level of Care: Skilled Nursing Facility Prior Approval Number:    Date Approved/Denied:   PASRR Number: [id number]  Discharge Plan: SNF    Current Diagnoses: Patient Active Problem List   Diagnosis Date Noted   Hypertension 05/27/2019   BMI 29.0-29.9,adult 12/04/2014   Hereditary and idiopathic peripheral neuropathy 02/03/2014   Osteopenia 01/15/2014   Insomnia 10/17/2012   Hyperlipidemia with target LDL less than 100 10/17/2012   Abnormal EKG 07/28/2010    Orientation RESPIRATION BLADDER Height & Weight     Self, Time, Situation, Place  Normal Continent Weight: 190 lb (86.2 kg) Height:  5\' 4"  (162.6 cm)  BEHAVIORAL SYMPTOMS/MOOD NEUROLOGICAL BOWEL NUTRITION STATUS      Continent Diet (Regular)  AMBULATORY STATUS COMMUNICATION OF NEEDS Skin   Extensive Assist Verbally Normal                       Personal Care Assistance Level of Assistance  Bathing, Feeding, Dressing Bathing Assistance: Limited assistance Feeding assistance: Independent Dressing Assistance: Limited assistance     Functional Limitations Info  Sight, Hearing, Speech Sight Info: Adequate Hearing Info: Adequate Speech Info: Adequate    SPECIAL CARE FACTORS FREQUENCY  PT (By licensed PT), OT (By licensed OT)     PT Frequency: 5x week OT Frequency: 5x week            Contractures Contractures Info: Not present    Additional Factors Info  Code Status, Allergies Code Status Info: Full Allergies Info:  Neurontin, Bactrim           Current Medications (11/09/2022):  This is the current hospital active medication list Current Facility-Administered Medications  Medication Dose Route Frequency Provider Last Rate Last Admin   acetaminophen (TYLENOL) tablet 650 mg  650 mg Oral Q6H PRN Coral Spikes, DO       aspirin tablet 81 mg  81 mg Oral Daily Young, Travis J, DO       furosemide (LASIX) tablet 20 mg  20 mg Oral Daily Young, Travis J, DO       polyethylene glycol (MIRALAX / GLYCOLAX) packet 17 g  17 g Oral PRN Coral Spikes, DO       rosuvastatin (CRESTOR) tablet 20 mg  20 mg Oral Daily Young, Travis J, DO       Current Outpatient Medications  Medication Sig Dispense Refill   aspirin 81 MG tablet Take 81 mg by mouth daily.     CALCIUM PO Take by mouth.     cetirizine (ZYRTEC) 10 MG tablet Take 1/2 (one-half) tablet by mouth once daily 45 tablet 1   Cholecalciferol (VITAMIN D) 2000 units CAPS Take 1 capsule 2 (two) times daily by mouth.      diclofenac Sodium (VOLTAREN) 1 % GEL Apply 4 g topically 4 (four) times daily. 350 g 1   doxycycline (VIBRA-TABS) 100 MG tablet Take 1 tablet (100 mg total) by mouth 2 (two) times daily. 1 po bid  20 tablet 0   furosemide (LASIX) 20 MG tablet Take 1 tablet (20 mg total) by mouth daily. 90 tablet 1   ibuprofen (ADVIL) 200 MG tablet Take 400 mg by mouth every 8 (eight) hours as needed (pain).     meloxicam (MOBIC) 7.5 MG tablet Take one tablet once after eating. 30 tablet 2   Multiple Vitamin (MULTIVITAMIN) capsule Take 1 capsule by mouth daily.     NONFORMULARY OR COMPOUNDED ITEM Apply 1 application topically as needed (Relaxing leg cream: Gnaphalium & polycephalum causticum).     omega-3 acid ethyl esters (LOVAZA) 1 g capsule Take 2 capsules (2 g total) by mouth 2 (two) times daily. 360 capsule 1   polyethylene glycol (MIRALAX / GLYCOLAX) packet Take 17 g by mouth as needed.     rosuvastatin (CRESTOR) 20 MG tablet Take 1 tablet (20 mg total) by  mouth daily. 90 tablet 1     Discharge Medications: Please see discharge summary for a list of discharge medications.  Relevant Imaging Results:  Relevant Lab Results:   Additional Information SSN: [id number] LCSW

## 2022-11-09 NOTE — ED Notes (Signed)
TOC consulted for SNF placement. CSW updated RN and MD that there is no PT order at this time, CSW requested MD place one. TOC will assist with SNF referral once pt has been evaluated by PT and their recommendations have been made. TOC to follow.

## 2022-11-09 NOTE — ED Notes (Signed)
ED TO INPATIENT HANDOFF REPORT  ED Nurse Name and Phone #: Wandra Mannan, Paramedic  S Name/Age/Gender Wendy Robles 89 y.o. female Room/Bed: APA07/APA07  Code Status   Code Status: Limited: Do not attempt resuscitation (DNR) -DNR-LIMITED -Do Not Intubate/DNI   Home/SNF/Other Skilled nursing facility Patient oriented to: self and place Is this baseline? Yes   Triage Complete: Triage complete  Chief Complaint UTI (urinary tract infection) [N39.0]  Triage Note Pt fell in hallway. She called ems to pick her up and they put her on her lift chair. Pt states she has sat in her chair all night urinating on herself but never called ems and had her phone. Pt a/o. Pt states she thinks she had shingles. Red, scabby rash noted to left upper flank around to LUQ. States has been there x 3 weeks. Denies pain there. Pt had bruising and abrasion noted to LL leg lateral. C/o gen weakness   Allergies Allergies  Allergen Reactions   Neurontin [Gabapentin]    Bactrim [Sulfamethoxazole-Trimethoprim] Rash    Level of Care/Admitting Diagnosis ED Disposition     ED Disposition  Admit   Condition  --   Comment  Hospital Area: [facility] [[id number]]  Level of Care: Med-Surg [16]  Covid Evaluation: Asymptomatic - no recent exposure (last 10 days) testing not required  Diagnosis: UTI (urinary tract infection) [784696]  Admitting Physician: Chiquita Loth  Attending Physician: Randol Kern, DAWOOD S [[id number]]          B Medical/Surgery History Past Medical History:  Diagnosis Date   Arthritis    Colon polyps    Diverticulosis    Dyslipidemia    HTN (hypertension)    White coat   Hyperlipidemia    Osteoporosis    Past Surgical History:  Procedure Laterality Date   CARPAL TUNNEL RELEASE     HEMORRHOID SURGERY     HEMORROIDECTOMY     TOTAL ABDOMINAL HYSTERECTOMY       A IV Location/Drains/Wounds Patient Lines/Drains/Airways Status     Active  Line/Drains/Airways     Name Placement date Placement time Site Days   Peripheral IV 11/09/22 20 G 1" Right;Lateral Forearm 11/09/22  0917  Forearm  less than 1            Intake/Output Last 24 hours No intake or output data in the 24 hours ending 11/09/22 1943  Labs/Imaging Results for orders placed or performed during the hospital encounter of 11/09/22 (from the past 48 hour(s))  Basic metabolic panel     Status: Abnormal   Collection Time: 11/09/22  9:07 AM  Result Value Ref Range   Sodium 138 135 - 145 mmol/L   Potassium 3.8 3.5 - 5.1 mmol/L   Chloride 101 98 - 111 mmol/L   CO2 26 22 - 32 mmol/L   Glucose, Bld 116 (H) 70 - 99 mg/dL    Comment: Glucose reference range applies only to samples taken after fasting for at least 8 hours.   BUN 25 (H) 8 - 23 mg/dL   Creatinine, Ser 2.95 0.44 - 1.00 mg/dL   Calcium 9.4 8.9 - 28.4 mg/dL   GFR, Estimated >13 >24 mL/min    Comment: (NOTE) Calculated using the CKD-EPI Creatinine Equation (2021)    Anion gap 11 5 - 15    Comment: Performed at [facility], [address], [place], [place] [address]  CBC     Status: Abnormal   Collection Time: 11/09/22  9:07 AM  Result Value  Ref Range   WBC 12.8 (H) 4.0 - 10.5 K/uL   RBC 4.30 3.87 - 5.11 MIL/uL   Hemoglobin 13.2 12.0 - 15.0 g/dL   HCT 16.1 09.6 - 04.5 %   MCV 94.4 80.0 - 100.0 fL   MCH 30.7 26.0 - 34.0 pg   MCHC 32.5 30.0 - 36.0 g/dL   RDW 40.9 81.1 - 91.4 %   Platelets 155 150 - 400 K/uL   nRBC 0.0 0.0 - 0.2 %    Comment: Performed at [facility], [address] [place], [place] [address]  Hepatic function panel     Status: Abnormal   Collection Time: 11/09/22  9:35 AM  Result Value Ref Range   Total Protein 6.6 6.5 - 8.1 g/dL   Albumin 3.5 3.5 - 5.0 g/dL   AST 24 15 - 41 U/L   ALT 22 0 - 44 U/L   Alkaline Phosphatase 44 38 - 126 U/L   Total Bilirubin 1.2 0.3 - 1.2 mg/dL   Bilirubin, Direct 0.3 (H) 0.0 - 0.2 mg/dL   Indirect Bilirubin 0.9 0.3 - 0.9 mg/dL    Comment:  Performed at [facility], [address], [place], [place] [address]  Lipase, blood     Status: None   Collection Time: 11/09/22  9:35 AM  Result Value Ref Range   Lipase 21 11 - 51 U/L    Comment: Performed at [facility], [address], [place], [place] [address]  Troponin I (High Sensitivity)     Status: Abnormal   Collection Time: 11/09/22  9:35 AM  Result Value Ref Range   Troponin I (High Sensitivity) 22 (H) <18 ng/L    Comment: (NOTE) Elevated high sensitivity troponin I (hsTnI) values and significant  changes across serial measurements may suggest ACS but many other  chronic and acute conditions are known to elevate hsTnI results.  Refer to the "Links" section for chest pain algorithms and additional  guidance. Performed at [facility], [address], [place], [place] [address]   Urinalysis, Routine w reflex microscopic -Urine, Clean Catch     Status: Abnormal   Collection Time: 11/09/22  9:40 AM  Result Value Ref Range   Color, Urine YELLOW YELLOW   APPearance CLOUDY (A) CLEAR   Specific Gravity, Urine 1.017 1.005 - 1.030   pH 7.0 5.0 - 8.0   Glucose, UA NEGATIVE NEGATIVE mg/dL   Hgb urine dipstick SMALL (A) NEGATIVE   Bilirubin Urine NEGATIVE NEGATIVE   Ketones, ur 5 (A) NEGATIVE mg/dL   Protein, ur 952 (A) NEGATIVE mg/dL   Nitrite NEGATIVE NEGATIVE   Leukocytes,Ua LARGE (A) NEGATIVE   RBC / HPF 6-10 0 - 5 RBC/hpf   WBC, UA >50 0 - 5 WBC/hpf   Bacteria, UA RARE (A) NONE SEEN   Squamous Epithelial / HPF 0-5 0 - 5 /HPF   WBC Clumps PRESENT    Mucus PRESENT    Budding Yeast PRESENT    Non Squamous Epithelial 0-5 (A) NONE SEEN    Comment: Performed at [facility], [address], [place], [place] [address]  Troponin I (High Sensitivity)     Status: Abnormal   Collection Time: 11/09/22 11:22 AM  Result Value Ref Range   Troponin I (High Sensitivity) 21 (H) <18 ng/L    Comment: (NOTE) Elevated high sensitivity troponin I (hsTnI) values and significant  changes  across serial measurements may suggest ACS but many other  chronic and acute conditions are known to elevate hsTnI  results.  Refer to the "Links" section for chest pain algorithms and additional  guidance. Performed at [facility], [address], [place], [place] [address]    DG Pelvis 1-2 Views  Result Date: 11/09/2022 CLINICAL DATA:  Lateral left leg bruising and abrasion following a fall. EXAM: PELVIS - 1-2 VIEW COMPARISON:  None Available. FINDINGS: Mild-to-moderate bilateral hip degenerative changes. No fracture or dislocation seen. Diffuse osteopenia. IMPRESSION: 1. No fracture. 2. Mild-to-moderate bilateral hip degenerative changes. Electronically Signed   By: Beckie Salts M.D.   On: 11/09/2022 11:07   DG Tibia/Fibula Left  Result Date: 11/09/2022 CLINICAL DATA:  Fall.  Left leg bruising and abrasion.  Weakness. EXAM: LEFT TIBIA AND FIBULA - 2 VIEW COMPARISON:  None Available. FINDINGS: No acute fracture is identified. The knee and ankle are located. There is prominent chondrocalcinosis at the knee involving the medial and lateral compartments. Atherosclerotic vascular calcification is noted. IMPRESSION: No acute osseous abnormality identified. Electronically Signed   By: Sebastian Ache M.D.   On: 11/09/2022 11:05   CT Head Wo Contrast  Result Date: 11/09/2022 CLINICAL DATA:  Head trauma, minor (Age >= 65y) EXAM: CT HEAD WITHOUT CONTRAST TECHNIQUE: Contiguous axial images were obtained from the base of the skull through the vertex without intravenous contrast. RADIATION DOSE REDUCTION: This exam was performed according to the departmental dose-optimization program which includes automated exposure control, adjustment of the mA and/or kV according to patient size and/or use of iterative reconstruction technique. COMPARISON:  None Available. FINDINGS: Brain: No evidence of acute infarction, hemorrhage, hydrocephalus, extra-axial collection or mass lesion/mass effect. There is bilateral  periventricular hypodensity, which is non-specific but most likely seen in the settings of microvascular ischemic changes. Mild in extent. Vascular: No hyperdense vessel or unexpected calcification. Intracranial arteriosclerosis. Skull: Normal. Negative for fracture or focal lesion. Sinuses/Orbits: No acute finding. Other: None. IMPRESSION: 1. No acute intracranial abnormality. No calvarial fracture. 2. Mild microvascular ischemic changes. Electronically Signed   By: Jules Schick M.D.   On: 11/09/2022 10:23   DG Chest Portable 1 View  Result Date: 11/09/2022 CLINICAL DATA:  Fall.  Rash on left flank side. EXAM: PORTABLE CHEST 1 VIEW COMPARISON:  None Available. FINDINGS: Bilateral lung fields are clear. No pneumothorax, lung mass, consolidation or lung collapse. Note is made of elevated right hemidiaphragm. Bilateral lateral costophrenic angles are clear. Normal cardio-mediastinal silhouette. No acute osseous abnormalities. No acute displaced rib fracture seen. The soft tissues are within normal limits. IMPRESSION: 1. No active cardiopulmonary disease. 2. Elevated right hemidiaphragm. Electronically Signed   By: Jules Schick M.D.   On: 11/09/2022 10:19    Pending Labs Unresulted Labs (From admission, onward)     Start     Ordered   11/10/22 0500  Basic metabolic panel  Tomorrow morning,   R        11/09/22 1913   11/10/22 0500  CBC  Tomorrow morning,   R        11/09/22 1913   11/09/22 0940  Urine Culture  Once,   R        11/09/22 0940            Vitals/Pain Today's Vitals   11/09/22 1300 11/09/22 1330 11/09/22 1415 11/09/22 1445  BP: 125/61 (!) 138/59 (!) 146/58 (!) 127/55  Pulse: 88 81 78 81  Resp: 19 19 18  (!) 24  Temp:      TempSrc:      SpO2: 95% 95% 96% 94%  Weight:  Height:      PainSc:        Isolation Precautions No active isolations  Medications Medications  aspirin chewable tablet 81 mg (81 mg Oral Given 11/09/22 1908)  polyethylene glycol (MIRALAX /  GLYCOLAX) packet 17 g (has no administration in time range)  acetaminophen (TYLENOL) tablet 650 mg (has no administration in time range)  cefTRIAXone (ROCEPHIN) 1 g in sodium chloride 0.9 % 100 mL IVPB (1 g Intravenous New Bag/Given 11/09/22 1926)  rosuvastatin (CRESTOR) tablet 20 mg (has no administration in time range)  omega-3 acid ethyl esters (LOVAZA) capsule 2 g (has no administration in time range)  heparin injection 5,000 Units (has no administration in time range)  albuterol (PROVENTIL) (2.5 MG/3ML) 0.083% nebulizer solution 2.5 mg (has no administration in time range)  hydrALAZINE (APRESOLINE) injection 5 mg (has no administration in time range)  acetaminophen (TYLENOL) tablet 650 mg (650 mg Oral Given 11/09/22 1019)  fosfomycin (MONUROL) packet 3 g (3 g Oral Given 11/09/22 1314)  polyethylene glycol (MIRALAX / GLYCOLAX) packet 17 g (17 g Oral Given 11/09/22 1908)    Mobility non-ambulatory     Focused Assessments Cardiac Assessment Handoff:    No results found for: "CKTOTAL", "CKMB", "CKMBINDEX", "TROPONINI" No results found for: "DDIMER" Does the Patient currently have chest pain? No    R Recommendations: See Admitting Provider Note  Report given to:   Additional Notes: Tachycardic, UTI, Non-ambulatory. TOC w/ possible SNF placement. Family requests assisted living at [facility]. They were instructed to speak with TOC and PT team.

## 2022-11-09 NOTE — ED Triage Notes (Signed)
Pt fell in hallway. She called ems to pick her up and they put her on her lift chair. Pt states she has sat in her chair all night urinating on herself but never called ems and had her phone. Pt a/o. Pt states she thinks she had shingles. Red, scabby rash noted to left upper flank around to LUQ. States has been there x 3 weeks. Denies pain there. Pt had bruising and abrasion noted to LL leg lateral. C/o gen weakness

## 2022-11-09 NOTE — Evaluation (Signed)
Physical Therapy Evaluation Patient Details Name: Wendy Robles MRN: [id number] DOB: 26-Apr-1929 Today's Date: 11/09/2022  History of Present Illness  Pt fell in hallway. She called ems to pick her up and they put her on her lift chair. Pt states she has sat in her chair all night urinating on herself but never called ems and had her phone. Pt a/o. Pt states she thinks she had shingles. Red, scabby rash noted to left upper flank around to LUQ. States has been there x 3 weeks. Denies pain there. Pt had bruising and abrasion noted to LL leg lateral. C/o gen weakness   Clinical Impression  Patient demonstrates slow labored movement for sitting up at bedside requiring HOB raised and use bed rail due to weakness, limited to standing with RW due to BLE weakness with buckling of knees and unable to transfer to chair due to fall risk.  Patient put back to bed with Max assist to reposition.  Patient will benefit from continued skilled physical therapy in hospital and recommended venue below to increase strength, balance, endurance for safe ADLs and gait.          If plan is discharge home, recommend the following: A lot of help with walking and/or transfers;A lot of help with bathing/dressing/bathroom;Assistance with cooking/housework;Help with stairs or ramp for entrance   Can travel by private vehicle   No    Equipment Recommendations None recommended by PT  Recommendations for Other Services       Functional Status Assessment Patient has had a recent decline in their functional status and demonstrates the ability to make significant improvements in function in a reasonable and predictable amount of time.     Precautions / Restrictions Precautions Precautions: Fall Restrictions Weight Bearing Restrictions: No      Mobility  Bed Mobility Overal bed mobility: Needs Assistance Bed Mobility: Supine to Sit, Sit to Supine     Supine to sit: Mod assist, Max assist Sit to supine: Mod  assist   General bed mobility comments: slow labored movement, c/o increasing low back pain    Transfers Overall transfer level: Needs assistance Equipment used: Rolling walker (2 wheels) Transfers: Sit to/from Stand Sit to Stand: Max assist           General transfer comment: poor tolerance for standing using RW due to BLE weakness, knees buckling    Ambulation/Gait                  Stairs            Wheelchair Mobility     Tilt Bed    Modified Rankin (Stroke Patients Only)       Balance Overall balance assessment: Needs assistance Sitting-balance support: Feet unsupported, No upper extremity supported, Bilateral upper extremity supported Sitting balance-Leahy Scale: Fair Sitting balance - Comments: seated at EOB   Standing balance support: Reliant on assistive device for balance, During functional activity, Bilateral upper extremity supported Standing balance-Leahy Scale: Poor Standing balance comment: using RW                             Pertinent Vitals/Pain Pain Assessment Pain Assessment: Faces Faces Pain Scale: Hurts little more Pain Location: low back Pain Descriptors / Indicators: Aching, Sore Pain Intervention(s): Limited activity within patient's tolerance, Monitored during session, Repositioned    Home Living Family/patient expects to be discharged to:: Private residence Living Arrangements: Alone Available Help at Discharge: Friend(s);Available PRN/intermittently  Type of Home: Apartment Home Access: Level entry       Home Layout: One level Home Equipment: Rollator (4 wheels)      Prior Function Prior Level of Function : Needs assist       Physical Assist : Mobility (physical);ADLs (physical) Mobility (physical): Bed mobility;Transfers;Gait;Stairs   Mobility Comments: household and short distanced community using Rollator ADLs Comments: Independent     Extremity/Trunk Assessment   Upper Extremity  Assessment Upper Extremity Assessment: Generalized weakness;Difficult to assess due to impaired cognition    Lower Extremity Assessment Lower Extremity Assessment: Generalized weakness    Cervical / Trunk Assessment Cervical / Trunk Assessment: Kyphotic  Communication   Communication Communication: No apparent difficulties  Cognition Arousal: Alert Behavior During Therapy: WFL for tasks assessed/performed Overall Cognitive Status: Within Functional Limits for tasks assessed                                          General Comments      Exercises     Assessment/Plan    PT Assessment Patient needs continued PT services  PT Problem List Decreased strength;Decreased activity tolerance;Decreased balance;Decreased mobility       PT Treatment Interventions DME instruction;Gait training;Stair training;Functional mobility training;Therapeutic activities;Therapeutic exercise;Balance training;Patient/family education    PT Goals (Current goals can be found in the Care Plan section)  Acute Rehab PT Goals Patient Stated Goal: return home after rehab PT Goal Formulation: With patient Time For Goal Achievement: 11/23/22 Potential to Achieve Goals: Good    Frequency Min 2X/week     Co-evaluation               AM-PAC PT "6 Clicks" Mobility  Outcome Measure Help needed turning from your back to your side while in a flat bed without using bedrails?: A Lot Help needed moving from lying on your back to sitting on the side of a flat bed without using bedrails?: A Lot Help needed moving to and from a bed to a chair (including a wheelchair)?: A Lot Help needed standing up from a chair using your arms (e.g., wheelchair or bedside chair)?: A Lot Help needed to walk in hospital room?: Total Help needed climbing 3-5 steps with a railing? : Total 6 Click Score: 10    End of Session   Activity Tolerance: Patient tolerated treatment well;Patient limited by  fatigue;Patient limited by pain Patient left: in bed;with call bell/phone within reach Nurse Communication: Mobility status PT Visit Diagnosis: Unsteadiness on feet (R26.81);Other abnormalities of gait and mobility (R26.89);Muscle weakness (generalized) (M62.81)    Time: 4098-1191 PT Time Calculation (min) (ACUTE ONLY): 24 min   Charges:   PT Evaluation $PT Eval Moderate Complexity: 1 Mod PT Treatments $Therapeutic Activity: 23-37 mins PT General Charges $$ ACUTE PT VISIT: 1 Visit         3:34 PM, 11/09/22 Ocie Bob, MPT Physical Therapist with [facility] [phone] office [phone] mobile phone

## 2022-11-09 NOTE — ED Provider Notes (Signed)
EMERGENCY DEPARTMENT AT [facility] Provider Note   CSN: [id number] Arrival date & time: 11/09/22  0845     History  No chief complaint on file.   Wendy Robles is a 89 y.o. female.  Presenting emergency department after a fall last night.  Reportedly went to the commode and stood up, pulled up her pants with some difficulty secondary to generalized weakness.  Was walking down the hall and fell.  She did hit her head, no LOC.  She EMS was able to get her up and get her to her chair.  Declined evaluation at that time.  However she was unable to get up out of the chair secondary to generalized weakness.  She states that she has had worsening global weakness over the past several months.  She notes rash to her left chest wall that has been present for 3 weeks, painful and was similar to prior shingles.  Complaining of pain to her left shin.  No headache, vision changes.        Home Medications Prior to Admission medications   Medication Sig Start Date End Date Taking? Authorizing Provider  aspirin 81 MG tablet Take 81 mg by mouth daily.    [provider]  CALCIUM PO Take by mouth.    [provider]  cetirizine (ZYRTEC) 10 MG tablet Take 1/2 (one-half) tablet by mouth once daily 05/17/21   Bennie Pierini, FNP  Cholecalciferol (VITAMIN D) 2000 units CAPS Take 1 capsule 2 (two) times daily by mouth.     [provider]  diclofenac Sodium (VOLTAREN) 1 % GEL Apply 4 g topically 4 (four) times daily. 11/29/21   Daphine Deutscher, Mary-Margaret, FNP  doxycycline (VIBRA-TABS) 100 MG tablet Take 1 tablet (100 mg total) by mouth 2 (two) times daily. 1 po bid 04/12/22   Daphine Deutscher, Mary-Margaret, FNP  furosemide (LASIX) 20 MG tablet Take 1 tablet (20 mg total) by mouth daily. 04/12/22   Daphine Deutscher, Mary-Margaret, FNP  ibuprofen (ADVIL) 200 MG tablet Take 400 mg by mouth every 8 (eight) hours as needed (pain).    [provider]  meloxicam (MOBIC) 7.5  MG tablet Take one tablet once after eating. 12/14/21   Darreld Mclean, MD  Multiple Vitamin (MULTIVITAMIN) capsule Take 1 capsule by mouth daily.    [provider]  NONFORMULARY OR COMPOUNDED ITEM Apply 1 application topically as needed (Relaxing leg cream: Gnaphalium & polycephalum causticum).    [provider]  omega-3 acid ethyl esters (LOVAZA) 1 g capsule Take 2 capsules (2 g total) by mouth 2 (two) times daily. 04/12/22   Daphine Deutscher, Mary-Margaret, FNP  polyethylene glycol (MIRALAX / GLYCOLAX) packet Take 17 g by mouth as needed.    [provider]  rosuvastatin (CRESTOR) 20 MG tablet Take 1 tablet (20 mg total) by mouth daily. 04/12/22   Bennie Pierini, FNP      Allergies    Neurontin [gabapentin] and Bactrim [sulfamethoxazole-trimethoprim]    Review of Systems   Review of Systems  Physical Exam Updated Vital Signs BP (!) 127/55   Pulse 81   Temp 98.7 F (37.1 C) (Oral)   Resp (!) 24   Ht 5\' 4"  (1.626 m)   Wt 86.2 kg   SpO2 94%   BMI 32.61 kg/m  Physical Exam Vitals and nursing note reviewed.  Constitutional:      General: She is not in acute distress.    Appearance: She is not toxic-appearing.  HENT:  Head: Normocephalic.     Nose: Nose normal.     Mouth/Throat:     Mouth: Mucous membranes are moist.  Eyes:     Conjunctiva/sclera: Conjunctivae normal.  Cardiovascular:     Rate and Rhythm: Normal rate.  Pulmonary:     Effort: Pulmonary effort is normal.     Breath sounds: Normal breath sounds.  Abdominal:     General: Abdomen is flat. There is no distension.     Palpations: Abdomen is soft.     Tenderness: There is no abdominal tenderness. There is no guarding or rebound.  Musculoskeletal:     Cervical back: Normal range of motion.     Comments: No bony tenderness to the upper extremities.  No midline spinal tenderness.  Chest wall stable nontender.  Equal strength in upper and lower extremities.  Normal sensation.  Has some  bruising to her left anterior shin.  Skin:    General: Skin is warm and dry.     Capillary Refill: Capillary refill takes less than 2 seconds.     Findings: Rash present.  Neurological:     Mental Status: She is alert and oriented to person, place, and time.  Psychiatric:        Mood and Affect: Mood normal.        Behavior: Behavior normal.     ED Results / Procedures / Treatments   Labs (all labs ordered are listed, but only abnormal results are displayed) Labs Reviewed  BASIC METABOLIC PANEL - Abnormal; Notable for the following components:      Result Value   Glucose, Bld 116 (*)    BUN 25 (*)    All other components within normal limits  CBC - Abnormal; Notable for the following components:   WBC 12.8 (*)    All other components within normal limits  URINALYSIS, ROUTINE W REFLEX MICROSCOPIC - Abnormal; Notable for the following components:   APPearance CLOUDY (*)    Hgb urine dipstick SMALL (*)    Ketones, ur 5 (*)    Protein, ur 100 (*)    Leukocytes,Ua LARGE (*)    Bacteria, UA RARE (*)    Non Squamous Epithelial 0-5 (*)    All other components within normal limits  HEPATIC FUNCTION PANEL - Abnormal; Notable for the following components:   Bilirubin, Direct 0.3 (*)    All other components within normal limits  TROPONIN I (HIGH SENSITIVITY) - Abnormal; Notable for the following components:   Troponin I (High Sensitivity) 22 (*)    All other components within normal limits  TROPONIN I (HIGH SENSITIVITY) - Abnormal; Notable for the following components:   Troponin I (High Sensitivity) 21 (*)    All other components within normal limits  LIPASE, BLOOD    EKG EKG Interpretation Date/Time:  Wednesday November 09 2022 09:03:39 EDT Ventricular Rate:  94 PR Interval:  184 QRS Duration:  116 QT Interval:  379 QTC Calculation: 474 R Axis:   -35  Text Interpretation: Sinus rhythm LVH with IVCD and secondary repol abnrm Confirmed by Estanislado Pandy [phone]) on 11/09/2022  9:31:15 AM  Radiology DG Pelvis 1-2 Views  Result Date: 11/09/2022 CLINICAL DATA:  Lateral left leg bruising and abrasion following a fall. EXAM: PELVIS - 1-2 VIEW COMPARISON:  None Available. FINDINGS: Mild-to-moderate bilateral hip degenerative changes. No fracture or dislocation seen. Diffuse osteopenia. IMPRESSION: 1. No fracture. 2. Mild-to-moderate bilateral hip degenerative changes. Electronically Signed   By: Beckie Salts M.D.   On:  11/09/2022 11:07   DG Tibia/Fibula Left  Result Date: 11/09/2022 CLINICAL DATA:  Fall.  Left leg bruising and abrasion.  Weakness. EXAM: LEFT TIBIA AND FIBULA - 2 VIEW COMPARISON:  None Available. FINDINGS: No acute fracture is identified. The knee and ankle are located. There is prominent chondrocalcinosis at the knee involving the medial and lateral compartments. Atherosclerotic vascular calcification is noted. IMPRESSION: No acute osseous abnormality identified. Electronically Signed   By: Sebastian Ache M.D.   On: 11/09/2022 11:05   CT Head Wo Contrast  Result Date: 11/09/2022 CLINICAL DATA:  Head trauma, minor (Age >= 65y) EXAM: CT HEAD WITHOUT CONTRAST TECHNIQUE: Contiguous axial images were obtained from the base of the skull through the vertex without intravenous contrast. RADIATION DOSE REDUCTION: This exam was performed according to the departmental dose-optimization program which includes automated exposure control, adjustment of the mA and/or kV according to patient size and/or use of iterative reconstruction technique. COMPARISON:  None Available. FINDINGS: Brain: No evidence of acute infarction, hemorrhage, hydrocephalus, extra-axial collection or mass lesion/mass effect. There is bilateral periventricular hypodensity, which is non-specific but most likely seen in the settings of microvascular ischemic changes. Mild in extent. Vascular: No hyperdense vessel or unexpected calcification. Intracranial arteriosclerosis. Skull: Normal. Negative for fracture or  focal lesion. Sinuses/Orbits: No acute finding. Other: None. IMPRESSION: 1. No acute intracranial abnormality. No calvarial fracture. 2. Mild microvascular ischemic changes. Electronically Signed   By: Jules Schick M.D.   On: 11/09/2022 10:23   DG Chest Portable 1 View  Result Date: 11/09/2022 CLINICAL DATA:  Fall.  Rash on left flank side. EXAM: PORTABLE CHEST 1 VIEW COMPARISON:  None Available. FINDINGS: Bilateral lung fields are clear. No pneumothorax, lung mass, consolidation or lung collapse. Note is made of elevated right hemidiaphragm. Bilateral lateral costophrenic angles are clear. Normal cardio-mediastinal silhouette. No acute osseous abnormalities. No acute displaced rib fracture seen. The soft tissues are within normal limits. IMPRESSION: 1. No active cardiopulmonary disease. 2. Elevated right hemidiaphragm. Electronically Signed   By: Jules Schick M.D.   On: 11/09/2022 10:19    Procedures Procedures    Medications Ordered in ED Medications  aspirin tablet 81 mg (has no administration in time range)  furosemide (LASIX) tablet 20 mg (has no administration in time range)  polyethylene glycol (MIRALAX / GLYCOLAX) packet 17 g (has no administration in time range)  rosuvastatin (CRESTOR) tablet 20 mg (has no administration in time range)  acetaminophen (TYLENOL) tablet 650 mg (has no administration in time range)  acetaminophen (TYLENOL) tablet 650 mg (650 mg Oral Given 11/09/22 1019)  fosfomycin (MONUROL) packet 3 g (3 g Oral Given 11/09/22 1314)    ED Course/ Medical Decision Making/ A&P Clinical Course as of 11/09/22 1643  Wed Nov 09, 2022  0941 CBC(!) Mild leukocytosis noted.  Reactionary? [TY]  1034 CT Head Wo Contrast IMPRESSION: 1. No acute intracranial abnormality. No calvarial fracture. 2. Mild microvascular ischemic changes.   [TY]  1034 DG Chest Portable 1 View MPRESSION: 1. No active cardiopulmonary disease. 2. Elevated right hemidiaphragm.   [TY]  1234 DG  Pelvis 1-2 Views IMPRESSION: 1. No fracture. 2. Mild-to-moderate bilateral hip degenerative changes   [TY]  1320 Troponin I (High Sensitivity)(!) Case discussed with cardiology; does not feel too concerned about elevated troponins.  Will evaluate patient and give further recommendations for possible syncope workup.  [TY]    Clinical Course User Index [TY] Coral Spikes, DO  Medical Decision Making 89 year old female presented emergency department for fall.  Mildly tachycardic on arrival, hemodynamically stable.  Maintaining oxygen saturation on room air.  Physical exam without overt signs of trauma other than some bruising to her left shin.  Imaging CT head negative.  Not having neck pain to indicate cervical imaging.  Chest x-ray negative.  Tib-fib with no fracture.  Broad workup to evaluate for global weakness.  Did have elevated troponin, but flat.  EKG without ST segment changes indicate ischemia.  She has no fever tachycardia, but does have leukocytosis 12.8.  BMP with a metabolic derangements.  Normal kidney function.  UA with what appears to be a UTI.  Given fosfomycin for UTI. case discussed with cardiology; they will see and evaluate patient.  She does not feel safe at home with her generalized weakness that has been progressively worsening over the past several months.  She would like to be placed to rehab or facility.  TOC consulted, PT is consulted. Care signed out to Dr. Eber Hong.   Amount and/or Complexity of Data Reviewed Labs: ordered. Decision-making details documented in ED Course. Radiology: ordered. Decision-making details documented in ED Course.  Risk OTC drugs. Prescription drug management.          Final Clinical Impression(s) / ED Diagnoses Final diagnoses:  Generalized weakness    Rx / DC Orders ED Discharge Orders     None         Coral Spikes, DO 11/09/22 1643

## 2022-11-09 NOTE — H&P (Signed)
[facility] H&P   Patient Demographics:    Wendy Robles, is a 89 y.o. female  MRN: [id number]   DOB - 01-15-1930  Admit Date - 11/09/2022  Outpatient Primary MD for the patient is Bennie Pierini, FNP  Referring MD/NP/PA: Dr Hyacinth Meeker  Patient coming from: home  No chief complaint on file.     HPI:    Titianna Loomis  is a 89 y.o. female, with past medical history of hypertension, hyperlipidemia, osteoporosis, she presents to ED secondary to fall, patient reports she had a fall last night, she lives by herself, reports he fell while standing up from the commode trying to pull up her pants, reported this fall secondary to generalized weakness, fatigue for last couple days, she denies head trauma, loss of consciousness, but she does report dysuria, polyuria, EMS were able to get her to the chair yesterday but she declined further evaluation, but he was unable to get out of the chair secondary to generalized weakness, so she was brought to ED for further evaluation. -In ED her workup significant for mild leukocytosis, UA was strongly positive for UTI, she was started on Rocephin, urine culture are still pending, she was seen even by PT with recommendation for subacute rehab, given her UTI, generalized weakness, [facility] hospitalist consulted to admit.    Review of systems:      A full 10 point Review of Systems was done, except as stated above, all other Review of Systems were negative.   With Past History of the following :    Past Medical History:  Diagnosis Date   Arthritis    Colon polyps    Diverticulosis    Dyslipidemia    HTN (hypertension)    White coat   Hyperlipidemia    Osteoporosis       Past Surgical History:  Procedure Laterality Date   CARPAL TUNNEL RELEASE     HEMORRHOID SURGERY     HEMORROIDECTOMY     TOTAL ABDOMINAL HYSTERECTOMY        Social History:      Social History   Tobacco Use   Smoking status: Never    Passive exposure: Never   Smokeless tobacco: Never  Substance Use Topics   Alcohol use: No        Family History :     Family History  Problem Relation Age of Onset   Coronary artery disease Father 20   Neuropathy Father    Diabetes Father    Hypertension Father    Heart attack Father    Dementia Mother    Healthy Sister    COPD Brother    COPD Brother    COPD Brother    Diabetes Sister    Cancer Son        oral      Home Medications:   Prior to Admission medications   Medication Sig Start Date  End Date Taking? Authorizing Provider  aspirin 81 MG tablet Take 81 mg by mouth daily.   Yes [provider]  Cholecalciferol (VITAMIN D) 2000 units CAPS Take 1 capsule 2 (two) times daily by mouth.    Yes [provider]  diclofenac Sodium (VOLTAREN) 1 % GEL Apply 4 g topically 4 (four) times daily. 11/29/21  Yes Martin, Mary-Margaret, FNP  ibuprofen (ADVIL) 200 MG tablet Take 400 mg by mouth in the morning and at bedtime.   Yes [provider]  omega-3 acid ethyl esters (LOVAZA) 1 g capsule Take 2 capsules (2 g total) by mouth 2 (two) times daily. 04/12/22  Yes Martin, Mary-Margaret, FNP  polyethylene glycol (MIRALAX / GLYCOLAX) packet Take 17 g by mouth as needed.   Yes [provider]  rosuvastatin (CRESTOR) 20 MG tablet Take 1 tablet (20 mg total) by mouth daily. 04/12/22  Yes Daphine Deutscher Mary-Margaret, FNP     Allergies:     Allergies  Allergen Reactions   Neurontin [Gabapentin]    Bactrim [Sulfamethoxazole-Trimethoprim] Rash     Physical Exam:   Vitals  Blood pressure (!) 127/55, pulse 81, temperature 98.7 F (37.1 C), temperature source Oral, resp. rate (!) 24, height 5\' 4"  (1.626 m), weight 86.2 kg, SpO2 94%.   1. General Elderly female, laying in bed, no apparent distress  2. Normal affect and insight, Not Suicidal or Homicidal, Awake Alert, Oriented X 3.  3.  No F.N deficits, ALL C.Nerves Intact, Strength 5/5 all 4 extremities, Sensation intact all 4 extremities, Plantars down going.  4. Ears and Eyes appear Normal, Conjunctivae clear, PERRLA. Moist Oral Mucosa.  5. Supple Neck, No JVD, No cervical lymphadenopathy appriciated, No Carotid Bruits.  6. Symmetrical Chest wall movement, Good air movement bilaterally, CTAB.  7. RRR, No Gallops, Rubs or Murmurs, No Parasternal Heave.  8. Positive Bowel Sounds, Abdomen Soft, No tenderness, No organomegaly appriciated,No rebound -guarding or rigidity.  9.  No Cyanosis, Normal Skin Turgor, No Skin Rash or Bruise.  10. Good muscle tone,  joints appear normal , no effusions, Normal ROM.     Data Review:    CBC Recent Labs  Lab 11/09/22 0907  WBC 12.8*  HGB 13.2  HCT 40.6  PLT 155  MCV 94.4  MCH 30.7  MCHC 32.5  RDW 13.2   ------------------------------------------------------------------------------------------------------------------  Chemistries  Recent Labs  Lab 11/09/22 0907 11/09/22 0935  NA 138  --   K 3.8  --   CL 101  --   CO2 26  --   GLUCOSE 116*  --   BUN 25*  --   CREATININE 0.62  --   CALCIUM 9.4  --   AST  --  24  ALT  --  22  ALKPHOS  --  44  BILITOT  --  1.2   ------------------------------------------------------------------------------------------------------------------ estimated creatinine clearance is 48.7 mL/min (by C-G formula based on SCr of 0.62 mg/dL). ------------------------------------------------------------------------------------------------------------------ No results for input(s): "TSH", "T4TOTAL", "T3FREE", "THYROIDAB" in the last 72 hours.  Invalid input(s): "FREET3"  Coagulation profile No results for input(s): "INR", "PROTIME" in the last 168 hours. ------------------------------------------------------------------------------------------------------------------- No results for input(s): "DDIMER" in the last 72  hours. -------------------------------------------------------------------------------------------------------------------  Cardiac Enzymes No results for input(s): "CKMB", "TROPONINI", "MYOGLOBIN" in the last 168 hours.  Invalid input(s): "CK" ------------------------------------------------------------------------------------------------------------------ No results found for: "BNP"   ---------------------------------------------------------------------------------------------------------------  Urinalysis    Component Value Date/Time   COLORURINE YELLOW 11/09/2022 0940   APPEARANCEUR CLOUDY (A) 11/09/2022 0940   APPEARANCEUR Clear  04/12/2022 1429   LABSPEC 1.017 11/09/2022 0940   PHURINE 7.0 11/09/2022 0940   GLUCOSEU NEGATIVE 11/09/2022 0940   HGBUR SMALL (A) 11/09/2022 0940   BILIRUBINUR NEGATIVE 11/09/2022 0940   BILIRUBINUR Negative 04/12/2022 1429   KETONESUR 5 (A) 11/09/2022 0940   PROTEINUR 100 (A) 11/09/2022 0940   UROBILINOGEN negative 10/16/2013 1455   NITRITE NEGATIVE 11/09/2022 0940   LEUKOCYTESUR LARGE (A) 11/09/2022 0940    ----------------------------------------------------------------------------------------------------------------   Imaging Results:    DG Pelvis 1-2 Views  Result Date: 11/09/2022 CLINICAL DATA:  Lateral left leg bruising and abrasion following a fall. EXAM: PELVIS - 1-2 VIEW COMPARISON:  None Available. FINDINGS: Mild-to-moderate bilateral hip degenerative changes. No fracture or dislocation seen. Diffuse osteopenia. IMPRESSION: 1. No fracture. 2. Mild-to-moderate bilateral hip degenerative changes. Electronically Signed   By: Beckie Salts M.D.   On: 11/09/2022 11:07   DG Tibia/Fibula Left  Result Date: 11/09/2022 CLINICAL DATA:  Fall.  Left leg bruising and abrasion.  Weakness. EXAM: LEFT TIBIA AND FIBULA - 2 VIEW COMPARISON:  None Available. FINDINGS: No acute fracture is identified. The knee and ankle are located. There is prominent  chondrocalcinosis at the knee involving the medial and lateral compartments. Atherosclerotic vascular calcification is noted. IMPRESSION: No acute osseous abnormality identified. Electronically Signed   By: Sebastian Ache M.D.   On: 11/09/2022 11:05   CT Head Wo Contrast  Result Date: 11/09/2022 CLINICAL DATA:  Head trauma, minor (Age >= 65y) EXAM: CT HEAD WITHOUT CONTRAST TECHNIQUE: Contiguous axial images were obtained from the base of the skull through the vertex without intravenous contrast. RADIATION DOSE REDUCTION: This exam was performed according to the departmental dose-optimization program which includes automated exposure control, adjustment of the mA and/or kV according to patient size and/or use of iterative reconstruction technique. COMPARISON:  None Available. FINDINGS: Brain: No evidence of acute infarction, hemorrhage, hydrocephalus, extra-axial collection or mass lesion/mass effect. There is bilateral periventricular hypodensity, which is non-specific but most likely seen in the settings of microvascular ischemic changes. Mild in extent. Vascular: No hyperdense vessel or unexpected calcification. Intracranial arteriosclerosis. Skull: Normal. Negative for fracture or focal lesion. Sinuses/Orbits: No acute finding. Other: None. IMPRESSION: 1. No acute intracranial abnormality. No calvarial fracture. 2. Mild microvascular ischemic changes. Electronically Signed   By: Jules Schick M.D.   On: 11/09/2022 10:23   DG Chest Portable 1 View  Result Date: 11/09/2022 CLINICAL DATA:  Fall.  Rash on left flank side. EXAM: PORTABLE CHEST 1 VIEW COMPARISON:  None Available. FINDINGS: Bilateral lung fields are clear. No pneumothorax, lung mass, consolidation or lung collapse. Note is made of elevated right hemidiaphragm. Bilateral lateral costophrenic angles are clear. Normal cardio-mediastinal silhouette. No acute osseous abnormalities. No acute displaced rib fracture seen. The soft tissues are within  normal limits. IMPRESSION: 1. No active cardiopulmonary disease. 2. Elevated right hemidiaphragm. Electronically Signed   By: Jules Schick M.D.   On: 11/09/2022 10:19       Assessment & Plan:    Principal Problem:   UTI (urinary tract infection) Active Problems:   Hyperlipidemia with target LDL less than 100   Hypertension    Fall Generalized weakness-deconditioning -This is most likely in the setting of UTI, dehydration, -PT/OT consulted, recommendation for subacute rehab, TOC consult was placed  UTI -reports dysuria, polyuria, continue with IV Rocephin and follow urine cultures  hyperlipidemia  -continue with Crestor and Lovaza  History of hypertension -Blood pressure is acceptable to mildly elevated, she does not appear to  be on any meds, will keep on as needed hydralazine   DVT Prophylaxis Heparin   AM Labs Ordered, also please review Full Orders  Family Communication: Admission, patients condition and plan of care including tests being ordered have been discussed with the patient who indicate understanding and agree with the plan and Code Status.  Code Status, this was confirmed by the patient, and she did not request we provide her with DNR form at time of discharge  Likely DC to Will need SNF  Condition GUARDED   Consults called: None  Admission status: Observation  Time spent in minutes : 5 minutes   Huey Bienenstock M.D on 11/09/2022 at 7:14 PM   [facility] - Office  [phone]

## 2022-11-09 NOTE — Plan of Care (Signed)
  Problem: Acute Rehab PT Goals(only PT should resolve) Goal: Pt Will Go Supine/Side To Sit Outcome: Progressing Flowsheets (Taken 11/09/2022 1536) Pt will go Supine/Side to Sit:  with minimal assist  with moderate assist Goal: Patient Will Transfer Sit To/From Stand Outcome: Progressing Flowsheets (Taken 11/09/2022 1536) Patient will transfer sit to/from stand:  with minimal assist  with moderate assist Goal: Pt Will Transfer Bed To Chair/Chair To Bed Outcome: Progressing Flowsheets (Taken 11/09/2022 1536) Pt will Transfer Bed to Chair/Chair to Bed: with mod assist Goal: Pt Will Ambulate Outcome: Progressing Flowsheets (Taken 11/09/2022 1536) Pt will Ambulate:  10 feet  with moderate assist  with rolling walker   3:37 PM, 11/09/22 Ocie Bob, MPT Physical Therapist with [facility] [phone] office [phone] mobile phone

## 2022-11-09 NOTE — ED Notes (Signed)
PT recommending SNF rehab. Met with pt to review. Pt agreeable. CMS provider options and Medicare ratings reviewed with pt. Will refer as requested. Once SNF bed offer received and accepted, TOC will start auth.

## 2022-11-10 DIAGNOSIS — E876 Hypokalemia: Secondary | ICD-10-CM

## 2022-11-10 DIAGNOSIS — E871 Hypo-osmolality and hyponatremia: Secondary | ICD-10-CM

## 2022-11-10 DIAGNOSIS — N3000 Acute cystitis without hematuria: Secondary | ICD-10-CM | POA: Diagnosis not present

## 2022-11-10 DIAGNOSIS — R531 Weakness: Secondary | ICD-10-CM | POA: Diagnosis not present

## 2022-11-10 LAB — CBC
HCT: 36.5 % (ref 36.0–46.0)
Hemoglobin: 11.7 g/dL — ABNORMAL LOW (ref 12.0–15.0)
MCH: 29.9 pg (ref 26.0–34.0)
MCHC: 32.1 g/dL (ref 30.0–36.0)
MCV: 93.4 fL (ref 80.0–100.0)
Platelets: 149 10*3/uL — ABNORMAL LOW (ref 150–400)
RBC: 3.91 MIL/uL (ref 3.87–5.11)
RDW: 13 % (ref 11.5–15.5)
WBC: 11.1 10*3/uL — ABNORMAL HIGH (ref 4.0–10.5)
nRBC: 0 % (ref 0.0–0.2)

## 2022-11-10 LAB — BASIC METABOLIC PANEL
Anion gap: 9 (ref 5–15)
BUN: 24 mg/dL — ABNORMAL HIGH (ref 8–23)
CO2: 23 mmol/L (ref 22–32)
Calcium: 8.6 mg/dL — ABNORMAL LOW (ref 8.9–10.3)
Chloride: 100 mmol/L (ref 98–111)
Creatinine, Ser: 0.55 mg/dL (ref 0.44–1.00)
GFR, Estimated: >60 mL/min (ref >60)
Glucose, Bld: 129 mg/dL — ABNORMAL HIGH (ref 70–99)
Potassium: 3 mmol/L — ABNORMAL LOW (ref 3.5–5.1)
Sodium: 132 mmol/L — ABNORMAL LOW (ref 135–145)

## 2022-11-10 LAB — MAGNESIUM: Magnesium: 2 mg/dL (ref 1.7–2.4)

## 2022-11-10 MED ORDER — TUBERCULIN PPD 5 UNIT/0.1ML ID SOLN
5.0000 [IU] | Freq: Once | INTRADERMAL | Status: DC
  Administered 2022-11-10: 5 [IU] via INTRADERMAL
  Filled 2022-11-10: qty 0.1

## 2022-11-10 MED ORDER — POTASSIUM CHLORIDE CRYS ER 20 MEQ PO TBCR
40.0000 meq | EXTENDED_RELEASE_TABLET | Freq: Three times a day (TID) | ORAL | Status: AC
  Administered 2022-11-10: 40 meq via ORAL
  Filled 2022-11-10 (×2): qty 2

## 2022-11-10 NOTE — Progress Notes (Addendum)
[facility] PROGRESS NOTE   [name] MVH:[id number] DOB: [birth year] DOA: 11/09/2022  PCP: Bennie Pierini, FNP  Brief History: 89 y.o. female, with past medical history of hypertension, hyperlipidemia, osteoporosis, presented to ED secondary to fall.  Patient had been having generalized weakness for few days prior to admission.  She also mentioned dysuria and polyuria.  UA was noted to be abnormal.  She was started on antibiotics and was hospitalized for further management.  Consultants: None  Procedures: None    Subjective/Interval History: Patient mentions that she feels slightly better this morning but still feels quite fatigued.  Denies any abdominal pain nausea or vomiting.  No chest pain or shortness of breath.    Assessment/Plan:  Urinary tract infection Patient had symptoms with dysuria and polyuria.  UA noted to be abnormal.  Patient started on ceftriaxone.  Follow-up on urine cultures.  Generalized weakness/fall Most likely secondary to UTI.  PT OT.  SNF is recommended for short-term rehab.  TOC consulted.  Hyponatremia/hypokalemia Potassium will be repleted.  Encourage oral intake.  Recheck labs tomorrow morning.  Magnesium level 2.0.  Rash in the right flank area/herpes zoster Has been ongoing for about 3 weeks.  Possible herpes zoster though no active lesions noted.  The rash is very dry.  Cousin mentions that the rash looks much better compared to how it was.  Does not appear to be active shingles.  Normocytic anemia/thrombocytopenia Likely dilutional drop in hemoglobin.  Recheck labs tomorrow.  Elevated blood pressure readings Does not appear to have a diagnosis of hypertension.  Not on any blood pressure lowering agents at home.  Blood pressure has improved.  Continue to monitor.  Hyperlipidemia Continue with home medications.  Obesity Estimated body mass index is 32.61 kg/m as calculated from the following:   Height as of this  encounter: 5\' 4"  (1.626 m).   Weight as of this encounter: 86.2 kg.  DVT Prophylaxis: Subcutaneous heparin Code Status: DNR Family Communication: Discussed with patient.  No family at bedside Disposition Plan: SNF  Status is: Observation The patient will require care spanning > 2 midnights and should be moved to inpatient because: Generalized weakness, urinary tract infection, unsafe discharge      Medications: Scheduled:  aspirin  81 mg Oral q1800   heparin  5,000 Units Subcutaneous Q8H   omega-3 acid ethyl esters  2 capsule Oral BID   potassium chloride  40 mEq Oral TID   rosuvastatin  20 mg Oral q1800   Continuous:  cefTRIAXone (ROCEPHIN)  IV 1 g (11/09/22 1926)   WUX:[id number], albuterol, hydrALAZINE, polyethylene glycol  Antibiotics: Anti-infectives (From admission, onward)    Start     Dose/Rate Route Frequency Ordered Stop   11/09/22 1800  cefTRIAXone (ROCEPHIN) 1 g in sodium chloride 0.9 % 100 mL IVPB        1 g 200 mL/hr over 30 Minutes Intravenous Every 24 hours 11/09/22 1649     11/09/22 1300  fosfomycin (MONUROL) packet 3 g        3 g Oral  Once 11/09/22 1258 11/09/22 1314       Objective:  Vital Signs  Vitals:   11/09/22 2102 11/10/22 0012 11/10/22 0203 11/10/22 0607  BP: (!) 169/77 126/63 (!) 142/69 119/70  Pulse: (!) 110 (!) 107 (!) 105 91  Resp: 19 19 19 20   Temp:  99.3 F (37.4 C) 98.9 F (37.2 C) 99.6 F (37.6 C)  TempSrc:  Oral Oral Oral  SpO2: 96% 96%  97% 94%  Weight:      Height:       No intake or output data in the 24 hours ending 11/10/22 0837 Filed Weights   11/09/22 1050  Weight: 86.2 kg    General appearance: Awake alert.  In no distress Resp: Clear to auscultation bilaterally.  Normal effort Cardio: S1-S2 is normal regular.  No S3-S4.  No rubs murmurs or bruit GI: Abdomen is soft.  Nontender nondistended.  Bowel sounds are present normal.  No masses organomegaly Extremities: No edema.  Physical deconditioning is  noted.  She was able to lift her legs of the bed. Neurologic: Alert and oriented x3.  No focal neurological deficits.    Lab Results:  Data Reviewed: I have personally reviewed following labs and reports of the imaging studies  CBC: Recent Labs  Lab 11/09/22 0907 11/10/22 0449  WBC 12.8* 11.1*  HGB 13.2 11.7*  HCT 40.6 36.5  MCV 94.4 93.4  PLT 155 149*    Basic Metabolic Panel: Recent Labs  Lab 11/09/22 0907 11/10/22 0449  NA 138 132*  K 3.8 3.0*  CL 101 100  CO2 26 23  GLUCOSE 116* 129*  BUN 25* 24*  CREATININE 0.62 0.55  CALCIUM 9.4 8.6*  MG  --  2.0    GFR: Estimated Creatinine Clearance: 48.7 mL/min (by C-G formula based on SCr of 0.55 mg/dL).  Liver Function Tests: Recent Labs  Lab 11/09/22 0935  AST 24  ALT 22  ALKPHOS 44  BILITOT 1.2  PROT 6.6  ALBUMIN 3.5    Recent Labs  Lab 11/09/22 0935  LIPASE 21     Radiology Studies: DG Pelvis 1-2 Views  Result Date: 11/09/2022 CLINICAL DATA:  Lateral left leg bruising and abrasion following a fall. EXAM: PELVIS - 1-2 VIEW COMPARISON:  None Available. FINDINGS: Mild-to-moderate bilateral hip degenerative changes. No fracture or dislocation seen. Diffuse osteopenia. IMPRESSION: 1. No fracture. 2. Mild-to-moderate bilateral hip degenerative changes. Electronically Signed   By: Beckie Salts M.D.   On: 11/09/2022 11:07   DG Tibia/Fibula Left  Result Date: 11/09/2022 CLINICAL DATA:  Fall.  Left leg bruising and abrasion.  Weakness. EXAM: LEFT TIBIA AND FIBULA - 2 VIEW COMPARISON:  None Available. FINDINGS: No acute fracture is identified. The knee and ankle are located. There is prominent chondrocalcinosis at the knee involving the medial and lateral compartments. Atherosclerotic vascular calcification is noted. IMPRESSION: No acute osseous abnormality identified. Electronically Signed   By: Sebastian Ache M.D.   On: 11/09/2022 11:05   CT Head Wo Contrast  Result Date: 11/09/2022 CLINICAL DATA:  Head trauma,  minor (Age >= 65y) EXAM: CT HEAD WITHOUT CONTRAST TECHNIQUE: Contiguous axial images were obtained from the base of the skull through the vertex without intravenous contrast. RADIATION DOSE REDUCTION: This exam was performed according to the departmental dose-optimization program which includes automated exposure control, adjustment of the mA and/or kV according to patient size and/or use of iterative reconstruction technique. COMPARISON:  None Available. FINDINGS: Brain: No evidence of acute infarction, hemorrhage, hydrocephalus, extra-axial collection or mass lesion/mass effect. There is bilateral periventricular hypodensity, which is non-specific but most likely seen in the settings of microvascular ischemic changes. Mild in extent. Vascular: No hyperdense vessel or unexpected calcification. Intracranial arteriosclerosis. Skull: Normal. Negative for fracture or focal lesion. Sinuses/Orbits: No acute finding. Other: None. IMPRESSION: 1. No acute intracranial abnormality. No calvarial fracture. 2. Mild microvascular ischemic changes. Electronically Signed   By: Jules Schick M.D.   On:  11/09/2022 10:23   DG Chest Portable 1 View  Result Date: 11/09/2022 CLINICAL DATA:  Fall.  Rash on left flank side. EXAM: PORTABLE CHEST 1 VIEW COMPARISON:  None Available. FINDINGS: Bilateral lung fields are clear. No pneumothorax, lung mass, consolidation or lung collapse. Note is made of elevated right hemidiaphragm. Bilateral lateral costophrenic angles are clear. Normal cardio-mediastinal silhouette. No acute osseous abnormalities. No acute displaced rib fracture seen. The soft tissues are within normal limits. IMPRESSION: 1. No active cardiopulmonary disease. 2. Elevated right hemidiaphragm. Electronically Signed   By: Jules Schick M.D.   On: 11/09/2022 10:19       LOS: 0 days   Wendy Robles  [facility] Pager on [email or web address]  11/10/2022, 8:37 AM

## 2022-11-10 NOTE — TOC Progression Note (Signed)
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 MD updated. Patient needs 1-2 more days. TOC following to see if [facility] will offer a bed.    Expected Discharge Plan: Skilled Nursing Facility Barriers to Discharge: Continued Medical Work up  Expected Discharge Plan and Services                                               Social Determinants of Health (SDOH) Interventions SDOH Screenings   Food Insecurity: No Food Insecurity (11/09/2022)  Housing: Low Risk  (11/09/2022)  Transportation Needs: No Transportation Needs (11/09/2022)  Utilities: Not At Risk (11/09/2022)  Alcohol Screen: Low Risk  (01/17/2022)  Depression (PHQ2-9): Low Risk  (04/12/2022)  Financial Resource Strain: Low Risk  (01/17/2022)  Physical Activity: Inactive (01/17/2022)  Social Connections: Moderately Integrated (01/17/2022)  Stress: No Stress Concern Present (01/17/2022)  Tobacco Use: Low Risk  (11/09/2022)

## 2022-11-10 NOTE — TOC Progression Note (Signed)
Transition of Care [facility]) - Progression Note    Patient Details  Name: Wendy Robles MRN: [id number] Date of Birth: 05-08-1929  Transition of Care [facility]) CM/SW Contact  Leitha Bleak, RN Phone Number: 11/10/2022, 10:16 AM  Clinical Narrative:   CM spoke with patient, CMS provider options and Medicare ratings reviewed. Patient choose [facility]. TOC starting INS AUTH.     Expected Discharge Plan: Skilled Nursing Facility Barriers to Discharge: Continued Medical Work up  Expected Discharge Plan and Services      Social Determinants of Health (SDOH) Interventions SDOH Screenings   Food Insecurity: No Food Insecurity (11/09/2022)  Housing: Low Risk  (11/09/2022)  Transportation Needs: No Transportation Needs (11/09/2022)  Utilities: Not At Risk (11/09/2022)  Alcohol Screen: Low Risk  (01/17/2022)  Depression (PHQ2-9): Low Risk  (04/12/2022)  Financial Resource Strain: Low Risk  (01/17/2022)  Physical Activity: Inactive (01/17/2022)  Social Connections: Moderately Integrated (01/17/2022)  Stress: No Stress Concern Present (01/17/2022)  Tobacco Use: Low Risk  (11/09/2022)

## 2022-11-10 NOTE — Progress Notes (Signed)
Physical Therapy Treatment Patient Details Name: Wendy Robles MRN: [id number] DOB: 12-04-1929 Today's Date: 11/10/2022   History of Present Illness Pt fell in hallway. She called ems to pick her up and they put her on her lift chair. Pt states she has sat in her chair all night urinating on herself but never called ems and had her phone. Pt a/o. Pt states she thinks she had shingles. Red, scabby rash noted to left upper flank around to LUQ. States has been there x 3 weeks. Denies pain there. Pt had bruising and abrasion noted to LL leg lateral. C/o gen weakness    PT Comments   Pt supine and willing to participate with therapy today.  Pt limited with Lt side/trunk pain with movements and reports 5 days of conspitation that is uncomfortable as well.  Pt with mod A bed mobility and required cueing for UE movement and trunk rotation to increase ease with supine to sit.  Seated posture, core and LE strengthening exercises complete with some cueing for proper form.  Pt with tendency to lean to Rt side for Lt side pain relief.  Increased A required with sit to supine for LE and trunk due to weakness.  EOS pt left in bed with call bell within reach, RN aware of status.     If plan is discharge home, recommend the following:     Can travel by private vehicle        Equipment Recommendations       Recommendations for Other Services       Precautions / Restrictions Precautions Precautions: Fall Restrictions Weight Bearing Restrictions: No     Mobility  Bed Mobility Overal bed mobility: Needs Assistance Bed Mobility: Supine to Sit, Sit to Supine     Supine to sit: Mod assist Sit to supine: Mod assist   General bed mobility comments: slow labored movement, c/o Lt trunk pain with movements    Transfers                        Ambulation/Gait                   Stairs             Wheelchair Mobility     Tilt Bed    Modified Rankin (Stroke Patients  Only)       Balance                                            Cognition Arousal: Alert Behavior During Therapy: WFL for tasks assessed/performed Overall Cognitive Status: Within Functional Limits for tasks assessed                                          Exercises General Exercises - Lower Extremity Long Arc Quad: AROM, Both, Strengthening, 10 reps, Seated Hip ABduction/ADduction: AROM, Strengthening, Both, 10 reps, Seated Hip Flexion/Marching: AROM, Both, 10 reps, Seated Toe Raises: AROM, 10 reps, Strengthening, Both, Seated Heel Raises: AROM, Strengthening, Both, 10 reps    General Comments        Pertinent Vitals/Pain Pain Assessment Pain Assessment: 0-10 Pain Score: 8  Pain Location: Lt side/trunk with movements Pain Descriptors / Indicators: Discomfort, Stabbing, Sore Pain Intervention(s): Limited  activity within patient's tolerance, Repositioned, Monitored during session    Home Living                          Prior Function            PT Goals (current goals can now be found in the care plan section)      Frequency           PT Plan      Co-evaluation              AM-PAC PT "6 Clicks" Mobility   Outcome Measure  Help needed turning from your back to your side while in a flat bed without using bedrails?: A Lot Help needed moving from lying on your back to sitting on the side of a flat bed without using bedrails?: A Lot Help needed moving to and from a bed to a chair (including a wheelchair)?: A Lot Help needed standing up from a chair using your arms (e.g., wheelchair or bedside chair)?: A Lot Help needed to walk in hospital room?: Total Help needed climbing 3-5 steps with a railing? : Total 6 Click Score: 10    End of Session   Activity Tolerance: Patient tolerated treatment well;Patient limited by fatigue;Patient limited by pain Patient left: in bed;with call bell/phone within  reach Nurse Communication: Mobility status PT Visit Diagnosis: Unsteadiness on feet (R26.81);Other abnormalities of gait and mobility (R26.89);Muscle weakness (generalized) (M62.81)     Time: 1610-9604 PT Time Calculation (min) (ACUTE ONLY): 40 min  Charges:    $Therapeutic Exercise: 8-22 mins $Therapeutic Activity: 8-22 mins PT General Charges $$ ACUTE PT VISIT: 1 Visit                     Becky Sax, LPTA/CLT; CBIS [phone]  Juel Burrow 11/10/2022, 4:02 PM

## 2022-11-10 NOTE — Care Management Obs Status (Signed)
MEDICARE OBSERVATION STATUS NOTIFICATION   Patient Details  Name: SYMIA HERDT MRN: [id number] Date of Birth: 19-Feb-1929   Medicare Observation Status Notification Given:  Yes    Corey Harold 11/10/2022, 1:54 PM

## 2022-11-10 NOTE — Plan of Care (Signed)
  Problem: Education: Goal: Knowledge of General Education information will improve Description Including pain rating scale, medication(s)/side effects and non-pharmacologic comfort measures Outcome: Progressing   Problem: Health Behavior/Discharge Planning: Goal: Ability to manage health-related needs will improve Outcome: Progressing   

## 2022-11-10 NOTE — Progress Notes (Signed)
dmin Instructions:  A nurse work list task titled "PPD Follow-up" will appear on patient's chart in 47 hours. Please read tuberculin skin test and document appropriately.  Order ID: [id number]  Ordered Admin Dose: 0.1 mL = 5 Units of 5 Units/0.1 mL  Frequency: Once  Route: Intradermal  Ordered Dose: 5 Units  Admin Duration: 48 Hours  Last Admin: Today 11/10/22 at 1314 (Given)  Order Start Time: Today 11/10/22 at 1230  Administrations Remaining: 0 (+1 in progress)  Dispense Location: Main Pharmacy/IV Therapy  References: Micromed[employer] Drug Information   Recent Actions: 10/10 1314 Given    PPD test given Left arm and marked

## 2022-11-10 NOTE — TOC Progression Note (Signed)
Transition of Care [facility]) - Progression Note    Patient Details  Name: Wendy Robles MRN: [id number] Date of Birth: 03-22-1929  [facility]) CM/SW Contact  Leitha Bleak, RN Phone Number: 11/10/2022, 11:50 AM  Clinical Narrative:   Archie Patten, niece called to say she has called [facility] for placement. CM spoke with Chasity, she will come assess the patient tomorrow for 30 day respite care. MD order PPD, RN updated to document in a note.MD reports her shingles are not infectious, ongoing for 3 weeks. MD is saying patient can not return home. CM explain we can not hold her for ALF placement, MD does not feel she can go home alone. CM explained to [place] we do not want to give up the bed at [facility]. Auth started. TOC following to see if [facility] will accept.    Expected Discharge Plan: Skilled Nursing Facility Barriers to Discharge: Continued Medical Work up  Expected Discharge Plan and Services       Social Determinants of Health (SDOH) Interventions SDOH Screenings   Food Insecurity: No Food Insecurity (11/09/2022)  Housing: Low Risk  (11/09/2022)  Transportation Needs: No Transportation Needs (11/09/2022)  Utilities: Not At Risk (11/09/2022)  Alcohol Screen: Low Risk  (01/17/2022)  Depression (PHQ2-9): Low Risk  (04/12/2022)  Financial Resource Strain: Low Risk  (01/17/2022)  Physical Activity: Inactive (01/17/2022)  Social Connections: Moderately Integrated (01/17/2022)  Stress: No Stress Concern Present (01/17/2022)  Tobacco Use: Low Risk  (11/09/2022)    Readmission Risk Interventions     No data to display

## 2022-11-11 DIAGNOSIS — D696 Thrombocytopenia, unspecified: Secondary | ICD-10-CM | POA: Diagnosis not present

## 2022-11-11 DIAGNOSIS — E559 Vitamin D deficiency, unspecified: Secondary | ICD-10-CM | POA: Diagnosis not present

## 2022-11-11 DIAGNOSIS — N390 Urinary tract infection, site not specified: Secondary | ICD-10-CM | POA: Diagnosis not present

## 2022-11-11 DIAGNOSIS — G629 Polyneuropathy, unspecified: Secondary | ICD-10-CM | POA: Diagnosis not present

## 2022-11-11 DIAGNOSIS — M810 Age-related osteoporosis without current pathological fracture: Secondary | ICD-10-CM | POA: Diagnosis not present

## 2022-11-11 DIAGNOSIS — U071 COVID-19: Secondary | ICD-10-CM | POA: Diagnosis not present

## 2022-11-11 DIAGNOSIS — E785 Hyperlipidemia, unspecified: Secondary | ICD-10-CM | POA: Diagnosis not present

## 2022-11-11 DIAGNOSIS — K5909 Other constipation: Secondary | ICD-10-CM | POA: Diagnosis not present

## 2022-11-11 DIAGNOSIS — G8929 Other chronic pain: Secondary | ICD-10-CM | POA: Diagnosis not present

## 2022-11-11 DIAGNOSIS — R531 Weakness: Secondary | ICD-10-CM | POA: Diagnosis not present

## 2022-11-11 DIAGNOSIS — K219 Gastro-esophageal reflux disease without esophagitis: Secondary | ICD-10-CM | POA: Diagnosis not present

## 2022-11-11 DIAGNOSIS — M1990 Unspecified osteoarthritis, unspecified site: Secondary | ICD-10-CM | POA: Diagnosis not present

## 2022-11-11 DIAGNOSIS — I2510 Atherosclerotic heart disease of native coronary artery without angina pectoris: Secondary | ICD-10-CM | POA: Diagnosis not present

## 2022-11-11 DIAGNOSIS — E7849 Other hyperlipidemia: Secondary | ICD-10-CM | POA: Diagnosis not present

## 2022-11-11 DIAGNOSIS — M5489 Other dorsalgia: Secondary | ICD-10-CM | POA: Diagnosis not present

## 2022-11-11 DIAGNOSIS — Z9181 History of falling: Secondary | ICD-10-CM | POA: Diagnosis not present

## 2022-11-11 DIAGNOSIS — R2689 Other abnormalities of gait and mobility: Secondary | ICD-10-CM | POA: Diagnosis not present

## 2022-11-11 DIAGNOSIS — N3000 Acute cystitis without hematuria: Secondary | ICD-10-CM | POA: Diagnosis not present

## 2022-11-11 DIAGNOSIS — D509 Iron deficiency anemia, unspecified: Secondary | ICD-10-CM | POA: Diagnosis not present

## 2022-11-11 DIAGNOSIS — B029 Zoster without complications: Secondary | ICD-10-CM | POA: Diagnosis not present

## 2022-11-11 DIAGNOSIS — Z6832 Body mass index (BMI) 32.0-32.9, adult: Secondary | ICD-10-CM | POA: Diagnosis not present

## 2022-11-11 DIAGNOSIS — M62562 Muscle wasting and atrophy, not elsewhere classified, left lower leg: Secondary | ICD-10-CM | POA: Diagnosis not present

## 2022-11-11 DIAGNOSIS — M6281 Muscle weakness (generalized): Secondary | ICD-10-CM | POA: Diagnosis not present

## 2022-11-11 DIAGNOSIS — I10 Essential (primary) hypertension: Secondary | ICD-10-CM | POA: Diagnosis not present

## 2022-11-11 LAB — URINE CULTURE: Culture: >=100000 — AB

## 2022-11-11 LAB — BASIC METABOLIC PANEL
Anion gap: 7 (ref 5–15)
BUN: 20 mg/dL (ref 8–23)
CO2: 27 mmol/L (ref 22–32)
Calcium: 8.8 mg/dL — ABNORMAL LOW (ref 8.9–10.3)
Chloride: 100 mmol/L (ref 98–111)
Creatinine, Ser: 0.61 mg/dL (ref 0.44–1.00)
GFR, Estimated: >60 mL/min (ref >60)
Glucose, Bld: 120 mg/dL — ABNORMAL HIGH (ref 70–99)
Potassium: 4 mmol/L (ref 3.5–5.1)
Sodium: 134 mmol/L — ABNORMAL LOW (ref 135–145)

## 2022-11-11 LAB — CBC
HCT: 37.4 % (ref 36.0–46.0)
Hemoglobin: 12 g/dL (ref 12.0–15.0)
MCH: 30.1 pg (ref 26.0–34.0)
MCHC: 32.1 g/dL (ref 30.0–36.0)
MCV: 93.7 fL (ref 80.0–100.0)
Platelets: 167 10*3/uL (ref 150–400)
RBC: 3.99 MIL/uL (ref 3.87–5.11)
RDW: 13.1 % (ref 11.5–15.5)
WBC: 8.3 10*3/uL (ref 4.0–10.5)
nRBC: 0 % (ref 0.0–0.2)

## 2022-11-11 MED ORDER — RISAQUAD PO CAPS
2.0000 | ORAL_CAPSULE | Freq: Three times a day (TID) | ORAL | Status: DC
  Administered 2022-11-11: 2 via ORAL
  Filled 2022-11-11: qty 2

## 2022-11-11 MED ORDER — LACTINEX PO CHEW: 1.0000 | CHEWABLE_TABLET | Freq: Three times a day (TID) | ORAL | 0 refills | Status: AC

## 2022-11-11 MED ORDER — CIPROFLOXACIN HCL 250 MG PO TABS
500.0000 mg | ORAL_TABLET | Freq: Two times a day (BID) | ORAL | Status: DC
  Administered 2022-11-11: 500 mg via ORAL
  Filled 2022-11-11: qty 2

## 2022-11-11 MED ORDER — DOCUSATE SODIUM 100 MG PO CAPS
100.0000 mg | ORAL_CAPSULE | Freq: Two times a day (BID) | ORAL | 2 refills | Status: AC
Start: 2022-11-11 — End: 2023-11-11

## 2022-11-11 MED ORDER — SORBITOL 70 % SOLN
300.0000 mL | TOPICAL_OIL | Freq: Once | ORAL | Status: AC
  Administered 2022-11-11: 300 mL via RECTAL
  Filled 2022-11-11: qty 90

## 2022-11-11 MED ORDER — CIPROFLOXACIN HCL 500 MG PO TABS: 500.0000 mg | ORAL_TABLET | Freq: Two times a day (BID) | ORAL | 0 refills | Status: AC

## 2022-11-11 MED ORDER — MAGNESIUM CITRATE PO SOLN
1.0000 | Freq: Once | ORAL | Status: DC
  Filled 2022-11-11: qty 296

## 2022-11-11 NOTE — Evaluation (Addendum)
Occupational Therapy Evaluation Patient Details Name: Wendy Robles MRN: [id number] DOB: 08-22-1929 Today's Date: 11/11/2022   History of Present Illness Pt fell in hallway. She called ems to pick her up and they put her on her lift chair. Pt states she has sat in her chair all night urinating on herself but never called ems and had her phone. Pt a/o. Pt states she thinks she had shingles. Red, scabby rash noted to left upper flank around to LUQ. States has been there x 3 weeks. Denies pain there. Pt had bruising and abrasion noted to LL leg lateral. C/o gen weakness   Clinical Impression   Pt agreeable to OT evaluation. Pt presented anxious of falling but was agreeable to try mobility. Mod to max A needed for bed mobility. More mod A to return to bed. Max A  needed for standing with pt demonstrating poor strength and endurance in B LE. B UE both generally weak with mild limitations in A/ROM. Pt was left in the bed with bed alarm set and call bell within reach. Pt will benefit from continued OT in the hospital and recommended venue below to increase strength, balance, and endurance for safe ADL's.          If plan is discharge home, recommend the following: A lot of help with walking and/or transfers;A lot of help with bathing/dressing/bathroom;Assistance with cooking/housework;Assist for transportation;Help with stairs or ramp for entrance    Functional Status Assessment  Patient has had a recent decline in their functional status and demonstrates the ability to make significant improvements in function in a reasonable and predictable amount of time.  Equipment Recommendations  None recommended by OT           Precautions / Restrictions Precautions Precautions: Fall Restrictions Weight Bearing Restrictions: No      Mobility Bed Mobility Overal bed mobility: Needs Assistance Bed Mobility: Supine to Sit, Sit to Supine     Supine to sit: Mod assist, Max assist Sit to supine:  Mod assist   General bed mobility comments: slow labored movement; assist to bring trunk to upright position and move B LE to and from bed.    Transfers Overall transfer level: Needs assistance Equipment used: Rolling walker (2 wheels) Transfers: Sit to/from Stand Sit to Stand: Max assist           General transfer comment: Fearful of falling; very weak with poor strength.      Balance Overall balance assessment: Needs assistance Sitting-balance support: No upper extremity supported, Feet supported Sitting balance-Leahy Scale: Fair Sitting balance - Comments: seated at EOB   Standing balance support: Reliant on assistive device for balance, During functional activity, Bilateral upper extremity supported Standing balance-Leahy Scale: Poor Standing balance comment: using RW                           ADL either performed or assessed with clinical judgement   ADL Overall ADL's : Needs assistance/impaired Eating/Feeding: Set up;Bed level   Grooming: Set up;Minimal assistance;Bed level       Lower Body Bathing: Maximal assistance;Total assistance;Bed level;Sitting/lateral leans   Upper Body Dressing : Minimal assistance;Moderate assistance;Sitting   Lower Body Dressing: Maximal assistance;Sitting/lateral leans;Total assistance   Toilet Transfer: Maximal assistance;Stand-pivot;Rolling walker (2 wheels) Toilet Transfer Details (indicate cue type and reason): Partially simulated via sit to stand x2 at EOB. Toileting- Clothing Manipulation and Hygiene: Total assistance;Bed level  Vision Baseline Vision/History: 1 Wears glasses Ability to See in Adequate Light: 1 Impaired Patient Visual Report: No change from baseline Vision Assessment?: No apparent visual deficits     Perception Perception: Not tested       Praxis Praxis: Not tested       Pertinent Vitals/Pain Pain Assessment Pain Assessment: Faces Faces Pain Scale: Hurts little  more Pain Location: Lt side/trunk with movements Pain Descriptors / Indicators: Discomfort, Sore Pain Intervention(s): Limited activity within patient's tolerance, Monitored during session, Repositioned     Extremity/Trunk Assessment Upper Extremity Assessment Upper Extremity Assessment: Generalized weakness (3-/5 for shoulder flexion; generally weak otherwise.)   Lower Extremity Assessment Lower Extremity Assessment: Defer to PT evaluation   Cervical / Trunk Assessment Cervical / Trunk Assessment: Kyphotic   Communication Communication Communication: No apparent difficulties   Cognition Arousal: Alert Behavior During Therapy: Anxious, WFL for tasks assessed/performed Overall Cognitive Status: Within Functional Limits for tasks assessed                                                        Home Living Family/patient expects to be discharged to:: Private residence Living Arrangements: Alone Available Help at Discharge: Friend(s);Available PRN/intermittently Type of Home: Apartment Home Access: Level entry     Home Layout: One level     Bathroom Shower/Tub: Tub/shower unit;Sponge bathes at baseline   Bathroom Toilet: Handicapped height Bathroom Accessibility: Yes   Home Equipment: Rollator (4 wheels)   Additional Comments: per PT note      Prior Functioning/Environment Prior Level of Function : Needs assist             Mobility Comments: household and short distanced community using Rollator ADLs Comments: Independent; pt later reported assisted needed if donning socks; but able to use slides and don pants.         OT Problem List: Decreased strength;Decreased range of motion;Decreased activity tolerance;Impaired balance (sitting and/or standing)      OT Treatment/Interventions: Self-care/ADL training;Therapeutic exercise;Therapeutic activities;Patient/family education    OT Goals(Current goals can be found in the care plan  section) Acute Rehab OT Goals Patient Stated Goal: improve strength at rehab OT Goal Formulation: With patient Time For Goal Achievement: 11/25/22 Potential to Achieve Goals: Good  OT Frequency: Min 2X/week                  AM-PAC OT "6 Clicks" Daily Activity     Outcome Measure Help from another person eating meals?: A Little Help from another person taking care of personal grooming?: A Lot Help from another person toileting, which includes using toliet, bedpan, or urinal?: A Lot Help from another person bathing (including washing, rinsing, drying)?: A Lot Help from another person to put on and taking off regular upper body clothing?: A Little Help from another person to put on and taking off regular lower body clothing?: A Lot 6 Click Score: 14   End of Session Equipment Utilized During Treatment: Rolling walker (2 wheels);Gait belt Nurse Communication: Other (comment) [employer] desk notified that pt needed purewick placed.)  Activity Tolerance: Patient tolerated treatment well Patient left: in bed;with call bell/phone within reach;with bed alarm set  OT Visit Diagnosis: Unsteadiness on feet (R26.81);Other abnormalities of gait and mobility (R26.89);Muscle weakness (generalized) (M62.81);History of falling (Z91.81)  Time: 1610-9604 OT Time Calculation (min): 25 min Charges:  OT General Charges $OT Visit: 1 Visit OT Evaluation $OT Eval Low Complexity: 1 Low  Phylis Javed OT, MOT   Danie Chandler 11/11/2022, 10:38 AM

## 2022-11-11 NOTE — TOC Transition Note (Signed)
Transition of Care [facility]) - CM/SW Discharge Note   Patient Details  Name: Wendy Robles MRN: [id number] Date of Birth: 30-Apr-1929  [facility]) CM/SW Contact:  Elliot Gault, LCSW Phone Number: 11/11/2022, 12:33 PM   Clinical Narrative:     TOC following. Spoke with pt, niece, and ALF reps this AM. The consensus at this time is that pt needs SNF rehab before she can transition to ALF. TOC followed up with additional SNF options at pt request and she has now accepted bed offer from [facility]. Niece aware.  Pt's insurance updated on change of facility.  DC clinical sent electronically. RN to call report. EMS arranged.  No other TOC needs for dc.  Final next level of care: Skilled Nursing Facility Barriers to Discharge: Barriers Resolved   Patient Goals and CMS Choice CMS Medicare.gov Compare Post Acute Care list provided to:: Patient    Discharge Placement                Patient chooses bed at: [facility] Patient to be transferred to facility by: EMS Name of family member notified: Tonya Patient and family notified of of transfer: 11/11/22  Discharge Plan and Services Additional resources added to the After Visit Summary for                                       Social Determinants of Health (SDOH) Interventions SDOH Screenings   Food Insecurity: No Food Insecurity (11/09/2022)  Housing: Low Risk  (11/09/2022)  Transportation Needs: No Transportation Needs (11/09/2022)  Utilities: Not At Risk (11/09/2022)  Alcohol Screen: Low Risk  (01/17/2022)  Depression (PHQ2-9): Low Risk  (04/12/2022)  Financial Resource Strain: Low Risk  (01/17/2022)  Physical Activity: Inactive (01/17/2022)  Social Connections: Moderately Integrated (01/17/2022)  Stress: No Stress Concern Present (01/17/2022)  Tobacco Use: Low Risk  (11/09/2022)     Readmission Risk Interventions     No data to display

## 2022-11-11 NOTE — Discharge Summary (Signed)
Physician Discharge Summary   Patient: Wendy Robles MRN: [id number] DOB: 1929/05/29  Admit date:     11/09/2022  Discharge date: 11/11/22  Discharge Physician: Kendell Bane   PCP: Bennie Pierini, FNP   Recommendations at discharge:   With the PCP in 1-2 weeks Continue current recommended antibiotics PT / OT, fall precautions No further need for isolation,  Discharge Diagnoses: Principal Problem:   UTI (urinary tract infection) Active Problems:   Hyperlipidemia with target LDL less than 100   Hypertension    Wendy Robles is a 89 y.o. female, with past medical history of hypertension, hyperlipidemia, osteoporosis, presented to ED secondary to fall.  Patient had been having generalized weakness for few days prior to admission.  She also mentioned dysuria and polyuria.  UA was noted to be abnormal.  She was started on antibiotics and was hospitalized for further management.   Urinary tract infection -Has been on IV antibiotic with Rocephin, UA indicative of UTI, cultures growing gram-negative rods Switch to p.o. antibiotics of ciprofloxacin   Generalized weakness/fall Most likely secondary to UTI.  PT OT.   SNF is recommended for short-term rehab.    Hyponatremia/hypokalemia Resolved Potassium was repleted.  Encourage oral intake.    Magnesium level 2.0.   Rash in the right flank area/herpes zoster Has been ongoing for about 3 weeks.  Possible herpes zoster though no active lesions noted.  The rash is very dry.   Does not appear to be active shingles.  Negative for any rash currently no vesicular lesions, No further need for treatment or isolation   Normocytic anemia/thrombocytopenia Likely dilutional drop in hemoglobin. H&H remained stable  Elevated blood pressure readings Does not appear to have a diagnosis of hypertension.  Not on any blood pressure lowering agents at home.  Blood pressure has improved.  Continue to monitor. May need medication for  better BP control in near future    Hyperlipidemia Continue Crestor   Obesity Estimated body mass index is 32.61 kg/m as calculated from the following:   Height as of this encounter: 5\' 4"  (1.626 m).   Weight as of this encounter: 86.2 kg.    Disposition: Skilled nursing facility Diet recommendation:  Discharge Diet Orders (From admission, onward)     Start     Ordered   11/11/22 0000  Diet - low sodium heart healthy        11/11/22 1006           Regular diet DISCHARGE MEDICATION: Allergies as of 11/11/2022       Reactions   Neurontin [gabapentin]    Bactrim [sulfamethoxazole-trimethoprim] Rash        Medication List     TAKE these medications    aspirin 81 MG tablet Take 81 mg by mouth daily.   ciprofloxacin 500 MG tablet Commonly known as: CIPRO Take 1 tablet (500 mg total) by mouth 2 (two) times daily for 5 days.   diclofenac Sodium 1 % Gel Commonly known as: Voltaren Apply 4 g topically 4 (four) times daily.   docusate sodium 100 MG capsule Commonly known as: Colace Take 1 capsule (100 mg total) by mouth 2 (two) times daily.   ibuprofen 200 MG tablet Commonly known as: ADVIL Take 400 mg by mouth in the morning and at bedtime.   lactobacillus acidophilus & bulgar chewable tablet Chew 1 tablet by mouth 3 (three) times daily with meals for 7 days.   omega-3 acid ethyl esters 1 g capsule Commonly  known as: LOVAZA Take 2 capsules (2 g total) by mouth 2 (two) times daily.   polyethylene glycol 17 g packet Commonly known as: MIRALAX / GLYCOLAX Take 17 g by mouth as needed.   rosuvastatin 20 MG tablet Commonly known as: CRESTOR Take 1 tablet (20 mg total) by mouth daily.   Vitamin D 50 MCG (2000 UT) Caps Take 1 capsule 2 (two) times daily by mouth.        Contact information for after-discharge care     Destination     [facility]erred [facility] .   Service: Skilled Nursing Contact information: [address] [place] [address] [phone]                    Discharge Exam: Ceasar Mons Weights   11/09/22 1050  Weight: 86.2 kg     General:  AAO x 3,  cooperative, no distress;   HEENT:  Normocephalic, PERRL, otherwise with in Normal limits   Neuro:  CNII-XII intact. , normal motor and sensation, reflexes intact   Lungs:   Clear to auscultation BL, Respirations unlabored,  No wheezes / crackles  Cardio:    S1/S2, RRR, No murmure, No Rubs or Gallops   Abdomen:  Soft, non-tender, bowel sounds active all four quadrants, no guarding or peritoneal signs.  Muscular  skeletal:  Limited exam -global generalized weaknesses - in bed, able to move all 4 extremities,   2+ pulses,  symmetric, No pitting edema  Skin:  Dry, warm to touch, negative for any Rashes,  Wounds: Please see nursing documentation     Condition at discharge: Stable  The results of significant diagnostics from this hospitalization (including imaging, microbiology, ancillary and laboratory) are listed below for reference.   Imaging Studies: DG Pelvis 1-2 Views  Result Date: 11/09/2022 CLINICAL DATA:  Lateral left leg bruising and abrasion following a fall. EXAM: PELVIS - 1-2 VIEW COMPARISON:  None Available. FINDINGS: Mild-to-moderate bilateral hip degenerative changes. No fracture or dislocation seen. Diffuse osteopenia. IMPRESSION: 1. No fracture. 2. Mild-to-moderate bilateral hip degenerative changes. Electronically Signed   By: Beckie Salts M.D.   On: 11/09/2022 11:07   DG Tibia/Fibula Left  Result Date: 11/09/2022 CLINICAL DATA:  Fall.  Left leg bruising and abrasion.  Weakness. EXAM: LEFT TIBIA AND FIBULA - 2 VIEW COMPARISON:  None Available. FINDINGS: No acute fracture is identified. The knee and ankle are located. There is prominent chondrocalcinosis at the knee involving the medial and lateral compartments. Atherosclerotic vascular calcification is noted. IMPRESSION: No acute osseous abnormality  identified. Electronically Signed   By: Sebastian Ache M.D.   On: 11/09/2022 11:05   CT Head Wo Contrast  Result Date: 11/09/2022 CLINICAL DATA:  Head trauma, minor (Age >= 65y) EXAM: CT HEAD WITHOUT CONTRAST TECHNIQUE: Contiguous axial images were obtained from the base of the skull through the vertex without intravenous contrast. RADIATION DOSE REDUCTION: This exam was performed according to the departmental dose-optimization program which includes automated exposure control, adjustment of the mA and/or kV according to patient size and/or use of iterative reconstruction technique. COMPARISON:  None Available. FINDINGS: Brain: No evidence of acute infarction, hemorrhage, hydrocephalus, extra-axial collection or mass lesion/mass effect. There is bilateral periventricular hypodensity, which is non-specific but most likely seen in the settings of microvascular ischemic changes. Mild in extent. Vascular: No hyperdense vessel or unexpected calcification. Intracranial arteriosclerosis. Skull: Normal. Negative for fracture or focal lesion. Sinuses/Orbits: No acute finding. Other: None. IMPRESSION: 1. No acute  intracranial abnormality. No calvarial fracture. 2. Mild microvascular ischemic changes. Electronically Signed   By: Jules Schick M.D.   On: 11/09/2022 10:23   DG Chest Portable 1 View  Result Date: 11/09/2022 CLINICAL DATA:  Fall.  Rash on left flank side. EXAM: PORTABLE CHEST 1 VIEW COMPARISON:  None Available. FINDINGS: Bilateral lung fields are clear. No pneumothorax, lung mass, consolidation or lung collapse. Note is made of elevated right hemidiaphragm. Bilateral lateral costophrenic angles are clear. Normal cardio-mediastinal silhouette. No acute osseous abnormalities. No acute displaced rib fracture seen. The soft tissues are within normal limits. IMPRESSION: 1. No active cardiopulmonary disease. 2. Elevated right hemidiaphragm. Electronically Signed   By: Jules Schick M.D.   On: 11/09/2022 10:19     Microbiology: Results for orders placed or performed during the hospital encounter of 11/09/22  Urine Culture     Status: Abnormal (Preliminary result)   Collection Time: 11/09/22  9:40 AM   Specimen: Urine, Random  Result Value Ref Range Status   Specimen Description   Final    URINE, RANDOM Performed at [facility], [address], [place], [place] [address]    Special Requests   Final    NONE Performed at [facility], [address] [place], [place] [address]    Culture (A)  Final    >=100,000 COLONIES/mL GRAM NEGATIVE RODS IDENTIFICATION AND SUSCEPTIBILITIES TO FOLLOW Performed at [facility], [address] [place], [place] [address]    Report Status PENDING  Incomplete    Labs: CBC: Recent Labs  Lab 11/09/22 0907 11/10/22 0449 11/11/22 0414  WBC 12.8* 11.1* 8.3  HGB 13.2 11.7* 12.0  HCT 40.6 36.5 37.4  MCV 94.4 93.4 93.7  PLT 155 149* 167   Basic Metabolic Panel: Recent Labs  Lab 11/09/22 0907 11/10/22 0449 11/11/22 0414  NA 138 132* 134*  K 3.8 3.0* 4.0  CL 101 100 100  CO2 26 23 27   GLUCOSE 116* 129* 120*  BUN 25* 24* 20  CREATININE 0.62 0.55 0.61  CALCIUM 9.4 8.6* 8.8*  MG  --  2.0  --    Liver Function Tests: Recent Labs  Lab 11/09/22 0935  AST 24  ALT 22  ALKPHOS 44  BILITOT 1.2  PROT 6.6  ALBUMIN 3.5   CBG: No results for input(s): "GLUCAP" in the last 168 hours.  Discharge time spent: greater than 40 minutes.  Signed: Kendell Bane, MD [facility] 11/11/2022

## 2022-11-11 NOTE — Plan of Care (Signed)
  Problem: Coping: Goal: Level of anxiety will decrease Outcome: Progressing   Problem: Pain Managment: Goal: General experience of comfort will improve Outcome: Progressing   

## 2022-11-11 NOTE — Plan of Care (Signed)
  Problem: Acute Rehab OT Goals (only OT should resolve) Goal: Pt. Will Perform Grooming Flowsheets (Taken 11/11/2022 1040) Pt Will Perform Grooming:  with set-up  sitting Goal: Pt. Will Perform Upper Body Dressing Flowsheets (Taken 11/11/2022 1040) Pt Will Perform Upper Body Dressing:  with set-up  sitting Goal: Pt. Will Perform Lower Body Dressing Flowsheets (Taken 11/11/2022 1040) Pt Will Perform Lower Body Dressing:  sitting/lateral leans  with set-up  with min assist Goal: Pt. Will Transfer To Toilet Flowsheets (Taken 11/11/2022 1040) Pt Will Transfer to Toilet:  with min assist  with mod assist  stand pivot transfer Goal: Pt. Will Perform Toileting-Clothing Manipulation Flowsheets (Taken 11/11/2022 1040) Pt Will Perform Toileting - Clothing Manipulation and hygiene:  with mod assist  sitting/lateral leans Goal: Pt/Caregiver Will Perform Home Exercise Program Flowsheets (Taken 11/11/2022 1040) Pt/caregiver will Perform Home Exercise Program:  Increased ROM  Increased strength  Both right and left upper extremity  Independently  Sabiha Sura OT, MOT

## 2022-11-11 NOTE — Progress Notes (Signed)
Report called and given to nurse Delice Bison at [facility]. Pt is resting comfortably in room awaiting transport.

## 2022-11-13 DIAGNOSIS — N390 Urinary tract infection, site not specified: Secondary | ICD-10-CM | POA: Diagnosis not present

## 2022-11-13 DIAGNOSIS — G629 Polyneuropathy, unspecified: Secondary | ICD-10-CM | POA: Diagnosis not present

## 2022-11-13 DIAGNOSIS — K219 Gastro-esophageal reflux disease without esophagitis: Secondary | ICD-10-CM | POA: Diagnosis not present

## 2022-11-13 DIAGNOSIS — E7849 Other hyperlipidemia: Secondary | ICD-10-CM | POA: Diagnosis not present

## 2022-11-13 DIAGNOSIS — R531 Weakness: Secondary | ICD-10-CM | POA: Diagnosis not present

## 2022-11-29 ENCOUNTER — Ambulatory Visit: Payer: Medicare HMO | Admitting: Nurse Practitioner

## 2022-12-25 DIAGNOSIS — K219 Gastro-esophageal reflux disease without esophagitis: Secondary | ICD-10-CM | POA: Diagnosis not present

## 2022-12-25 DIAGNOSIS — N390 Urinary tract infection, site not specified: Secondary | ICD-10-CM | POA: Diagnosis not present

## 2022-12-25 DIAGNOSIS — G629 Polyneuropathy, unspecified: Secondary | ICD-10-CM | POA: Diagnosis not present

## 2022-12-25 DIAGNOSIS — R531 Weakness: Secondary | ICD-10-CM | POA: Diagnosis not present

## 2022-12-25 DIAGNOSIS — E7849 Other hyperlipidemia: Secondary | ICD-10-CM | POA: Diagnosis not present

## 2023-01-21 DIAGNOSIS — G629 Polyneuropathy, unspecified: Secondary | ICD-10-CM | POA: Diagnosis not present

## 2023-01-21 DIAGNOSIS — E7849 Other hyperlipidemia: Secondary | ICD-10-CM | POA: Diagnosis not present

## 2023-01-21 DIAGNOSIS — N390 Urinary tract infection, site not specified: Secondary | ICD-10-CM | POA: Diagnosis not present

## 2023-01-21 DIAGNOSIS — K219 Gastro-esophageal reflux disease without esophagitis: Secondary | ICD-10-CM | POA: Diagnosis not present

## 2023-01-21 DIAGNOSIS — R531 Weakness: Secondary | ICD-10-CM | POA: Diagnosis not present

## 2023-02-14 DIAGNOSIS — E7849 Other hyperlipidemia: Secondary | ICD-10-CM | POA: Diagnosis not present

## 2023-02-14 DIAGNOSIS — I2510 Atherosclerotic heart disease of native coronary artery without angina pectoris: Secondary | ICD-10-CM | POA: Diagnosis not present

## 2023-02-22 DIAGNOSIS — E7849 Other hyperlipidemia: Secondary | ICD-10-CM | POA: Diagnosis not present

## 2023-02-22 DIAGNOSIS — K219 Gastro-esophageal reflux disease without esophagitis: Secondary | ICD-10-CM | POA: Diagnosis not present

## 2023-02-22 DIAGNOSIS — N390 Urinary tract infection, site not specified: Secondary | ICD-10-CM | POA: Diagnosis not present

## 2023-02-22 DIAGNOSIS — R531 Weakness: Secondary | ICD-10-CM | POA: Diagnosis not present

## 2023-02-22 DIAGNOSIS — G629 Polyneuropathy, unspecified: Secondary | ICD-10-CM | POA: Diagnosis not present

## 2023-03-26 DIAGNOSIS — N390 Urinary tract infection, site not specified: Secondary | ICD-10-CM | POA: Diagnosis not present

## 2023-03-26 DIAGNOSIS — G629 Polyneuropathy, unspecified: Secondary | ICD-10-CM | POA: Diagnosis not present

## 2023-03-26 DIAGNOSIS — E7849 Other hyperlipidemia: Secondary | ICD-10-CM | POA: Diagnosis not present

## 2023-03-26 DIAGNOSIS — K219 Gastro-esophageal reflux disease without esophagitis: Secondary | ICD-10-CM | POA: Diagnosis not present

## 2023-03-26 DIAGNOSIS — R531 Weakness: Secondary | ICD-10-CM | POA: Diagnosis not present

## 2023-04-24 DIAGNOSIS — E7849 Other hyperlipidemia: Secondary | ICD-10-CM | POA: Diagnosis not present

## 2023-04-24 DIAGNOSIS — G629 Polyneuropathy, unspecified: Secondary | ICD-10-CM | POA: Diagnosis not present

## 2023-04-24 DIAGNOSIS — K219 Gastro-esophageal reflux disease without esophagitis: Secondary | ICD-10-CM | POA: Diagnosis not present

## 2023-04-24 DIAGNOSIS — R531 Weakness: Secondary | ICD-10-CM | POA: Diagnosis not present

## 2023-04-24 DIAGNOSIS — N390 Urinary tract infection, site not specified: Secondary | ICD-10-CM | POA: Diagnosis not present

## 2023-05-03 DIAGNOSIS — X58XXXA Exposure to other specified factors, initial encounter: Secondary | ICD-10-CM | POA: Diagnosis not present

## 2023-05-03 DIAGNOSIS — N898 Other specified noninflammatory disorders of vagina: Secondary | ICD-10-CM | POA: Diagnosis not present

## 2023-05-03 DIAGNOSIS — T8389XA Other specified complication of genitourinary prosthetic devices, implants and grafts, initial encounter: Secondary | ICD-10-CM | POA: Diagnosis not present

## 2023-05-22 DIAGNOSIS — M62561 Muscle wasting and atrophy, not elsewhere classified, right lower leg: Secondary | ICD-10-CM | POA: Diagnosis not present

## 2023-05-22 DIAGNOSIS — M62522 Muscle wasting and atrophy, not elsewhere classified, left upper arm: Secondary | ICD-10-CM | POA: Diagnosis not present

## 2023-05-22 DIAGNOSIS — R2689 Other abnormalities of gait and mobility: Secondary | ICD-10-CM | POA: Diagnosis not present

## 2023-05-22 DIAGNOSIS — M62562 Muscle wasting and atrophy, not elsewhere classified, left lower leg: Secondary | ICD-10-CM | POA: Diagnosis not present

## 2023-05-22 DIAGNOSIS — M62521 Muscle wasting and atrophy, not elsewhere classified, right upper arm: Secondary | ICD-10-CM | POA: Diagnosis not present

## 2023-05-22 DIAGNOSIS — R41841 Cognitive communication deficit: Secondary | ICD-10-CM | POA: Diagnosis not present

## 2023-05-23 DIAGNOSIS — M62521 Muscle wasting and atrophy, not elsewhere classified, right upper arm: Secondary | ICD-10-CM | POA: Diagnosis not present

## 2023-05-23 DIAGNOSIS — M62522 Muscle wasting and atrophy, not elsewhere classified, left upper arm: Secondary | ICD-10-CM | POA: Diagnosis not present

## 2023-05-23 DIAGNOSIS — M62561 Muscle wasting and atrophy, not elsewhere classified, right lower leg: Secondary | ICD-10-CM | POA: Diagnosis not present

## 2023-05-23 DIAGNOSIS — R2689 Other abnormalities of gait and mobility: Secondary | ICD-10-CM | POA: Diagnosis not present

## 2023-05-23 DIAGNOSIS — R41841 Cognitive communication deficit: Secondary | ICD-10-CM | POA: Diagnosis not present

## 2023-05-23 DIAGNOSIS — M62562 Muscle wasting and atrophy, not elsewhere classified, left lower leg: Secondary | ICD-10-CM | POA: Diagnosis not present

## 2023-05-24 DIAGNOSIS — M62561 Muscle wasting and atrophy, not elsewhere classified, right lower leg: Secondary | ICD-10-CM | POA: Diagnosis not present

## 2023-05-24 DIAGNOSIS — M62522 Muscle wasting and atrophy, not elsewhere classified, left upper arm: Secondary | ICD-10-CM | POA: Diagnosis not present

## 2023-05-24 DIAGNOSIS — M62562 Muscle wasting and atrophy, not elsewhere classified, left lower leg: Secondary | ICD-10-CM | POA: Diagnosis not present

## 2023-05-24 DIAGNOSIS — R41841 Cognitive communication deficit: Secondary | ICD-10-CM | POA: Diagnosis not present

## 2023-05-24 DIAGNOSIS — R2689 Other abnormalities of gait and mobility: Secondary | ICD-10-CM | POA: Diagnosis not present

## 2023-05-24 DIAGNOSIS — M62521 Muscle wasting and atrophy, not elsewhere classified, right upper arm: Secondary | ICD-10-CM | POA: Diagnosis not present

## 2023-05-25 DIAGNOSIS — M62561 Muscle wasting and atrophy, not elsewhere classified, right lower leg: Secondary | ICD-10-CM | POA: Diagnosis not present

## 2023-05-25 DIAGNOSIS — N390 Urinary tract infection, site not specified: Secondary | ICD-10-CM | POA: Diagnosis not present

## 2023-05-25 DIAGNOSIS — M62521 Muscle wasting and atrophy, not elsewhere classified, right upper arm: Secondary | ICD-10-CM | POA: Diagnosis not present

## 2023-05-25 DIAGNOSIS — R2689 Other abnormalities of gait and mobility: Secondary | ICD-10-CM | POA: Diagnosis not present

## 2023-05-25 DIAGNOSIS — M62522 Muscle wasting and atrophy, not elsewhere classified, left upper arm: Secondary | ICD-10-CM | POA: Diagnosis not present

## 2023-05-25 DIAGNOSIS — E7849 Other hyperlipidemia: Secondary | ICD-10-CM | POA: Diagnosis not present

## 2023-05-25 DIAGNOSIS — G629 Polyneuropathy, unspecified: Secondary | ICD-10-CM | POA: Diagnosis not present

## 2023-05-25 DIAGNOSIS — K219 Gastro-esophageal reflux disease without esophagitis: Secondary | ICD-10-CM | POA: Diagnosis not present

## 2023-05-25 DIAGNOSIS — R531 Weakness: Secondary | ICD-10-CM | POA: Diagnosis not present

## 2023-05-25 DIAGNOSIS — M62562 Muscle wasting and atrophy, not elsewhere classified, left lower leg: Secondary | ICD-10-CM | POA: Diagnosis not present

## 2023-05-25 DIAGNOSIS — R41841 Cognitive communication deficit: Secondary | ICD-10-CM | POA: Diagnosis not present

## 2023-05-26 DIAGNOSIS — R41841 Cognitive communication deficit: Secondary | ICD-10-CM | POA: Diagnosis not present

## 2023-05-26 DIAGNOSIS — M62561 Muscle wasting and atrophy, not elsewhere classified, right lower leg: Secondary | ICD-10-CM | POA: Diagnosis not present

## 2023-05-26 DIAGNOSIS — M62522 Muscle wasting and atrophy, not elsewhere classified, left upper arm: Secondary | ICD-10-CM | POA: Diagnosis not present

## 2023-05-26 DIAGNOSIS — R2689 Other abnormalities of gait and mobility: Secondary | ICD-10-CM | POA: Diagnosis not present

## 2023-05-26 DIAGNOSIS — M62562 Muscle wasting and atrophy, not elsewhere classified, left lower leg: Secondary | ICD-10-CM | POA: Diagnosis not present

## 2023-05-26 DIAGNOSIS — M62521 Muscle wasting and atrophy, not elsewhere classified, right upper arm: Secondary | ICD-10-CM | POA: Diagnosis not present

## 2023-06-22 DIAGNOSIS — K219 Gastro-esophageal reflux disease without esophagitis: Secondary | ICD-10-CM | POA: Diagnosis not present

## 2023-06-22 DIAGNOSIS — E7849 Other hyperlipidemia: Secondary | ICD-10-CM | POA: Diagnosis not present

## 2023-06-22 DIAGNOSIS — N390 Urinary tract infection, site not specified: Secondary | ICD-10-CM | POA: Diagnosis not present

## 2023-06-22 DIAGNOSIS — R531 Weakness: Secondary | ICD-10-CM | POA: Diagnosis not present

## 2023-06-22 DIAGNOSIS — G629 Polyneuropathy, unspecified: Secondary | ICD-10-CM | POA: Diagnosis not present

## 2023-07-22 DIAGNOSIS — R531 Weakness: Secondary | ICD-10-CM | POA: Diagnosis not present

## 2023-07-22 DIAGNOSIS — E7849 Other hyperlipidemia: Secondary | ICD-10-CM | POA: Diagnosis not present

## 2023-07-22 DIAGNOSIS — K219 Gastro-esophageal reflux disease without esophagitis: Secondary | ICD-10-CM | POA: Diagnosis not present

## 2023-07-22 DIAGNOSIS — G629 Polyneuropathy, unspecified: Secondary | ICD-10-CM | POA: Diagnosis not present

## 2023-08-03 DIAGNOSIS — M62561 Muscle wasting and atrophy, not elsewhere classified, right lower leg: Secondary | ICD-10-CM | POA: Diagnosis not present

## 2023-08-03 DIAGNOSIS — R2689 Other abnormalities of gait and mobility: Secondary | ICD-10-CM | POA: Diagnosis not present

## 2023-08-03 DIAGNOSIS — M62562 Muscle wasting and atrophy, not elsewhere classified, left lower leg: Secondary | ICD-10-CM | POA: Diagnosis not present

## 2023-08-13 DIAGNOSIS — I2510 Atherosclerotic heart disease of native coronary artery without angina pectoris: Secondary | ICD-10-CM | POA: Diagnosis not present

## 2023-08-23 DIAGNOSIS — G629 Polyneuropathy, unspecified: Secondary | ICD-10-CM | POA: Diagnosis not present

## 2023-08-23 DIAGNOSIS — K219 Gastro-esophageal reflux disease without esophagitis: Secondary | ICD-10-CM | POA: Diagnosis not present

## 2023-08-23 DIAGNOSIS — E7849 Other hyperlipidemia: Secondary | ICD-10-CM | POA: Diagnosis not present

## 2023-08-23 DIAGNOSIS — R531 Weakness: Secondary | ICD-10-CM | POA: Diagnosis not present

## 2023-08-31 DIAGNOSIS — R2689 Other abnormalities of gait and mobility: Secondary | ICD-10-CM | POA: Diagnosis not present

## 2023-08-31 DIAGNOSIS — M62561 Muscle wasting and atrophy, not elsewhere classified, right lower leg: Secondary | ICD-10-CM | POA: Diagnosis not present

## 2023-08-31 DIAGNOSIS — M62562 Muscle wasting and atrophy, not elsewhere classified, left lower leg: Secondary | ICD-10-CM | POA: Diagnosis not present

## 2023-09-22 ENCOUNTER — Encounter: Payer: Self-pay | Admitting: Radiology

## 2023-09-24 DIAGNOSIS — K219 Gastro-esophageal reflux disease without esophagitis: Secondary | ICD-10-CM | POA: Diagnosis not present

## 2023-09-24 DIAGNOSIS — G629 Polyneuropathy, unspecified: Secondary | ICD-10-CM | POA: Diagnosis not present

## 2023-09-24 DIAGNOSIS — R531 Weakness: Secondary | ICD-10-CM | POA: Diagnosis not present

## 2023-09-24 DIAGNOSIS — E7849 Other hyperlipidemia: Secondary | ICD-10-CM | POA: Diagnosis not present

## 2023-10-20 DIAGNOSIS — L603 Nail dystrophy: Secondary | ICD-10-CM | POA: Diagnosis not present

## 2023-10-20 DIAGNOSIS — L602 Onychogryphosis: Secondary | ICD-10-CM | POA: Diagnosis not present

## 2023-10-20 DIAGNOSIS — I739 Peripheral vascular disease, unspecified: Secondary | ICD-10-CM | POA: Diagnosis not present

## 2023-10-22 DIAGNOSIS — G629 Polyneuropathy, unspecified: Secondary | ICD-10-CM | POA: Diagnosis not present

## 2023-10-22 DIAGNOSIS — E7849 Other hyperlipidemia: Secondary | ICD-10-CM | POA: Diagnosis not present

## 2023-10-22 DIAGNOSIS — K219 Gastro-esophageal reflux disease without esophagitis: Secondary | ICD-10-CM | POA: Diagnosis not present

## 2023-10-22 DIAGNOSIS — R531 Weakness: Secondary | ICD-10-CM | POA: Diagnosis not present

## 2023-11-15 DIAGNOSIS — R2681 Unsteadiness on feet: Secondary | ICD-10-CM | POA: Diagnosis not present

## 2023-11-15 DIAGNOSIS — M6281 Muscle weakness (generalized): Secondary | ICD-10-CM | POA: Diagnosis not present

## 2023-11-15 DIAGNOSIS — R269 Unspecified abnormalities of gait and mobility: Secondary | ICD-10-CM | POA: Diagnosis not present

## 2023-11-15 DIAGNOSIS — R262 Difficulty in walking, not elsewhere classified: Secondary | ICD-10-CM | POA: Diagnosis not present

## 2023-11-21 DIAGNOSIS — K219 Gastro-esophageal reflux disease without esophagitis: Secondary | ICD-10-CM | POA: Diagnosis not present

## 2023-11-21 DIAGNOSIS — G629 Polyneuropathy, unspecified: Secondary | ICD-10-CM | POA: Diagnosis not present

## 2023-11-21 DIAGNOSIS — E7849 Other hyperlipidemia: Secondary | ICD-10-CM | POA: Diagnosis not present

## 2023-11-21 DIAGNOSIS — R531 Weakness: Secondary | ICD-10-CM | POA: Diagnosis not present

## 2023-11-29 DIAGNOSIS — I2510 Atherosclerotic heart disease of native coronary artery without angina pectoris: Secondary | ICD-10-CM | POA: Diagnosis not present

## 2023-12-04 ENCOUNTER — Encounter: Payer: Self-pay | Admitting: Radiology

## 2024-01-22 IMAGING — DX DG HIP (WITH OR WITHOUT PELVIS) 2-3V*L*
3 series · 3 of 3 positions shown · non-contrast
Comparison: None.

CLINICAL DATA: fall and left pelvic pain

EXAM:
DG HIP (WITH OR WITHOUT PELVIS) 2-3V LEFT

[pelvis ap]
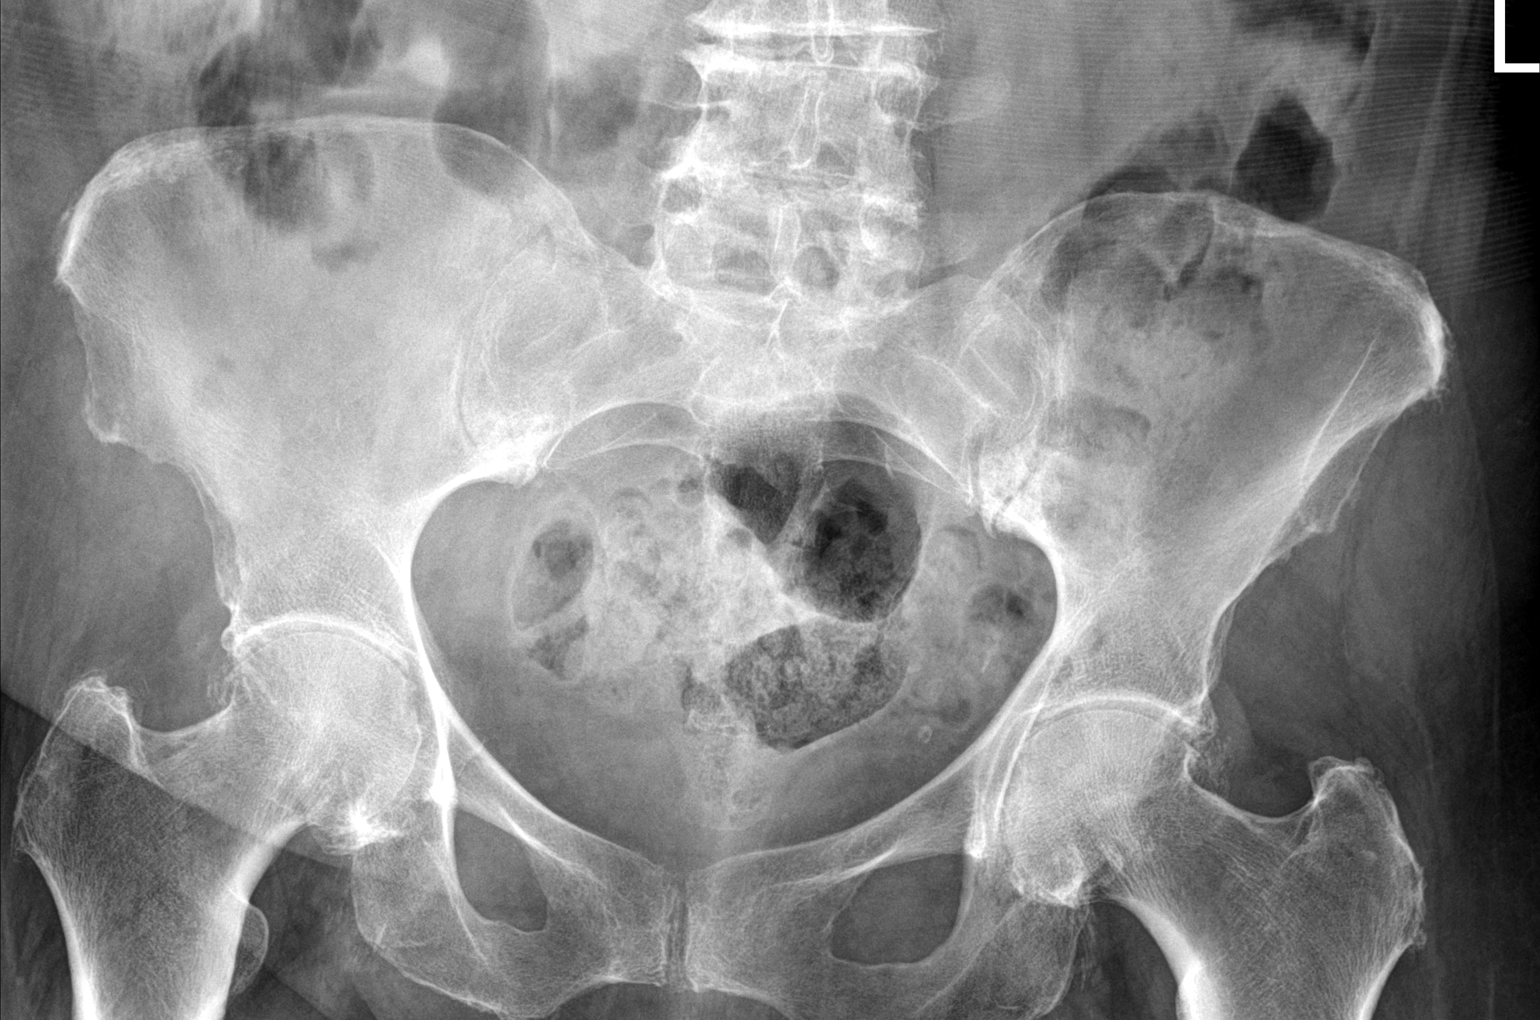

[hip ap]
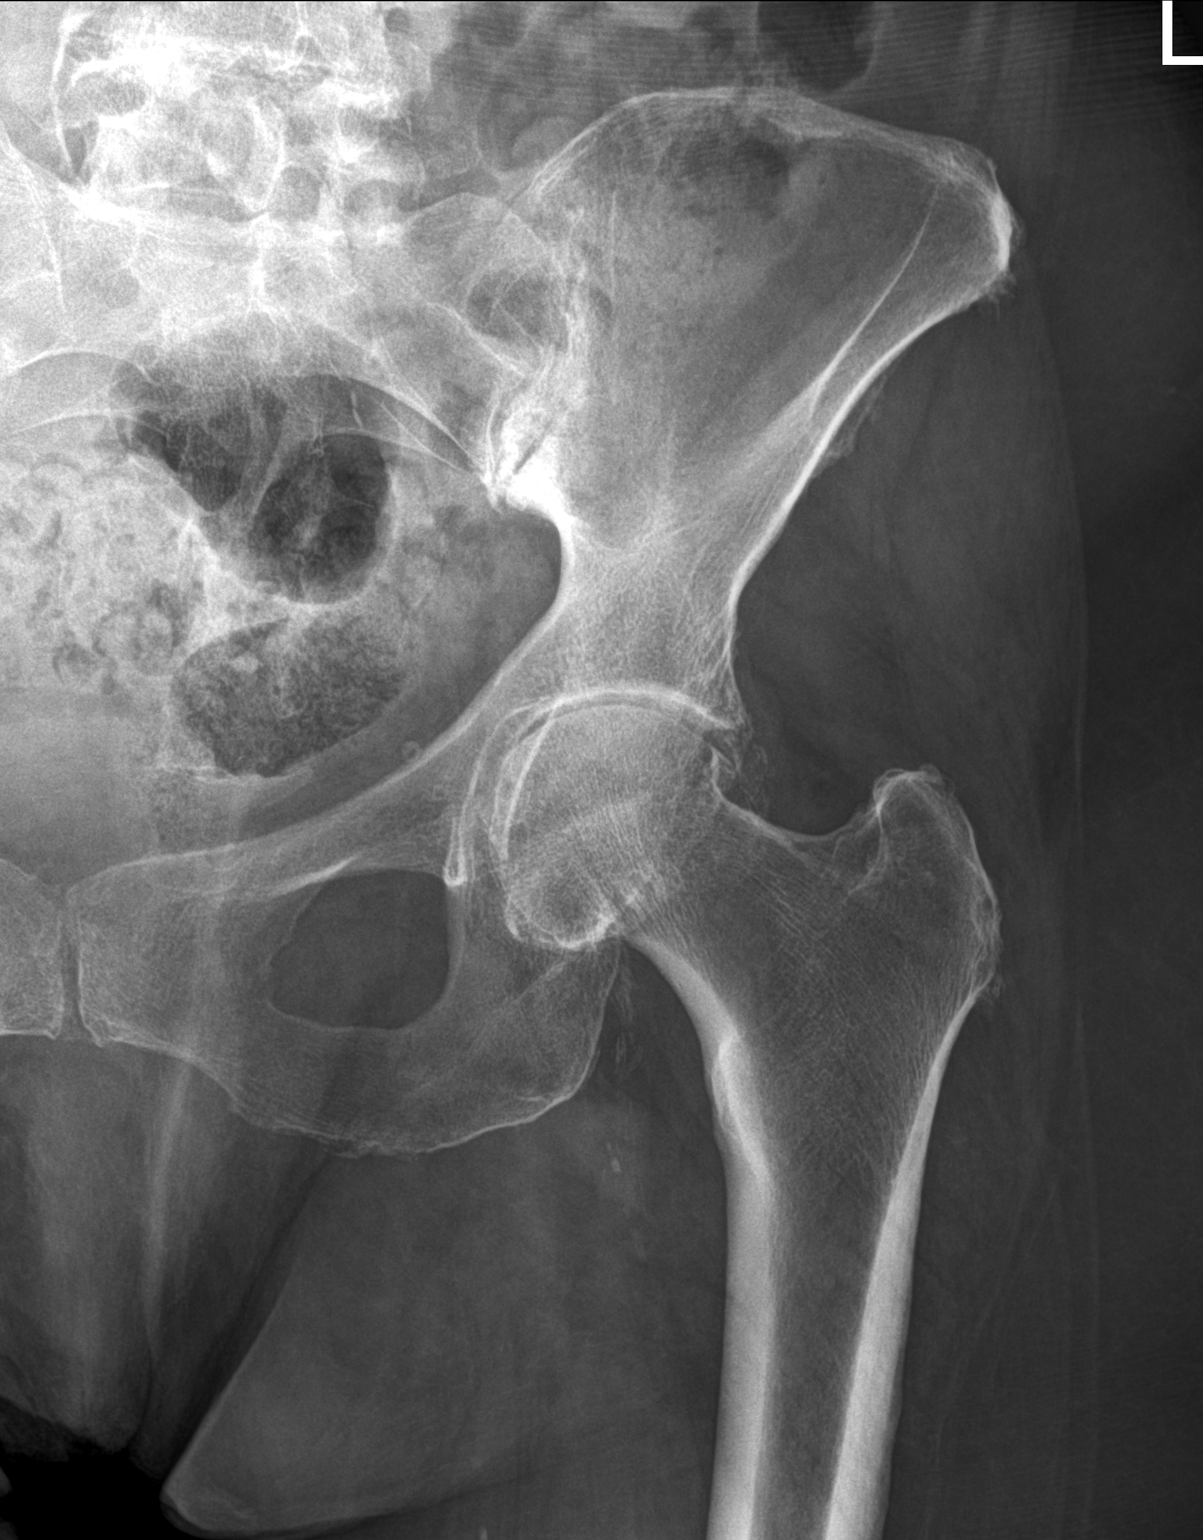

[hip lat]
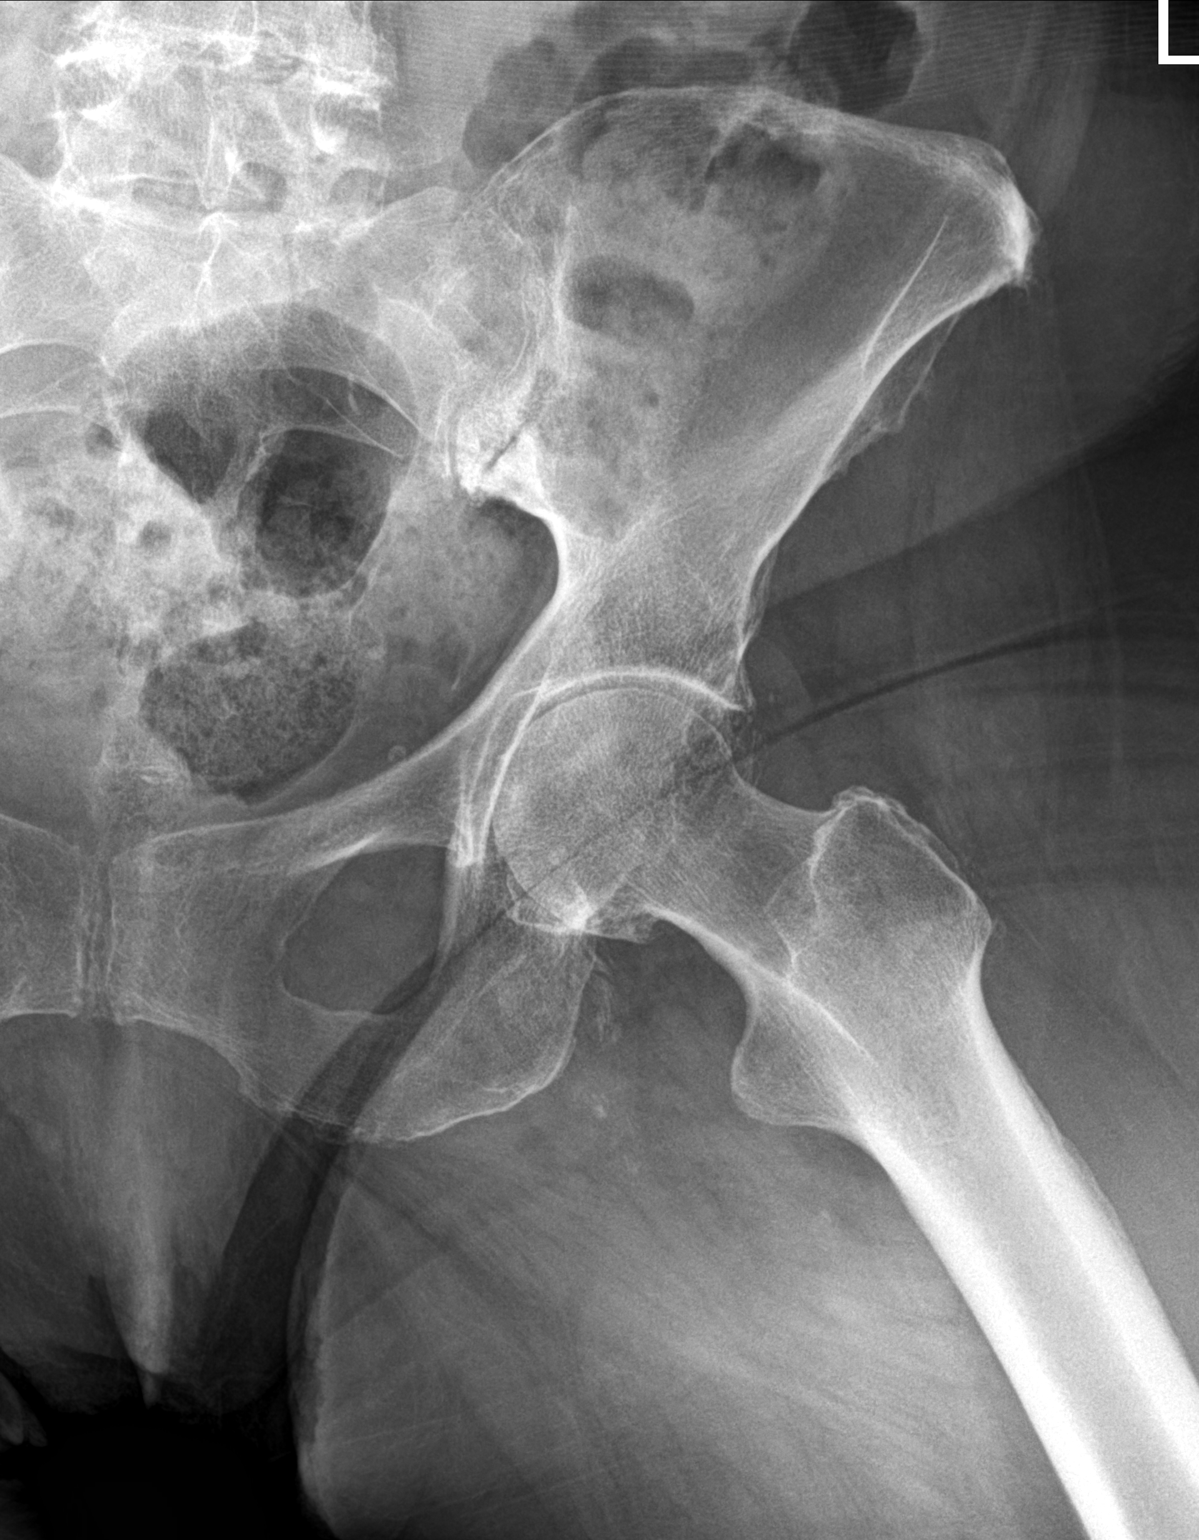

[3 of 3 positions shown; findings below may reference images not displayed]

FINDINGS: Hips are located. No evidence of pelvic fracture or sacral fracture.
Dedicated view of the LEFT hip demonstrates no femoral neck
fracture. There is a rim of osteophytes surrounding the LEFT and
RIGHT femoral necks which are symmetric. Medial joint space
narrowing. Mild sclerosis SI joints
IMPRESSION: 1. No evidence of femoral neck fracture.
2. Bilateral ring of osteophytosis surrounding the LEFT RIGHT
femoral neck.
3. Osteoarthritis of the hip joints.
4. If there is a high clinical suspicion of hip fracture (patient
refuses
To bear weight), MRI is preferred in cases of radiographically
Occult hip fracture, particularly in osteopenic patients. While CT
Is expeditious, evidence is lacking regarding accuracy over
Radiography.
